# Patient Record
Sex: Male | Born: 1945
Health system: Southern US, Community
[De-identification: ages and names within clinical notes are randomized; demographics above are authoritative.]

## PROBLEM LIST (undated history)

## (undated) DIAGNOSIS — R011 Cardiac murmur, unspecified: Secondary | ICD-10-CM

## (undated) DIAGNOSIS — E119 Type 2 diabetes mellitus without complications: Secondary | ICD-10-CM

## (undated) DIAGNOSIS — I10 Essential (primary) hypertension: Secondary | ICD-10-CM

## (undated) DIAGNOSIS — M1990 Unspecified osteoarthritis, unspecified site: Secondary | ICD-10-CM

## (undated) HISTORY — PX: CATARACT EXTRACTION: SUR2

## (undated) HISTORY — PX: FRACTURE SURGERY: SHX138

## (undated) HISTORY — DX: Unspecified osteoarthritis, unspecified site: M19.90

## (undated) HISTORY — PX: IRIDOTOMY / IRIDECTOMY: SHX165

## (undated) HISTORY — PX: HERNIA REPAIR: SHX51

## (undated) HISTORY — PX: VASECTOMY: SHX75

## (undated) HISTORY — DX: Type 2 diabetes mellitus without complications: E11.9

## (undated) HISTORY — DX: Cardiac murmur, unspecified: R01.1

---

## 1997-07-12 ENCOUNTER — Encounter: Admission: RE | Admit: 1997-07-12 | Discharge: 1997-10-10 | Payer: Self-pay | Admitting: Orthopedic Surgery

## 1997-09-06 ENCOUNTER — Ambulatory Visit (HOSPITAL_COMMUNITY): Admission: RE | Admit: 1997-09-06 | Discharge: 1997-09-06 | Payer: Self-pay | Admitting: Orthopedic Surgery

## 1997-09-25 ENCOUNTER — Encounter: Admission: RE | Admit: 1997-09-25 | Discharge: 1997-12-24 | Payer: Self-pay | Admitting: Orthopedic Surgery

## 1998-04-01 ENCOUNTER — Encounter: Payer: Self-pay | Admitting: Orthopedic Surgery

## 1998-04-02 ENCOUNTER — Ambulatory Visit (HOSPITAL_COMMUNITY): Admission: RE | Admit: 1998-04-02 | Discharge: 1998-04-02 | Payer: Self-pay | Admitting: Orthopedic Surgery

## 1998-08-05 ENCOUNTER — Ambulatory Visit (HOSPITAL_COMMUNITY): Admission: RE | Admit: 1998-08-05 | Discharge: 1998-08-05 | Payer: Self-pay | Admitting: Orthopedic Surgery

## 1998-08-05 ENCOUNTER — Encounter: Payer: Self-pay | Admitting: Orthopedic Surgery

## 1999-01-14 ENCOUNTER — Ambulatory Visit (HOSPITAL_COMMUNITY): Admission: RE | Admit: 1999-01-14 | Discharge: 1999-01-14 | Payer: Self-pay | Admitting: Orthopedic Surgery

## 2005-10-23 ENCOUNTER — Encounter: Admission: RE | Admit: 2005-10-23 | Discharge: 2005-10-23 | Payer: Self-pay | Admitting: Orthopedic Surgery

## 2012-01-27 ENCOUNTER — Ambulatory Visit (INDEPENDENT_AMBULATORY_CARE_PROVIDER_SITE_OTHER): Payer: 59 | Admitting: Family Medicine

## 2012-01-27 VITALS — BP 164/84 | HR 82 | Temp 98.3°F | Resp 16 | Ht 72.0 in | Wt 281.0 lb

## 2012-01-27 DIAGNOSIS — R040 Epistaxis: Secondary | ICD-10-CM

## 2012-01-27 NOTE — Patient Instructions (Addendum)

## 2012-01-27 NOTE — Progress Notes (Signed)
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  He has not had a nose bleed in two decades.  No allergies or high blood pressure, no anticoagulant therapy, no trauma  Objective:  NAD  162/82 Alert, cooperative Left septal clot identified, gently removed, and AgNO3 applied yielding a white eschar over septal arteriole.  AgNO3 reapplied and patient observed x 20 minutes. No further bleeding  Assessment:  Epistaxis, unclear etiology  Plan:  Return as necessary

## 2013-02-27 ENCOUNTER — Ambulatory Visit (INDEPENDENT_AMBULATORY_CARE_PROVIDER_SITE_OTHER): Payer: 59 | Admitting: Family Medicine

## 2013-02-27 VITALS — BP 150/96 | HR 90 | Temp 99.1°F | Resp 16 | Ht 72.0 in | Wt 260.0 lb

## 2013-02-27 DIAGNOSIS — I10 Essential (primary) hypertension: Secondary | ICD-10-CM

## 2013-02-27 DIAGNOSIS — Z23 Encounter for immunization: Secondary | ICD-10-CM

## 2013-02-27 DIAGNOSIS — E669 Obesity, unspecified: Secondary | ICD-10-CM | POA: Insufficient documentation

## 2013-02-27 DIAGNOSIS — M109 Gout, unspecified: Secondary | ICD-10-CM | POA: Insufficient documentation

## 2013-02-27 DIAGNOSIS — R358 Other polyuria: Secondary | ICD-10-CM

## 2013-02-27 DIAGNOSIS — E1169 Type 2 diabetes mellitus with other specified complication: Secondary | ICD-10-CM | POA: Insufficient documentation

## 2013-02-27 DIAGNOSIS — R634 Abnormal weight loss: Secondary | ICD-10-CM

## 2013-02-27 LAB — POCT GLYCOSYLATED HEMOGLOBIN (HGB A1C): Hemoglobin A1C: 11.1

## 2013-02-27 LAB — POCT URINALYSIS DIPSTICK
Bilirubin, UA: NEGATIVE
Glucose, UA: >1000
Ketones, UA: 15
Leukocytes, UA: NEGATIVE
Nitrite, UA: NEGATIVE
Protein, UA: 30
Spec Grav, UA: 1.02
Urobilinogen, UA: 0.2
pH, UA: 5

## 2013-02-27 LAB — COMPREHENSIVE METABOLIC PANEL
AST: 28 U/L (ref 0–37)
Albumin: 4.5 g/dL (ref 3.5–5.2)
Alkaline Phosphatase: 59 U/L (ref 39–117)
BUN: 21 mg/dL (ref 6–23)
CO2: 25 mEq/L (ref 19–32)
Calcium: 9.9 mg/dL (ref 8.4–10.5)
Chloride: 102 mEq/L (ref 96–112)
Glucose, Bld: 316 mg/dL — ABNORMAL HIGH (ref 70–99)
Potassium: 4.4 mEq/L (ref 3.5–5.3)
Sodium: 137 mEq/L (ref 135–145)
Total Protein: 7.8 g/dL (ref 6.0–8.3)

## 2013-02-27 LAB — POCT CBC
Granulocyte percent: 67.2 %G (ref 37–80)
HCT, POC: 50.3 % (ref 43.5–53.7)
Hemoglobin: 16.3 g/dL (ref 14.1–18.1)
Lymph, poc: 1.7 (ref 0.6–3.4)
MCH, POC: 32.5 pg — AB (ref 27–31.2)
MCHC: 32.4 g/dL (ref 31.8–35.4)
MCV: 100.4 fL — AB (ref 80–97)
MID (cbc): 0.3 (ref 0–0.9)
MPV: 7.9 fL (ref 0–99.8)
POC Granulocyte: 4 (ref 2–6.9)
POC LYMPH PERCENT: 27.7 %L (ref 10–50)
POC MID %: 5.1 %M (ref 0–12)
Platelet Count, POC: 237 10*3/uL (ref 142–424)
RBC: 5.01 M/uL (ref 4.69–6.13)
RDW, POC: 12.6 %
WBC: 6 10*3/uL (ref 4.6–10.2)

## 2013-02-27 LAB — LIPID PANEL
HDL: 42 mg/dL (ref >39)
LDL Cholesterol: 75 mg/dL (ref 0–99)
Triglycerides: 227 mg/dL — ABNORMAL HIGH (ref <150)

## 2013-02-27 LAB — POCT SEDIMENTATION RATE: POCT SED RATE: 38 mm/hr — AB (ref 0–22)

## 2013-02-27 LAB — GLUCOSE, POCT (MANUAL RESULT ENTRY): POC Glucose: 299 mg/dl — AB (ref 70–99)

## 2013-02-27 LAB — POCT UA - MICROSCOPIC ONLY
Bacteria, U Microscopic: NEGATIVE
Casts, Ur, LPF, POC: NEGATIVE
Crystals, Ur, HPF, POC: NEGATIVE
Epithelial cells, urine per micros: NEGATIVE
Mucus, UA: NEGATIVE
Yeast, UA: NEGATIVE

## 2013-02-27 LAB — URIC ACID: Uric Acid, Serum: 6.4 mg/dL (ref 4.0–7.8)

## 2013-02-27 LAB — TSH: TSH: 1.633 u[IU]/mL (ref 0.350–4.500)

## 2013-02-27 MED ORDER — INDOMETHACIN 50 MG PO CAPS: 50.0000 mg | ORAL_CAPSULE | Freq: Three times a day (TID) | ORAL | Status: DC

## 2013-02-27 MED ORDER — COLCHICINE 0.6 MG PO TABS: ORAL_TABLET | ORAL | Status: DC

## 2013-02-27 MED ORDER — BLOOD GLUCOSE MONITOR KIT: PACK | Status: DC

## 2013-02-27 MED ORDER — METFORMIN HCL 500 MG PO TABS: 500.0000 mg | ORAL_TABLET | Freq: Two times a day (BID) | ORAL | Status: DC

## 2013-02-27 MED ORDER — GLUCOSE BLOOD VI STRP: ORAL_STRIP | Status: DC

## 2013-02-27 MED ORDER — ASPIRIN EC 81 MG PO TBEC
81.0000 mg | DELAYED_RELEASE_TABLET | Freq: Every day | ORAL | Status: DC
Start: 2013-02-27 — End: 2021-09-12

## 2013-02-27 MED ORDER — LANCETS MISC: Status: DC

## 2013-02-27 NOTE — Patient Instructions (Addendum)
Take the colcrys today. You may also start the indomethacin today and continue this until your gout flair is completely gone - drop down to twice a day instead of three times a day when it has gotten significantly better. THen start the metformin tomorrow morning with breakfast. Both of these medications cause diarrhea but the colcrys diarrhea is worse - I expect the loose stools from metformin, if you have them, should resolve within 1-2 wks. Make an appointment to follow-up with myself or Dr. Neva Seat in less than a month so we can see if we need to increase your metformin or transition you to a different medication. Keep your appointment for your physical with Dr. Neva Seat in Jan in addition. Start taking a baby aspirin every day.  Diabetes and Foot Care Diabetes may cause you to have problems because of poor blood supply (circulation) to your feet and legs. This may cause the skin on your feet to become thinner, break easier, and heal more slowly. Your skin may become dry, and the skin may peel and crack. You may also have nerve damage in your legs and feet causing decreased feeling in them. You may not notice minor injuries to your feet that could lead to infections or more serious problems. Taking care of your feet is one of the most important things you can do for yourself.  HOME CARE INSTRUCTIONS  Wear shoes at all times, even in the house. Do not go barefoot. Bare feet are easily injured.  Check your feet daily for blisters, cuts, and redness. If you cannot see the bottom of your feet, use a mirror or ask someone for help.  Wash your feet with warm water (do not use hot water) and mild soap. Then pat your feet and the areas between your toes until they are completely dry. Do not soak your feet as this can dry your skin.  Apply a moisturizing lotion or petroleum jelly (that does not contain alcohol and is unscented) to the skin on your feet and to dry, brittle toenails. Do not apply lotion  between your toes.  Trim your toenails straight across. Do not dig under them or around the cuticle. File the edges of your nails with an emery board or nail file.  Do not cut corns or calluses or try to remove them with medicine.  Wear clean socks or stockings every day. Make sure they are not too tight. Do not wear knee-high stockings since they may decrease blood flow to your legs.  Wear shoes that fit properly and have enough cushioning. To break in new shoes, wear them for just a few hours a day. This prevents you from injuring your feet. Always look in your shoes before you put them on to be sure there are no objects inside.  Do not cross your legs. This may decrease the blood flow to your feet.  If you find a minor scrape, cut, or break in the skin on your feet, keep it and the skin around it clean and dry. These areas may be cleansed with mild soap and water. Do not cleanse the area with peroxide, alcohol, or iodine.  When you remove an adhesive bandage, be sure not to damage the skin around it.  If you have a wound, look at it several times a day to make sure it is healing.  Do not use heating pads or hot water bottles. They may burn your skin. If you have lost feeling in your feet or legs,  you may not know it is happening until it is too late.  Make sure your health care provider performs a complete foot exam at least annually or more often if you have foot problems. Report any cuts, sores, or bruises to your health care provider immediately. SEEK MEDICAL CARE IF:   You have an injury that is not healing.  You have cuts or breaks in the skin.  You have an ingrown nail.  You notice redness on your legs or feet.  You feel burning or tingling in your legs or feet.  You have pain or cramps in your legs and feet.  Your legs or feet are numb.  Your feet always feel cold. SEEK IMMEDIATE MEDICAL CARE IF:   There is increasing redness, swelling, or pain in or around a  wound.  There is a red line that goes up your leg.  Pus is coming from a wound.  You develop a fever or as directed by your health care provider.  You notice a bad smell coming from an ulcer or wound. Document Released: 03/27/2000 Document Revised: 11/30/2012 Document Reviewed: 09/06/2012 [facility] Patient Information 2014 [place], [place]. Diabetes and Standards of Medical Care  Diabetes is complicated. You may find that your diabetes team includes a dietitian, nurse, diabetes educator, eye doctor, and more. To help everyone know what is going on and to help you get the care you deserve, the following schedule of care was developed to help keep you on track. Below are the tests, exams, vaccines, medicines, education, and plans you will need. HbA1c test This test shows how well you have controlled your glucose over the past 2 3 months. It is used to see if your diabetes management plan needs to be adjusted.   It is performed at least 2 times a year if you are meeting treatment goals.  It is performed 4 times a year if therapy has changed or if you are not meeting treatment goals. Blood pressure test  This test is performed at every routine medical visit. The goal is less than 140/90 mmHg for most people, but 130/80 mmHg in some cases. Ask your health care provider about your goal. Dental exam  Follow up with the dentist regularly. Eye exam  If you are diagnosed with type 1 diabetes as a child, get an exam upon reaching the age of 10 years or older and have had diabetes for 3 5 years. Yearly eye exams are recommended after that initial eye exam.  If you are diagnosed with type 1 diabetes as an adult, get an exam within 5 years of diagnosis and then yearly.  If you are diagnosed with type 2 diabetes, get an exam as soon as possible after the diagnosis and then yearly. Foot care exam  Visual foot exams are performed at every routine medical visit. The exams check for cuts, injuries, or  other problems with the feet.  A comprehensive foot exam should be done yearly. This includes visual inspection as well as assessing foot pulses and testing for loss of sensation.  Check your feet nightly for cuts, injuries, or other problems with your feet. Tell your health care provider if anything is not healing. Kidney function test (urine microalbumin)  This test is performed once a year.  Type 1 diabetes: The first test is performed 5 years after diagnosis.  Type 2 diabetes: The first test is performed at the time of diagnosis.  A serum creatinine and estimated glomerular filtration rate (eGFR) test is  done once a year to assess the level of chronic kidney disease (CKD), if present. Lipid profile (cholesterol, HDL, LDL, triglycerides)  Performed every 5 years for most people.  The goal for LDL is less than 100 mg/dL. If you are at high risk, the goal is less than 70 mg/dL.  The goal for HDL is 40 mg/dL 50 mg/dL for men and 50 mg/dL 60 mg/dL for women. An HDL cholesterol of 60 mg/dL or higher gives some protection against heart disease.  The goal for triglycerides is less than 150 mg/dL. Influenza vaccine, pneumococcal vaccine, and hepatitis B vaccine  The influenza vaccine is recommended yearly.  The pneumococcal vaccine is generally given once in a lifetime. However, there are some instances when another vaccination is recommended. Check with your health care provider.  The hepatitis B vaccine is also recommended for adults with diabetes. Diabetes self-management education  Education is recommended at diagnosis and ongoing as needed. Treatment plan  Your treatment plan is reviewed at every medical visit. Document Released: 01/25/2009 Document Revised: 11/30/2012 Document Reviewed: 08/30/2012 [facility] Patient Information 2014 [place], [place]. Diabetes Meal Planning Guide The diabetes meal planning guide is a tool to help you plan your meals and snacks. It is important for  people with diabetes to manage their blood glucose (sugar) levels. Choosing the right foods and the right amounts throughout your day will help control your blood glucose. Eating right can even help you improve your blood pressure and reach or maintain a healthy weight. CARBOHYDRATE COUNTING MADE EASY When you eat carbohydrates, they turn to sugar. This raises your blood glucose level. Counting carbohydrates can help you control this level so you feel better. When you plan your meals by counting carbohydrates, you can have more flexibility in what you eat and balance your medicine with your food intake. Carbohydrate counting simply means adding up the total amount of carbohydrate grams in your meals and snacks. Try to eat about the same amount at each meal. Foods with carbohydrates are listed below. Each portion below is 1 carbohydrate serving or 15 grams of carbohydrates. Ask your dietician how many grams of carbohydrates you should eat at each meal or snack. Grains and Starches  1 slice bread.   English muffin or hotdog/hamburger bun.   cup cold cereal (unsweetened).   cup cooked pasta or rice.   cup starchy vegetables (corn, potatoes, peas, beans, winter squash).  1 tortilla (6 inches).   bagel.  1 waffle or pancake (size of a CD).   cup cooked cereal.  4 to 6 small crackers. *Whole grain is recommended. Fruit  1 cup fresh unsweetened berries, melon, papaya, pineapple.  1 small fresh fruit.   banana or mango.   cup fruit juice (4 oz unsweetened).   cup canned fruit in natural juice or water.  2 tbs dried fruit.  12 to 15 grapes or cherries. Milk and Yogurt  1 cup fat-free or 1% milk.  1 cup soy milk.  6 oz light yogurt with sugar-free sweetener.  6 oz low-fat soy yogurt.  6 oz plain yogurt. Vegetables  1 cup raw or  cup cooked is counted as 0 carbohydrates or a "free" food.  If you eat 3 or more servings at 1 meal, count them as 1 carbohydrate  serving. Other Carbohydrates   oz chips or pretzels.   cup ice cream or frozen yogurt.   cup sherbet or sorbet.  2 inch square cake, no frosting.  1 tbs honey, sugar, jam, jelly,  or syrup.  2 small cookies.  3 squares of graham crackers.  3 cups popcorn.  6 crackers.  1 cup broth-based soup.  Count 1 cup casserole or other mixed foods as 2 carbohydrate servings.  Foods with less than 20 calories in a serving may be counted as 0 carbohydrates or a "free" food. You may want to purchase a book or computer software that lists the carbohydrate gram counts of different foods. In addition, the nutrition facts panel on the labels of the foods you eat are a good source of this information. The label will tell you how big the serving size is and the total number of carbohydrate grams you will be eating per serving. Divide this number by 15 to obtain the number of carbohydrate servings in a portion. Remember, 1 carbohydrate serving equals 15 grams of carbohydrate. SERVING SIZES Measuring foods and serving sizes helps you make sure you are getting the right amount of food. The list below tells how big or small some common serving sizes are.  1 oz.........4 stacked dice.  3 oz........Marland KitchenDeck of cards.  1 tsp.......Marland KitchenTip of little finger.  1 tbs......Marland KitchenMarland KitchenThumb.  2 tbs.......Marland KitchenGolf ball.   cup......Marland KitchenHalf of a fist.  1 cup.......Marland KitchenA fist. SAMPLE DIABETES MEAL PLAN Below is a sample meal plan that includes foods from the grain and starches, dairy, vegetable, fruit, and meat groups. A dietician can individualize a meal plan to fit your calorie needs and tell you the number of servings needed from each food group. However, controlling the total amount of carbohydrates in your meal or snack is more important than making sure you include all of the food groups at every meal. You may interchange carbohydrate containing foods (dairy, starches, and fruits). The meal plan below is an example of a  2000 calorie diet using carbohydrate counting. This meal plan has 17 carbohydrate servings. Breakfast  1 cup oatmeal (2 carb servings).   cup light yogurt (1 carb serving).  1 cup blueberries (1 carb serving).   cup almonds. Snack  1 large apple (2 carb servings).  1 low-fat string cheese stick. Lunch  Chicken breast salad.  1 cup spinach.   cup chopped tomatoes.  2 oz chicken breast, sliced.  2 tbs low-fat [place] dressing.  12 whole-wheat crackers (2 carb servings).  12 to 15 grapes (1 carb serving).  1 cup low-fat milk (1 carb serving). Snack  1 cup carrots.   cup hummus (1 carb serving). Dinner  3 oz broiled salmon.  1 cup brown rice (3 carb servings). Snack  1  cups steamed broccoli (1 carb serving) drizzled with 1 tsp olive oil and lemon juice.  1 cup light pudding (2 carb servings). DIABETES MEAL PLANNING WORKSHEET Your dietician can use this worksheet to help you decide how many servings of foods and what types of foods are right for you.  BREAKFAST Food Group and Servings / Carb Servings Grain/Starches __________________________________ Dairy __________________________________________ Vegetable ______________________________________ Fruit ___________________________________________ Meat __________________________________________ Fat ____________________________________________ LUNCH Food Group and Servings / Carb Servings Grain/Starches ___________________________________ Dairy ___________________________________________ Fruit ____________________________________________ Meat ___________________________________________ Fat _____________________________________________ Laural Golden Food Group and Servings / Carb Servings Grain/Starches ___________________________________ Dairy ___________________________________________ Fruit ____________________________________________ Meat ___________________________________________ Fat  _____________________________________________ SNACKS Food Group and Servings / Carb Servings Grain/Starches ___________________________________ Dairy ___________________________________________ Vegetable _______________________________________ Fruit ____________________________________________ Meat ___________________________________________ Fat _____________________________________________ DAILY TOTALS Starches _________________________ Vegetable ________________________ Fruit ____________________________ Dairy ____________________________ Meat ____________________________ Fat ______________________________ Document Released: 12/25/2004 Document Revised: 06/22/2011 Document Reviewed: 11/05/2008 [employer] Patient Information 2014 [place]. Diabetes, Eating Away  From Home Sometimes, you might eat in a restaurant or have meals that are prepared by someone else. You can enjoy eating out. However, the portions in restaurants may be much larger than needed. Listed below are some ideas to help you choose foods that will keep your blood glucose (sugar) in better control.  TIPS FOR EATING OUT  Know your meal plan and how many carbohydrate servings you should have at each meal. You may wish to carry a copy of your meal plan in your purse or wallet. Learn the foods included in each food group.  Make a list of restaurants near you that offer healthy choices. Take a copy of the carry-out menus to see what they offer. Then, you can plan what you will order ahead of time.  Become familiar with serving sizes by practicing them at home using measuring cups and spoons. Once you learn to recognize portion sizes, you will be able to correctly estimate the amount of total carbohydrate you are allowed to eat at the restaurant. Ask for a takeout box if the portion is more than you should have. When your food comes, leave the amount you should have on the plate, and put the rest in the takeout box before  you start eating.  Plan ahead if your mealtime will be different from usual. Check with your caregiver to find out how to time meals and medicine if you are taking insulin.  Avoid high-fat foods, such as fried foods, cream sauces, high-fat salad dressings, or any added butter or margarine.  Do not be afraid to ask questions. Ask your server about the portion size, cooking methods, ingredients and if items can be substituted. Restaurants do not list all available items on the menu. You can ask for your main entree to be prepared using skim milk, oil instead of butter or margarine, and without gravy or sauces. Ask your waiter or waitress to serve salad dressings, gravy, sauces, margarine, and sour cream on the side. You can then add the amount your meal plan suggests.  Add more vegetables whenever possible.  Avoid items that are labeled "jumbo," "giant," "deluxe," or "supersized."  You may want to split an entre with someone and order an extra side salad.  Watch for hidden calories in foods like croutons, bacon, or cheese.  Ask your server to take away the bread basket or chips from your table.  Order a dinner salad as an appetizer. You can eat most foods served in a restaurant. Some foods are better choices than others. Breads and Starches  Recommended: All kinds of bread (wheat, rye, white, oatmeal, [place], [place], raisin), hard or soft dinner rolls, frankfurter or hamburger buns, small bagels, small corn or whole-wheat flour tortillas.  Avoid: Frosted or glazed breads, butter rolls, egg or cheese breads, croissants, sweet rolls, pastries, coffee cake, glazed or frosted doughnuts, muffins. Crackers  Recommended: Animal crackers, graham, rye, saltine, oyster, and matzoth crackers. Bread sticks, melba toast, rusks, pretzels, popcorn (without fat), zwieback toast.  Avoid: High-fat snack crackers or chips. Buttered popcorn. Cereals  Recommended: Hot and cold cereals. Whole grains such  as oatmeal or shredded wheat are good choices.  Avoid: Sugar-coated or granola type cereals. Potatoes/Pasta/Rice/Beans  Recommended: Order baked, boiled, or mashed potatoes, rice or noodles without added fat, whole beans. Order gravies, butter, margarine, or sauces on the side so you can control the amount you add.  Avoid: Hash browns or fried potatoes. Potatoes, pasta, or rice prepared with cream or cheese sauce.  Potato or pasta salads prepared with large amounts of dressing. Fried beans or fried rice. Vegetables  Recommended: Order steamed, baked, boiled, or stewed vegetables without sauces or extra fat. Ask that sauce be served on the side. If vegetables are not listed on the menu, ask what is available.  Avoid: Vegetables prepared with cream, butter, or cheese sauce. Fried vegetables. Salad Bars  Recommended: Many of the vegetables at a salad bar are considered "free." Use lemon juice, vinegar, or low-calorie salad dressing (fewer than 20 calories per serving) as "free" dressings for your salad. Look for salad bar ingredients that have no added fat or sugar such as tomatoes, lettuce, cucumbers, broccoli, carrots, onions, and mushrooms.  Avoid: Prepared salads with large amounts of dressing, such as coleslaw, caesar salad, macaroni salad, bean salad, or carrot salad. Fruit  Recommended: Eat fresh fruit or fresh fruit salad without added dressing. A salad bar often offers fresh fruit choices, but canned fruit at a restaurant is usually packed in sugar or syrup.  Avoid: Sweetened canned or frozen fruits, plain or sweetened fruit juice. Fruit salads with dressing, sour cream, or sugar added to them. Meat and Meat Substitutes  Recommended: Order broiled, baked, roasted, or grilled meat, poultry, or fish. Trim off all visible fat. Do not eat the skin of poultry. The size stated on the menu is the raw weight. Meat shrinks by  in cooking (for example, 4 oz raw equals 3 oz cooked  meat).  Avoid: Deep-fat fried meat, poultry, or fish. Breaded meats. Eggs  Recommended: Order soft, hard-cooked, poached, or scrambled eggs. Omelets may be okay, depending on what ingredients are added. Egg substitutes are also a good choice.  Avoid: Fried eggs, eggs prepared with cream or cheese sauce. Milk  Recommended: Order low-fat or fat-free milk according to your meal plan. Plain, nonfat yogurt or flavored yogurt with no sugar added may be used as a substitute for milk. Soy milk may also be used.  Avoid: Milk shakes or sweetened milk beverages. Soups and Combination Foods  Recommended: Clear broth or consomm are "free" foods and may be used as an appetizer. Broth-based soups with fat removed count as a starch serving and are preferred over cream soups. Soups made with beans or split peas may be eaten but count as a starch.  Avoid: Fatty soups, soup made with cream, cheese soup. Combination foods prepared with excessive amounts of fat or with cream or cheese sauces. Desserts and Sweets  Recommended: Ask for fresh fruit. Sponge or angel food cake without icing, ice milk, no sugar added ice cream, sherbet, or frozen yogurt may fit into your meal plan occasionally.  Avoid: Pastries, puddings, pies, cakes with icing, custard, gelatin desserts. Fats and Oils  Recommended: Choose healthy fats such as olive oil, canola oil, or tub margarine, reduced fat or fat-free sour cream, cream cheese, avocado, or nuts.  Avoid: Any fats in excess of your allowed portion. Deep-fried foods or any food with a large amount of fat. Note: Ask for all fats to be served on the side, and limit your portion sizes according to your meal plan. Document Released: 03/30/2005 Document Revised: 06/22/2011 Document Reviewed: 10/18/2008 [facility] Patient Information 2014 [place], [place]. Blood Glucose Monitoring, Adult Monitoring your blood glucose (also know as blood sugar) helps you to manage your diabetes. It  also helps you and your health care provider monitor your diabetes and determine how well your treatment plan is working. WHY SHOULD YOU MONITOR YOUR BLOOD GLUCOSE?  It  can help you understand how food, exercise, and medicine affect your blood glucose.  It allows you to know what your blood glucose is at any given moment. You can quickly tell if you are having low blood glucose (hypoglycemia) or high blood glucose (hyperglycemia).  It can help you and your health care provider know how to adjust your medicines.  It can help you understand how to manage an illness or adjust medicine for exercise. WHEN SHOULD YOU TEST? Your health care provider will help you decide how often you should check your blood glucose. This may depend on the type of diabetes you have, your diabetes control, or the types of medicines you are taking. Be sure to write down all of your blood glucose readings so that this information can be reviewed with your health care provider. See below for examples of testing times that your health care provider may suggest. Type 1 Diabetes  Test 4 times a day if you are in good control, using an insulin pump, or perform multiple daily injections.  If your diabetes is not well-controlled or if you are sick, you may need to monitor more often.  It is a good idea to also monitor:  Before and after exercise.  Between meals and 2 hours after a meal.  Occasionally between 2:00 to 3:00 am. Type 2 Diabetes  It can vary with each person, but generally, if you are on insulin, test 4 times a day.  If you take medicines by mouth (orally), test 2 times a day.  If you are on a controlled diet, test once a day.  If your diabetes is not well controlled or if you are sick, you may need to monitor more often. HOW TO MONITOR YOUR BLOOD GLUCOSE Supplies Needed  Blood glucose meter.  Test strips for your meter. Each meter has its own strips. You must use the strips that go with your own  meter.  A pricking needle (lancet).  A device that holds the lancet (lancing device).  A journal or log book to write down your results. Procedure  Wash your hands with soap and water. Alcohol is not preferred.  Prick the side of your finger (not the tip) with the lancet.  Gently milk the finger until a small drop of blood appears.  Follow the instructions that come with your meter for inserting the test strip, applying blood to the strip, and using your blood glucose meter. Other Areas to Get Blood for Testing Some meters allow you to use other areas of your body (other than your finger) to test your blood. These areas are called alternative sites. The most common alternative sites are:  The forearm.  The thigh.  The back area of the lower leg.  The palm of the hand. The blood flow in these areas is slower. Therefore, the blood glucose values you get may be delayed, and the numbers are different from what you would get from your fingers. Do not use alternative sites if you think you are having hypoglycemia. Your reading will not be accurate. Always use a finger if you are having hypoglycemia. Also, if you cannot feel your lows (hypoglycemia unawareness), always use your fingers for your blood glucose checks. ADDITIONAL TIPS FOR GLUCOSE MONITORING  Do not reuse lancets.  Always carry your supplies with you.  All blood glucose meters have a 24-hour "hotline" number to call if you have questions or need help.  Adjust (calibrate) your blood glucose meter with a control solution  after finishing a few boxes of strips. BLOOD GLUCOSE RECORD KEEPING It is a good idea to keep a daily record or log of your blood glucose readings. Most glucose meters, if not all, keep your glucose records stored in the meter. Some meters come with the ability to download your records to your home computer. Keeping a record of your blood glucose readings is especially helpful if you are wanting to look for  patterns. Make notes to go along with the blood glucose readings because you might forget what happened at that exact time. Keeping good records helps you and your health care provider to work together to achieve good diabetes management.  Document Released: 04/02/2003 Document Revised: 11/30/2012 Document Reviewed: 08/22/2012 [facility] Patient Information 2014 [place], [place].

## 2013-02-27 NOTE — Progress Notes (Addendum)
Subjective:    Patient ID: Jeffery George, male    DOB: 01-01-1946, 67 y.o.   MRN: [id number]  This chart was scribed for Sherren Mocha, MD by Dorothey Baseman, ED Scribe.   Chief Complaint  Patient presents with  . Foot Swelling    x 8 days (R) foot   HPI Jeffery George is a 67 y.o. Male with a history of gout who presents to [facility] complaining of swelling with associated erythema and pain to the right foot first toe onset 8 days ago. He states that he has gout flare-ups every few months that usually last about 2 days and treated at home with ibuprofen. He states that he follows the associated dietary guidelines for gout. He states that he has not been treated with colchicine in the past for his gout. Patient denies any potential injury or trauma to the area. Patient reports that he does not currently have a PCP, but has an appointment for CPE to establish with Jeffery George on 05/01/2013.   Patient also reports persistent thirst with associated decreased appetite and urinary frequency for the past few weeks. Patient reports that he recently lost 15-20 pounds in the past few months unintentionally. He denies any dysuria.   Patient states that he does not regularly check his blood pressure outside of the office.  Walks w/ a cane due to left foot arthritis - chronic.   Past Medical History  Diagnosis Date  . Arthritis    Current Outpatient Prescriptions on File Prior to Visit  Medication Sig Dispense Refill  . ibuprofen (ADVIL,MOTRIN) 200 MG tablet Take 400 mg by mouth every 6 (six) hours as needed.       No current facility-administered medications on file prior to visit.   No Known Allergies  Review of Systems  Constitutional: Positive for activity change, appetite change, fatigue and unexpected weight change. Negative for fever, chills and diaphoresis.  Cardiovascular: Negative for leg swelling.  Endocrine: Positive for polydipsia and polyuria. Negative for  polyphagia.  Genitourinary: Positive for urgency and frequency. Negative for dysuria, decreased urine volume and difficulty urinating.  Musculoskeletal: Positive for arthralgias, gait problem and joint swelling. Negative for myalgias.  Skin: Positive for color change ( erythema). Negative for pallor, rash and wound.  Hematological: Negative for adenopathy. Does not bruise/bleed easily.  Psychiatric/Behavioral: Positive for sleep disturbance.     BP 150/96  Pulse 90  Temp(Src) 99.1 F (37.3 C) (Oral)  Resp 16  Ht 6' (1.829 m)  Wt 260 lb (117.935 kg)  BMI 35.25 kg/m2  SpO2 97%  Objective:   Physical Exam  Nursing note and vitals reviewed. Constitutional: He is oriented to person, place, and time. He appears well-developed and well-nourished. No distress.  HENT:  Head: Normocephalic and atraumatic.  Eyes: Conjunctivae are normal.  Neck: Normal range of motion. Neck supple.  Cardiovascular: Normal rate, regular rhythm and normal heart sounds.  Exam reveals no gallop and no friction rub.   No murmur heard. Pulmonary/Chest: Effort normal and breath sounds normal. No respiratory distress. He has no wheezes. He has no rales.  Abdominal: He exhibits no distension.  Musculoskeletal: He exhibits edema.  Swelling, erythema, and hyperesthesia to the first MTP joint on the right. Moderate range of motion to the MTP joint. No joint warmth. Hypertrophic, thick yellow nail.  Neurological: He is alert and oriented to person, place, and time. Gait abnormal.  Walks w/ cane at baseline  Skin: Skin is warm  and dry.  Psychiatric: He has a normal mood and affect. His behavior is normal.      Results for orders placed in visit on 02/27/13  COMPREHENSIVE METABOLIC PANEL      Result Value Range   Sodium 137  135 - 145 mEq/L   Potassium 4.4  3.5 - 5.3 mEq/L   Chloride 102  96 - 112 mEq/L   CO2 25  19 - 32 mEq/L   Glucose, Bld 316 (*) 70 - 99 mg/dL   BUN 21  6 - 23 mg/dL   Creat 1.61  0.96 - 0.45  mg/dL   Total Bilirubin 0.6  0.3 - 1.2 mg/dL   Alkaline Phosphatase 59  39 - 117 U/L   AST 28  0 - 37 U/L   ALT 39  0 - 53 U/L   Total Protein 7.8  6.0 - 8.3 g/dL   Albumin 4.5  3.5 - 5.2 g/dL   Calcium 9.9  8.4 - 40.9 mg/dL  LIPID PANEL      Result Value Range   Cholesterol 162  0 - 200 mg/dL   Triglycerides 811 (*) <150 mg/dL   HDL 42  >91 mg/dL   Total CHOL/HDL Ratio 3.9     VLDL 45 (*) 0 - 40 mg/dL   LDL Cholesterol 75  0 - 99 mg/dL  TSH      Result Value Range   TSH 1.633  0.350 - 4.500 uIU/mL  URIC ACID      Result Value Range   Uric Acid, Serum 6.4  4.0 - 7.8 mg/dL  POCT CBC      Result Value Range   WBC 6.0  4.6 - 10.2 K/uL   Lymph, poc 1.7  0.6 - 3.4   POC LYMPH PERCENT 27.7  10 - 50 %L   MID (cbc) 0.3  0 - 0.9   POC MID % 5.1  0 - 12 %M   POC Granulocyte 4.0  2 - 6.9   Granulocyte percent 67.2  37 - 80 %G   RBC 5.01  4.69 - 6.13 M/uL   Hemoglobin 16.3  14.1 - 18.1 g/dL   HCT, POC 47.8  29.5 - 53.7 %   MCV 100.4 (*) 80 - 97 fL   MCH, POC 32.5 (*) 27 - 31.2 pg   MCHC 32.4  31.8 - 35.4 g/dL   RDW, POC 62.1     Platelet Count, POC 237  142 - 424 K/uL   MPV 7.9  0 - 99.8 fL  GLUCOSE, POCT (MANUAL RESULT ENTRY)      Result Value Range   POC Glucose 299 (*) 70 - 99 mg/dl  POCT GLYCOSYLATED HEMOGLOBIN (HGB A1C)      Result Value Range   Hemoglobin A1C 11.1    POCT SEDIMENTATION RATE      Result Value Range   POCT SED RATE 38 (*) 0 - 22 mm/hr  POCT UA - MICROSCOPIC ONLY      Result Value Range   WBC, Ur, HPF, POC 0-2     RBC, urine, microscopic 0-4     Bacteria, U Microscopic neg     Mucus, UA neg     Epithelial cells, urine per micros neg     Crystals, Ur, HPF, POC neg     Casts, Ur, LPF, POC neg     Yeast, UA neg    POCT URINALYSIS DIPSTICK      Result Value Range   Color, UA  yellow     Clarity, UA clear     Glucose, UA >1000     Bilirubin, UA neg     Ketones, UA 15     Spec Grav, UA 1.020     Blood, UA trace-intact     pH, UA 5.0     Protein,  UA 30     Urobilinogen, UA 0.2     Nitrite, UA neg     Leukocytes, UA Negative     Assessment & Plan:   Gout/Podagra - Plan: POCT CBC, POCT glucose (manual entry), POCT glycosylated hemoglobin (Hb A1C), POCT SEDIMENTATION RATE, POCT UA - Microscopic Only, POCT urinalysis dipstick, Comprehensive metabolic panel, Lipid panel, TSH, Uric acid - cannot treat w/ prednisone due to elev glucose so try colchicine today and start indomethacin - d/c ibuprofen.  Essential hypertension, benign - Plan: POCT CBC, POCT glucose (manual entry), POCT glycosylated hemoglobin (Hb A1C), POCT SEDIMENTATION RATE, POCT UA - Microscopic Only, POCT urinalysis dipstick, Comprehensive metabolic panel, Lipid panel, TSH, Uric acid  Polyuria/Unintentional weight loss/Obesity (BMI 30-39.9)- Plan: POCT CBC, POCT glucose (manual entry), POCT glycosylated hemoglobin (Hb A1C), POCT SEDIMENTATION RATE, POCT UA - Microscopic Only, POCT urinalysis dipstick, Comprehensive metabolic panel, Lipid panel, TSH, Uric acid - all due to new diagnosis DM  Type II or unspecified type diabetes mellitus without mention of complication, uncontrolled - Plan: Ambulatory referral to diabetic education - new diagnosis today of type II DM. Start metformin 500mg  bid cc. Start asa 81 mg qd. Sent rx for meter, lancets, strips to pharmacy. Pt should start checking cbgs qam fasting and 2 hrs after dinner - record and bring to f/u - recheck in 1 mo to review cbgs and see if metformin needs to be titrated up.  Gave dietary info and referred to diabetic ed.  Check urine microalb at f/u.  Need for prophylactic vaccination and inoculation against influenza - Plan: Flu Vaccine QUAD 36+ mos IM  Meds ordered this encounter  Medications  . Blood Glucose Monitoring Suppl (BLOOD GLUCOSE MONITOR KIT) KIT    Sig: Use to test blood sugar twice daily.    Dispense:  1 each    Refill:  0    Monitor of pt's choice  . glucose blood test strip    Sig: Use to test blood  sugar twice daily.    Dispense:  200 each    Refill:  3    Pt's choice  . Lancets MISC    Sig: Use to test blood sugar twice daily    Dispense:  200 each    Refill:  3  . metFORMIN (GLUCOPHAGE) 500 MG tablet    Sig: Take 1 tablet (500 mg total) by mouth 2 (two) times daily with a meal.    Dispense:  60 tablet    Refill:  0  . indomethacin (INDOCIN) 50 MG capsule    Sig: Take 1 capsule (50 mg total) by mouth 3 (three) times daily with meals.    Dispense:  60 capsule    Refill:  0  . colchicine 0.6 MG tablet    Sig: Take 2 tabs po x 1 then a 3rd tab po 1 hr later.    Dispense:  3 tablet    Refill:  4  . aspirin EC 81 MG tablet    Sig: Take 1 tablet (81 mg total) by mouth daily.    I personally performed the services described in this documentation, which was scribed in my presence. The  recorded information has been reviewed and considered, and addended by me as needed.  Norberto Sorenson, MD MPH

## 2013-02-28 ENCOUNTER — Encounter: Payer: Self-pay | Admitting: Family Medicine

## 2013-03-23 ENCOUNTER — Telehealth: Payer: Self-pay

## 2013-03-23 NOTE — Telephone Encounter (Signed)
Pt requests refill on metformin and indomethacin. States he has an appt on 03/31/13 but doesn't have enough medicine to last until then.  Pharmacy: [facility] [address]  [place]

## 2013-03-24 MED ORDER — METFORMIN HCL 500 MG PO TABS: 500.0000 mg | ORAL_TABLET | Freq: Two times a day (BID) | ORAL | Status: DC

## 2013-03-24 MED ORDER — INDOMETHACIN 50 MG PO CAPS: 50.0000 mg | ORAL_CAPSULE | Freq: Three times a day (TID) | ORAL | Status: DC

## 2013-03-24 NOTE — Telephone Encounter (Signed)
Sent metformin, please advise on Indocin. pended

## 2013-03-24 NOTE — Telephone Encounter (Signed)
Sent Rx to the pharmacy 

## 2013-03-31 ENCOUNTER — Ambulatory Visit (INDEPENDENT_AMBULATORY_CARE_PROVIDER_SITE_OTHER): Payer: 59 | Admitting: Family Medicine

## 2013-03-31 VITALS — BP 150/75 | HR 66 | Temp 98.6°F | Resp 16 | Ht 72.5 in | Wt 261.0 lb

## 2013-03-31 DIAGNOSIS — E119 Type 2 diabetes mellitus without complications: Secondary | ICD-10-CM

## 2013-03-31 DIAGNOSIS — I10 Essential (primary) hypertension: Secondary | ICD-10-CM

## 2013-03-31 DIAGNOSIS — M1990 Unspecified osteoarthritis, unspecified site: Secondary | ICD-10-CM

## 2013-03-31 DIAGNOSIS — R011 Cardiac murmur, unspecified: Secondary | ICD-10-CM

## 2013-03-31 DIAGNOSIS — Z79899 Other long term (current) drug therapy: Secondary | ICD-10-CM

## 2013-03-31 LAB — BASIC METABOLIC PANEL
BUN: 25 mg/dL — ABNORMAL HIGH (ref 6–23)
Calcium: 9.2 mg/dL (ref 8.4–10.5)
Creat: 0.81 mg/dL (ref 0.50–1.35)
Glucose, Bld: 99 mg/dL (ref 70–99)
Sodium: 137 mEq/L (ref 135–145)

## 2013-03-31 LAB — MICROALBUMIN / CREATININE URINE RATIO
Creatinine, Urine: 187.3 mg/dL
Microalb Creat Ratio: 5 mg/g (ref 0.0–30.0)
Microalb, Ur: 0.93 mg/dL (ref 0.00–1.89)

## 2013-03-31 MED ORDER — METFORMIN HCL 500 MG PO TABS: 500.0000 mg | ORAL_TABLET | Freq: Two times a day (BID) | ORAL | Status: DC

## 2013-03-31 MED ORDER — LISINOPRIL 10 MG PO TABS: 10.0000 mg | ORAL_TABLET | Freq: Every day | ORAL | Status: DC

## 2013-03-31 MED ORDER — INDOMETHACIN 50 MG PO CAPS: 50.0000 mg | ORAL_CAPSULE | Freq: Three times a day (TID) | ORAL | Status: DC

## 2013-03-31 NOTE — Progress Notes (Signed)
Subjective:    Patient ID: Jeffery George, male    DOB: [birth year], 67 y.o.   MRN: [id number] Chief Complaint  Patient presents with  . Diabetes    follow up    HPI  Has changed his diet dramatically.  Not eating ANYTHING that is fried - mainly boiled or baked chicken, a lot of salads, everything is low fat. 2 Boiled eggs and a glass of mild for breakfast today. Eating 4-5xd. His wife and daughter are vegetarian - he is the only meat eater in his house. Doesn't like fish unless its fried so getting rather tired of chicken.  No prob w/ the metformin - tolerating it very well.  Brought in log of bid cbgs. Initially were in the 300s - then decreased within 10d of starting metformin - now cbgs are 90-130s, occ 140s fasting in a.m.  cbgs in the evening are 100-150  Sleeping better - was not able to get over an hr/night prior due to polyuria, polydipsia, and ankle pain.  Huge improvement in left ankle pain w/ indomethacin - he broke his L ankle in 7 places falling off of a house 12 yrs ago. THen he lost his job 2 yrs later so was unable to get any proper f/u care for his ankle. Has lives w/ L ankle swelling and pain daily for the past 10 yrs - if he is on his feet all day his is limping and in tears by the end of the day.  Since starting the indomethacin last month his left ankle feels like it has been a huge transformation - able to sleep at night, able to work, was able to get house projects done that he has put off for years due to decrease in pain.  No prob w/ gout since our last visit - colchicine was very expensive but worth it - knocked the gout prob out almost immed. Has refills on the colchicine.  Can't believe how much better he feels - feels like a new person - 15 yrs younger.  Has an appt sched w/ Dr. Neva Seat for a CPE in 1 mo - wanted to see a male provider for this.  Past Medical History  Diagnosis Date  . Arthritis    Current Outpatient Prescriptions on File Prior to Visit    Medication Sig Dispense Refill  . aspirin EC 81 MG tablet Take 1 tablet (81 mg total) by mouth daily.      . Blood Glucose Monitoring Suppl (BLOOD GLUCOSE MONITOR KIT) KIT Use to test blood sugar twice daily.  1 each  0  . colchicine 0.6 MG tablet Take 2 tabs po x 1 then a 3rd tab po 1 hr later.  3 tablet  4  . glucose blood test strip Use to test blood sugar twice daily.  200 each  3  . Lancets MISC Use to test blood sugar twice daily  200 each  3   No current facility-administered medications on file prior to visit.   No Known Allergies   Review of Systems  Constitutional: Negative for fever, chills, diaphoresis, activity change, appetite change and fatigue.  Eyes: Negative for visual disturbance.  Respiratory: Negative for shortness of breath.   Cardiovascular: Negative for chest pain and leg swelling.  Gastrointestinal: Negative for nausea and diarrhea.  Musculoskeletal: Positive for arthralgias and joint swelling. Negative for gait problem and myalgias.  Skin: Positive for rash. Negative for wound.  Neurological: Negative for dizziness, syncope, facial asymmetry, weakness, light-headedness,  numbness and headaches.  Hematological: Negative for adenopathy. Does not bruise/bleed easily.  Psychiatric/Behavioral: Negative for sleep disturbance and dysphoric mood. The patient is not nervous/anxious.       BP 150/75  Pulse 66  Temp(Src) 98.6 F (37 C)  Resp 16  Ht 6' 0.5" (1.842 m)  Wt 261 lb (118.389 kg)  BMI 34.89 kg/m2 Objective:   Physical Exam  Constitutional: He is oriented to person, place, and time. He appears well-developed and well-nourished. No distress.  HENT:  Head: Normocephalic and atraumatic.  Eyes: Conjunctivae are normal. Pupils are equal, round, and reactive to light. No scleral icterus.  Neck: Normal range of motion. Neck supple. No thyromegaly present.  Cardiovascular: Normal rate, regular rhythm, S2 normal and intact distal pulses.   Murmur heard.   Systolic murmur is present with a grade of 2/6  Pulses:      Dorsalis pedis pulses are 2+ on the right side, and 2+ on the left side.       Posterior tibial pulses are 2+ on the right side, and 2+ on the left side.  Murmur heard best over RUSB.  Pulmonary/Chest: Effort normal and breath sounds normal. No respiratory distress.  Musculoskeletal: He exhibits no edema.       Left ankle: He exhibits decreased range of motion, swelling and deformity.  Lymphadenopathy:    He has no cervical adenopathy.  Neurological: He is alert and oriented to person, place, and time.  Skin: Skin is warm and dry. Rash noted. Rash is maculopapular. He is not diaphoretic.  Hyperpigmented rash on feet  Psychiatric: He has a normal mood and affect. His behavior is normal.          Assessment & Plan:  Diabetes mellitus - Plan: Basic metabolic panel, Microalbumin/Creatinine Ratio, Urine - brought log of bid cbgs - look FANTASTIC - esp w the past wk has been almost entirely at goal - around 90-100 qam and 100-120 qhs.  Consider recheck a1c at f/u in 1 month (though will be only 2 mos since diagnosis.) Then could have DM f/u in 3-4 mos.  Plans to see optho next mo. Lipid panel good - trig mildly elev prior but LDL and non-HDL at goal. Recheck flp at CPE next mo as pt has been working hard on low-fat/chol diet since his diagnosis.  Has not made appt w/ DM ed yet but read all of handouts and have made HUGE lifestyle changes.  Arthritis - Responded beautifully to indomethacin so ok to cont - will just want to keep a close eye on renal fxn.  Essential hypertension, benign - Plan: Basic metabolic panel, Microalbumin/Creatinine Ratio, Urine - improving w/ lifestyle changes but will go ahead and start lisinopril 10 today - recheck bmp at f/u w/ Dr. Chilton Si in 1 mo for CPE.  Undiagnosed cardiac murmurs - not noted prior but pt has not had much reg medical care prior -  New to our clinic. Sounds functional and pt has not had  anything to drink today so advised to push fluids then have Dr. Chilton Si recheck at his physical in 1 mo. If persists, consider echo.  HM - has CPE sched w/ Dr. Chilton Si next mo.  Meds ordered this encounter  Medications  . metFORMIN (GLUCOPHAGE) 500 MG tablet    Sig: Take 1 tablet (500 mg total) by mouth 2 (two) times daily with a meal.    Dispense:  180 tablet    Refill:  1  . indomethacin (INDOCIN) 50 MG  capsule    Sig: Take 1 capsule (50 mg total) by mouth 3 (three) times daily with meals.    Dispense:  180 capsule    Refill:  1  . lisinopril (PRINIVIL,ZESTRIL) 10 MG tablet    Sig: Take 1 tablet (10 mg total) by mouth daily.    Dispense:  90 tablet    Refill:  1    Norberto Sorenson, MD MPH

## 2013-03-31 NOTE — Addendum Note (Signed)
Addended by: Norberto Sorenson on: 03/31/2013 02:54 PM   Modules accepted: Orders

## 2013-04-01 ENCOUNTER — Encounter: Payer: Self-pay | Admitting: Family Medicine

## 2013-05-01 ENCOUNTER — Ambulatory Visit (INDEPENDENT_AMBULATORY_CARE_PROVIDER_SITE_OTHER): Payer: BC Managed Care – PPO | Admitting: Family Medicine

## 2013-05-01 ENCOUNTER — Encounter: Payer: Self-pay | Admitting: Family Medicine

## 2013-05-01 VITALS — BP 172/86 | HR 64 | Temp 99.2°F | Resp 16 | Ht 72.0 in | Wt 254.2 lb

## 2013-05-01 DIAGNOSIS — R972 Elevated prostate specific antigen [PSA]: Secondary | ICD-10-CM

## 2013-05-01 DIAGNOSIS — R011 Cardiac murmur, unspecified: Secondary | ICD-10-CM

## 2013-05-01 DIAGNOSIS — Z23 Encounter for immunization: Secondary | ICD-10-CM

## 2013-05-01 DIAGNOSIS — IMO0001 Reserved for inherently not codable concepts without codable children: Secondary | ICD-10-CM

## 2013-05-01 DIAGNOSIS — E1165 Type 2 diabetes mellitus with hyperglycemia: Secondary | ICD-10-CM

## 2013-05-01 DIAGNOSIS — R195 Other fecal abnormalities: Secondary | ICD-10-CM

## 2013-05-01 DIAGNOSIS — I10 Essential (primary) hypertension: Secondary | ICD-10-CM

## 2013-05-01 DIAGNOSIS — Z0000 Encounter for general adult medical examination without abnormal findings: Secondary | ICD-10-CM

## 2013-05-01 LAB — LIPID PANEL
CHOL/HDL RATIO: 3.2 ratio
Cholesterol: 123 mg/dL (ref 0–200)
HDL: 39 mg/dL — ABNORMAL LOW (ref >39)
LDL Cholesterol: 66 mg/dL (ref 0–99)
TRIGLYCERIDES: 89 mg/dL (ref <150)
VLDL: 18 mg/dL (ref 0–40)

## 2013-05-01 LAB — IFOBT (OCCULT BLOOD): IFOBT: POSITIVE

## 2013-05-01 MED ORDER — ZOSTER VACCINE LIVE 19400 UNT/0.65ML ~~LOC~~ SOLR: 0.6500 mL | Freq: Once | SUBCUTANEOUS | Status: DC

## 2013-05-01 NOTE — Patient Instructions (Addendum)
You should receive a call or letter about your lab results within the next week to 10 days.  Keep a record of your blood pressures outside of the office and bring them to the next office visit in the next month.  Follow up in one month to discuss diabetes and other medications. Take the prescription for the shingles vaccine to your pharmacy to have this delivered. We will refer you for an echocardiogram to look further at the heart murmur, but this can be discussed in follow up as well to determine if you would need to see a Cardiologist. Follow up in one month with myself of Dr. Brigitte Pulse for recheck. Return to the clinic or go to the nearest emergency room if any of your symptoms worsen or new symptoms occur. We can do an EKG at your next visit as well.  For the ankle pain, try to decease to once per day on your indomethacin as this can cause your blood pressure to elevate.  Tylenol is ok to take as well.  If your blood pressures remain over 140/90 outside the office in then next 2 weeks - return sooner to discuss blood pressure.     Keeping you healthy  Get these tests  Blood pressure- Have your blood pressure checked once a year by your healthcare provider.  Normal blood pressure is 120/80  Weight- Have your body mass index (BMI) calculated to screen for obesity.  BMI is a measure of body fat based on height and weight. You can also calculate your own BMI at [email or web address].  Cholesterol- Have your cholesterol checked every year.  Diabetes- Have your blood sugar checked regularly if you have high blood pressure, high cholesterol, have a family history of diabetes or if you are overweight.  Screening for Colon Cancer- Colonoscopy starting at age 37.  Screening may begin sooner depending on your family history and other health conditions. Follow up colonoscopy as directed by your Gastroenterologist.  Screening for Prostate Cancer- Both blood work (PSA) and a rectal exam help screen for  Prostate Cancer.  Screening begins at age 66 with African-American men and at age 92 with Caucasian men.  Screening may begin sooner depending on your family history.  Take these medicines  Aspirin- One aspirin daily can help prevent Heart disease and Stroke.  Flu shot- Every fall.  Tetanus- Every 10 years.  Zostavax- Once after the age of 16 to prevent Shingles.  Pneumonia shot- Once after the age of 25; if you are younger than 44, ask your healthcare provider if you need a Pneumonia shot.  Take these steps  Don't smoke- If you do smoke, talk to your doctor about quitting.  For tips on how to quit, go to www.smokefree.gov or call 1-800-QUIT-NOW.  Be physically active- Exercise 5 days a week for at least 30 minutes.  If you are not already physically active start slow and gradually work up to 30 minutes of moderate physical activity.  Examples of moderate activity include walking briskly, mowing the yard, dancing, swimming, bicycling, etc.  Eat a healthy diet- Eat a variety of healthy food such as fruits, vegetables, low fat milk, low fat cheese, yogurt, lean meant, poultry, fish, beans, tofu, etc. For more information go to www.thenutritionsource.org  Drink alcohol in moderation- Limit alcohol intake to less than two drinks a day. Never drink and drive.  Dentist- Brush and floss twice daily; visit your dentist twice a year.  Depression- Your emotional health is as important as your physical  health. If you're feeling down, or losing interest in things you would normally enjoy please talk to your healthcare provider.  Eye exam- Visit your eye doctor every year.  Safe sex- If you may be exposed to a sexually transmitted infection, use a condom.  Seat belts- Seat belts can save your life; always wear one.  Smoke/Carbon Monoxide detectors- These detectors need to be installed on the appropriate level of your home.  Replace batteries at least once a year.  Skin cancer- When out in the  sun, cover up and use sunscreen 15 SPF or higher.  Violence- If anyone is threatening you, please tell your healthcare provider.  Living Will/ Health care power of attorney- Speak with your healthcare provider and family.

## 2013-05-01 NOTE — Progress Notes (Addendum)
Subjective:    Patient ID: Jeffery George, male    DOB: [birth year], 68 y.o.   MRN: [id number]  This chart was scribed for [name], MD by Maree Erie, ED Scribe. The patient was seen in room 21. Patient's care was started at [place] [facility]. Authored by Janeann Forehand, MD - unable to change in [facility].   Chief Complaint  Patient presents with   Annual Exam   PCP: Delman Cheadle, MD   HPI  Jeffery George is a 68 y.o. male who presents to office for a complete annual physical exam. Last seen by Dr. Brigitte Pulse on 03/31/13. Discussed gout, diabetes, hypertension. Also noted heart murmur at last office visit with plan to recheck today, as well as recheck lipid panel.   DM II: New diagnosis of diabetes in 02/2013 with initial A1C of 11.1. On 500 mg Metformin BID. Blood sugar at last office visits from 90-130 in morning, 100-150 at night. Improved diet. Over last ten days blood sugar has ranged from 80-109 via self recorded log. Initial recordings in November were in high 200s. He states that he has been eating better but has not been able to do much walking due to trouble with his left ankle. He was having urinary frequency, getting up five or six times a night that has decreased down to once a night.  Hypertension: started on Lisinopril 10 mg QD at last office visit for BP of 150/75. He states that his BP today was higher being on the medication then at last check in December. He has not checked his BP outside of the office. He denies headache, lightheadedness, dizziness, or chest pain.   Heart murmur: He states he was told many years ago about the murmur but had not heard anyone mention it again until the visit in December.   Cancer Screening: He denies ever having a colonoscopy. He was last screened for prostate cancer with biopsy about ten years ago due to an elevated PSA level. He states the biopsy came back normal and was not given a specific timeline for follow up. He denies a family history of prostate  cancer.   Vaccinations: He received a flu vaccination this season. He is not up to date on his Tetanus vaccination. He has not had a pneumonia vaccination. He has not received his shingles vaccination. He is open to receiving them today.   Routine Maintenance: Last visit with dentist was a month ago. His last visit with his eye doctor was two and a half years ago. He is going to get it scheduled within the next few months. He denies smoking, alcohol or illegal drug use.    Advanced Directives: He has not heard the term advanced directives before but states that he has talked to his wife about what to happen in regards to medical emergencies. He states he would like to be full code but would not like prolonged life support.  Review of Systems  Cardiovascular: Negative for chest pain.  Genitourinary: Positive for frequency.  Neurological: Negative for dizziness, light-headedness and headaches.       Objective:   Physical Exam  Vitals reviewed. Constitutional: He is oriented to person, place, and time. He appears well-developed and well-nourished.  HENT:  Head: Normocephalic and atraumatic.  Right Ear: External ear normal.  Left Ear: External ear normal.  Mouth/Throat: Oropharynx is clear and moist.  Eyes: Conjunctivae and EOM are normal. Pupils are equal, round, and reactive to light.  Neck: Normal range of  motion. Neck supple. Carotid bruit is not present. No thyromegaly present.  Cardiovascular: Normal rate, regular rhythm and intact distal pulses.   Murmur heard. 2/6 systolic murmur at left upper sternal border.  Pulmonary/Chest: Effort normal and breath sounds normal. No respiratory distress. He has no wheezes.  Abdominal: Soft. He exhibits no distension. There is no tenderness. Hernia confirmed negative in the right inguinal area and confirmed negative in the left inguinal area.  Genitourinary: Prostate is enlarged (diffusely firm, possible enlargement. ). Prostate is not tender.    Musculoskeletal: Normal range of motion. He exhibits no edema and no tenderness.  Normal ROM,except L ankle: well healed scar on medial aspect of left ankle with decreased ROM of ankle.  Lymphadenopathy:    He has no cervical adenopathy.  Neurological: He is alert and oriented to person, place, and time. He has normal reflexes.  Skin: Skin is warm and dry.  Psychiatric: He has a normal mood and affect. His behavior is normal.     Chemistry      Component Value Date/Time   NA 137 03/31/2013 1133   K 4.3 03/31/2013 1133   CL 103 03/31/2013 1133   CO2 25 03/31/2013 1133   BUN 25* 03/31/2013 1133   CREATININE 0.81 03/31/2013 1133      Component Value Date/Time   CALCIUM 9.2 03/31/2013 1133   ALKPHOS 59 02/27/2013 1217   AST 28 02/27/2013 1217   ALT 39 02/27/2013 1217   BILITOT 0.6 02/27/2013 1217       Filed Vitals:   05/01/13 1618  BP: 172/86  Pulse: 64  Temp: 99.2 F (37.3 C)  TempSrc: Oral  Resp: 16  Height: 6' (1.829 m)  Weight: 254 lb 3.2 oz (115.304 kg)  SpO2: 97%   Results for orders placed in visit on 05/01/13  IFOBT (OCCULT BLOOD)      Result Value Range   IFOBT Positive        Assessment & Plan:   Jeffery George is a 68 y.o. male Routine general medical examination at a health care facility - Plan: Lipid panel, IFOBT POC (occult bld, rslt in office). Anticipatory guidance discussed, prior labs reviewed.   Need for prophylactic vaccination with combined diphtheria-tetanus-pertussis (DTP) vaccine - Plan: Tdap vaccine greater than or equal to 7yo IM given  Need for prophylactic vaccination against Streptococcus pneumoniae (pneumococcus) - Plan: Pneumococcal polysaccharide vaccine 23-valent greater than or equal to 2yo subcutaneous/IM given.  Essential hypertension, benign - Plan: Lipid panel.  Prior CMP noted.  Elevated in office. If remain elevated out of office - return to discuss regimen. Discussed indomethacin may be elevating BP and try to change to  tylenol otc with minimal indomethacin as tolerated. Can discuss other med options if needed for pain.   Type II or unspecified type diabetes mellitus without mention of complication, uncontrolled - improving based on home readings. Continue same regimen, plan on repeat A1C in next month for DM follow up - can follow up with me. Will plan on EKG at future ov as not able to get this done at physical.   Elevated PSA - Plan: PSA - hx of elevated PSA with biopsies that were benign by report, but no recent testing. PSA pending.   Heart murmur - Plan: 2D Echocardiogram without contrast, then depending on results may need cardiology eval.  Heme positive stool - Plan: Ambulatory referral to Gastroenterology for eval and overdue for screening colonoscopy.   Need for shingles vaccine - Plan: zoster  vaccine live, PF, (ZOSTAVAX) 60454 UNT/0.65ML injection rx given to fill at pharmacy.    Meds ordered this encounter  Medications   Omega-3 Fatty Acids (FISH OIL) 1000 MG CAPS    Sig: Take by mouth.   zoster vaccine live, PF, (ZOSTAVAX) 09811 UNT/0.65ML injection    Sig: Inject 19,400 Units into the skin once.    Dispense:  1 each    Refill:  0   Patient Instructions  You should receive a call or letter about your lab results within the next week to 10 days.  Keep a record of your blood pressures outside of the office and bring them to the next office visit in the next month.  Follow up in one month to discuss diabetes and other medications. Take the prescription for the shingles vaccine to your pharmacy to have this delivered. We will refer you for an echocardiogram to look further at the heart murmur, but this can be discussed in follow up as well to determine if you would need to see a Cardiologist. Follow up in one month with myself of Dr. Brigitte Pulse for recheck. Return to the clinic or go to the nearest emergency room if any of your symptoms worsen or new symptoms occur. We can do an EKG at your next visit  as well.  For the ankle pain, try to decease to once per day on your indomethacin as this can cause your blood pressure to elevate.  Tylenol is ok to take as well.  If your blood pressures remain over 140/90 outside the office in then next 2 weeks - return sooner to discuss blood pressure.     Keeping you healthy  Get these tests  Blood pressure- Have your blood pressure checked once a year by your healthcare provider.  Normal blood pressure is 120/80  Weight- Have your body mass index (BMI) calculated to screen for obesity.  BMI is a measure of body fat based on height and weight. You can also calculate your own BMI at [email or web address].  Cholesterol- Have your cholesterol checked every year.  Diabetes- Have your blood sugar checked regularly if you have high blood pressure, high cholesterol, have a family history of diabetes or if you are overweight.  Screening for Colon Cancer- Colonoscopy starting at age 18.  Screening may begin sooner depending on your family history and other health conditions. Follow up colonoscopy as directed by your Gastroenterologist.  Screening for Prostate Cancer- Both blood work (PSA) and a rectal exam help screen for Prostate Cancer.  Screening begins at age 46 with African-American men and at age 76 with Caucasian men.  Screening may begin sooner depending on your family history.  Take these medicines  Aspirin- One aspirin daily can help prevent Heart disease and Stroke.  Flu shot- Every fall.  Tetanus- Every 10 years.  Zostavax- Once after the age of 55 to prevent Shingles.  Pneumonia shot- Once after the age of 36; if you are younger than 53, ask your healthcare provider if you need a Pneumonia shot.  Take these steps  Don't smoke- If you do smoke, talk to your doctor about quitting.  For tips on how to quit, go to www.smokefree.gov or call 1-800-QUIT-NOW.  Be physically active- Exercise 5 days a week for at least 30 minutes.  If you are  not already physically active start slow and gradually work up to 30 minutes of moderate physical activity.  Examples of moderate activity include walking briskly, mowing the yard, dancing, swimming, bicycling,  etc.  Eat a healthy diet- Eat a variety of healthy food such as fruits, vegetables, low fat milk, low fat cheese, yogurt, lean meant, poultry, fish, beans, tofu, etc. For more information go to www.thenutritionsource.org  Drink alcohol in moderation- Limit alcohol intake to less than two drinks a day. Never drink and drive.  Dentist- Brush and floss twice daily; visit your dentist twice a year.  Depression- Your emotional health is as important as your physical health. If you're feeling down, or losing interest in things you would normally enjoy please talk to your healthcare provider.  Eye exam- Visit your eye doctor every year.  Safe sex- If you may be exposed to a sexually transmitted infection, use a condom.  Seat belts- Seat belts can save your life; always wear one.  Smoke/Carbon Monoxide detectors- These detectors need to be installed on the appropriate level of your home.  Replace batteries at least once a year.  Skin cancer- When out in the sun, cover up and use sunscreen 15 SPF or higher.  Violence- If anyone is threatening you, please tell your healthcare provider.  Living Will/ Health care power of attorney- Speak with your healthcare provider and family.

## 2013-05-01 NOTE — Progress Notes (Signed)
   Subjective:    Patient ID: Jeffery George, male    DOB: 1945/11/13, 68 y.o.   MRN: [id number]  HPI    Review of Systems  Constitutional: Negative.   HENT: Negative.   Eyes: Negative.   Respiratory: Negative.   Cardiovascular: Negative.   Gastrointestinal: Negative.   Endocrine: Negative.   Genitourinary: Negative.   Musculoskeletal: Negative.   Skin: Negative.   Allergic/Immunologic: Negative.   Neurological: Negative.   Hematological: Negative.   Psychiatric/Behavioral: Negative.        Objective:   Physical Exam        Assessment & Plan:

## 2013-05-02 LAB — PSA: PSA: 0.86 ng/mL (ref <=4.00)

## 2013-05-09 NOTE — Progress Notes (Signed)
Left message for patient to call back regarding scheduling appointment with Dr Carlota Raspberry for a physical.

## 2013-05-18 ENCOUNTER — Other Ambulatory Visit: Payer: Self-pay

## 2013-05-18 MED ORDER — LISINOPRIL 10 MG PO TABS: 10.0000 mg | ORAL_TABLET | Freq: Every day | ORAL | Status: DC

## 2013-05-18 MED ORDER — METFORMIN HCL 500 MG PO TABS: 500.0000 mg | ORAL_TABLET | Freq: Two times a day (BID) | ORAL | Status: DC

## 2013-05-18 MED ORDER — LANCETS MISC: Status: DC

## 2013-06-05 ENCOUNTER — Ambulatory Visit (INDEPENDENT_AMBULATORY_CARE_PROVIDER_SITE_OTHER): Payer: BC Managed Care – PPO | Admitting: Family Medicine

## 2013-06-05 ENCOUNTER — Encounter: Payer: Self-pay | Admitting: Family Medicine

## 2013-06-05 VITALS — BP 158/82 | HR 75 | Temp 98.6°F | Resp 16 | Ht 71.5 in | Wt 246.0 lb

## 2013-06-05 DIAGNOSIS — IMO0001 Reserved for inherently not codable concepts without codable children: Secondary | ICD-10-CM

## 2013-06-05 DIAGNOSIS — I10 Essential (primary) hypertension: Secondary | ICD-10-CM

## 2013-06-05 DIAGNOSIS — E1165 Type 2 diabetes mellitus with hyperglycemia: Principal | ICD-10-CM

## 2013-06-05 LAB — POCT GLYCOSYLATED HEMOGLOBIN (HGB A1C): Hemoglobin A1C: 5.6

## 2013-06-05 MED ORDER — ONETOUCH ULTRASOFT LANCETS MISC: Status: AC

## 2013-06-05 MED ORDER — GLUCOSE BLOOD VI STRP: ORAL_STRIP | Status: DC

## 2013-06-05 MED ORDER — LISINOPRIL-HYDROCHLOROTHIAZIDE 10-12.5 MG PO TABS: 1.0000 | ORAL_TABLET | Freq: Every day | ORAL | Status: DC

## 2013-06-05 MED ORDER — METFORMIN HCL 500 MG PO TABS: 500.0000 mg | ORAL_TABLET | Freq: Every day | ORAL | Status: DC

## 2013-06-05 MED ORDER — BLOOD GLUCOSE MONITOR KIT: PACK | Status: DC

## 2013-06-05 NOTE — Progress Notes (Signed)
Subjective:    Patient ID: Jeffery George, male    DOB: [birth year], 68 y.o.   MRN: [id number]  HPI Jeffery George is a 68 y.o. male  Here for follow up. See last ov few weeks ago physical.   Essential hypertension, benign -elevated last ov, discussed indomethacin may be elevating BP and try to change to tylenol otc with minimal indomethacin as tolerated. Now on QD dosing only. Seems to be doing ok with pain in ankle - 1 indomethacin in the morning, then takes ibuprofen - 3 to 4 pills l few times per week. None in past few days. Has not tried tylenol. No outside BP's. 172/86 last ov. Missed 4 days prior to last week.   Type II or unspecified type diabetes mellitus without mention of complication, uncontrolled - improving based on home readings last ov. Home readings - 85-90's. Taking metformin 572m BID. No known symptomatic lows. Has had drastic changes in diet.   Lab Results  Component Value Date   HGBA1C 5.6 06/05/2013    Patient Active Problem List   Diagnosis Date Noted  . Gout 02/27/2013  . Essential hypertension, benign 02/27/2013  . Type II or unspecified type diabetes mellitus without mention of complication, uncontrolled 02/27/2013   Past Medical History  Diagnosis Date  . Arthritis   . Diabetes mellitus without complication   . Heart murmur    Past Surgical History  Procedure Laterality Date  . Fracture surgery      Lt ankle  . Hernia repair    . Vasectomy     No Known Allergies Prior to Admission medications   Medication Sig Start Date End Date Taking? Authorizing Provider  aspirin EC 81 MG tablet Take 1 tablet (81 mg total) by mouth daily. 02/27/13  Yes EShawnee Knapp MD  Blood Glucose Monitoring Suppl (BLOOD GLUCOSE MONITOR KIT) KIT Use to test blood sugar twice daily. 02/27/13  Yes EShawnee Knapp MD  colchicine 0.6 MG tablet Take 2 tabs po x 1 then a 3rd tab po 1 hr later. 02/27/13  Yes EShawnee Knapp MD  glucose blood test strip Use to test blood sugar twice daily.  02/27/13  Yes EShawnee Knapp MD  indomethacin (INDOCIN) 50 MG capsule Take 1 capsule (50 mg total) by mouth 3 (three) times daily with meals. 03/31/13  Yes EShawnee Knapp MD  Lancets MISC Use to test blood sugar twice daily 05/18/13  Yes EShawnee Knapp MD  lisinopril (PRINIVIL,ZESTRIL) 10 MG tablet Take 1 tablet (10 mg total) by mouth daily. 05/18/13  Yes EShawnee Knapp MD  metFORMIN (GLUCOPHAGE) 500 MG tablet Take 1 tablet (500 mg total) by mouth 2 (two) times daily with a meal. 05/18/13  Yes EShawnee Knapp MD  Omega-3 Fatty Acids (FISH OIL) 1000 MG CAPS Take by mouth.   Yes Historical Provider, MD  zoster vaccine live, PF, (ZOSTAVAX) 159741UNT/0.65ML injection Inject 19,400 Units into the skin once. 05/01/13   JWendie Agreste MD   History   Social History  . Marital Status: Married    Spouse Name: N/A    Number of Children: N/A  . Years of Education: N/A   Occupational History  . Not on file.   Social History Main Topics  . Smoking status: Former SResearch scientist (life sciences) . Smokeless tobacco: Not on file  . Alcohol Use: No  . Drug Use: No  . Sexual Activity: Not on file   Other Topics Concern  .  Not on file   Social History Narrative   Married   Counsellor at [employer]       Review of Systems  Respiratory: Negative for cough, chest tightness and shortness of breath.   Cardiovascular: Negative for chest pain, palpitations and leg swelling.  Gastrointestinal: Negative for abdominal pain and blood in stool.  Genitourinary: Negative for decreased urine volume.  Neurological: Negative for dizziness, light-headedness and headaches.   As above.     Objective:   Physical Exam  Vitals reviewed. Constitutional: He is oriented to person, place, and time. He appears well-developed and well-nourished.  HENT:  Head: Normocephalic and atraumatic.  Eyes: Pupils are equal, round, and reactive to light.  Cardiovascular: Normal rate, regular rhythm, normal heart sounds and intact distal pulses.     Pulmonary/Chest: Effort normal and breath sounds normal.  Abdominal: Soft. There is no tenderness.  Neurological: He is alert and oriented to person, place, and time.  Microfilament testing of feet normal bilaterally.  Skin: Skin is warm, dry and intact. No rash noted.  Psychiatric: He has a normal mood and affect. His behavior is normal.   Filed Vitals:   06/05/13 1443  BP: 158/82  Pulse: 75  Temp: 98.6 F (37 C)  TempSrc: Oral  Resp: 16  Height: 5' 11.5" (1.816 m)  Weight: 246 lb (111.585 kg)  SpO2: 96%     Results for orders placed in visit on 06/05/13  POCT GLYCOSYLATED HEMOGLOBIN (HGB A1C)      Result Value Ref Range   Hemoglobin A1C 5.6      EKG: SR, no acute findings.     Assessment & Plan:   Jeffery George is a 68 y.o. male Type II or unspecified type diabetes mellitus without mention of complication, uncontrolled - Plan: HM Diabetes Foot Exam, POCT glycosylated hemoglobin (Hb A1C), EKG 12-Lead, glucose blood test strip, Blood Glucose Monitoring Suppl (BLOOD GLUCOSE MONITOR KIT) KIT, Lancets (ONETOUCH ULTRASOFT) lancets, metFORMIN (GLUCOPHAGE) 500 MG tablet  HTN (hypertension) - Plan: lisinopril-hydrochlorothiazide (PRINZIDE,ZESTORETIC) 10-12.5 MG per tablet   DM2- much improved. Commended on great efforts at change in diet and activity. Will decrease metformin to 542m qd. Cont to check home cbg's (new meter rx for cost reasons with his insurance coverage). recheck in 3 months.   HTN - still uncontrolled, and although some efect with NSAIDS discussed, will add HCTZ for improved control. Orthostatic /low BP precautions with starting this.  Changed to combo of lisinopril hct 10/12.535mqd.   Recheck in 6 weeks - can try to discuss ankle symptoms in more detail at that time, but for now, tylenol advised as a trial instead of ibuprofen.   Meds ordered this encounter  Medications  . glucose blood test strip    Sig: Use to test blood sugar twice daily.    Dispense:   200 each    Refill:  3    To check blood sugars bid dx code 250.02 one touch ultra  . Blood Glucose Monitoring Suppl (BLOOD GLUCOSE MONITOR KIT) KIT    Sig: Use to test blood sugar twice daily.    Dispense:  1 each    Refill:  0    One touch ultra to check blood sugars bid diagnosis code 250.02  . Lancets (ONETOUCH ULTRASOFT) lancets    Sig: Use as instructed    Dispense:  100 each    Refill:  12    To check blood sugars bid dx code 250.02 one  touch ultra  . lisinopril-hydrochlorothiazide (PRINZIDE,ZESTORETIC) 10-12.5 MG per tablet    Sig: Take 1 tablet by mouth daily.    Dispense:  90 tablet    Refill:  1  . metFORMIN (GLUCOPHAGE) 500 MG tablet    Sig: Take 1 tablet (500 mg total) by mouth daily with breakfast.    Dispense:  90 tablet    Refill:  0   Patient Instructions  When new medicine arrives - stop lisinopril, start new combination pill. If any new side effects - let me know. Decrease metformin to once per day.  Keep a record of your blood pressures outside of the office and bring them to the next office visit. Try tylenol in place of ibuprofen.  Recheck in next 6 weeks to recheck blood pressure and left ankle pain.   Hypertension As your heart beats, it forces blood through your arteries. This force is your blood pressure. If the pressure is too high, it is called hypertension (HTN) or high blood pressure. HTN is dangerous because you may have it and not know it. High blood pressure may mean that your heart has to work harder to pump blood. Your arteries may be narrow or stiff. The extra work puts you at risk for heart disease, stroke, and other problems.  Blood pressure consists of two numbers, a higher number over a lower, 110/72, for example. It is stated as "110 over 72." The ideal is below 120 for the top number (systolic) and under 80 for the bottom (diastolic). Write down your blood pressure today. You should pay close attention to your blood pressure if you have certain  conditions such as:  Heart failure.  Prior heart attack.  Diabetes  Chronic kidney disease.  Prior stroke.  Multiple risk factors for heart disease. To see if you have HTN, your blood pressure should be measured while you are seated with your arm held at the level of the heart. It should be measured at least twice. A one-time elevated blood pressure reading (especially in the Emergency Department) does not mean that you need treatment. There may be conditions in which the blood pressure is different between your right and left arms. It is important to see your caregiver soon for a recheck. Most people have essential hypertension which means that there is not a specific cause. This type of high blood pressure may be lowered by changing lifestyle factors such as:  Stress.  Smoking.  Lack of exercise.  Excessive weight.  Drug/tobacco/alcohol use.  Eating less salt. Most people do not have symptoms from high blood pressure until it has caused damage to the body. Effective treatment can often prevent, delay or reduce that damage. TREATMENT  When a cause has been identified, treatment for high blood pressure is directed at the cause. There are a large number of medications to treat HTN. These fall into several categories, and your caregiver will help you select the medicines that are best for you. Medications may have side effects. You should review side effects with your caregiver. If your blood pressure stays high after you have made lifestyle changes or started on medicines,   Your medication(s) may need to be changed.  Other problems may need to be addressed.  Be certain you understand your prescriptions, and know how and when to take your medicine.  Be sure to follow up with your caregiver within the time frame advised (usually within two weeks) to have your blood pressure rechecked and to review your medications.  If you are taking more than one medicine to lower your blood  pressure, make sure you know how and at what times they should be taken. Taking two medicines at the same time can result in blood pressure that is too low. SEEK IMMEDIATE MEDICAL CARE IF:  You develop a severe headache, blurred or changing vision, or confusion.  You have unusual weakness or numbness, or a faint feeling.  You have severe chest or abdominal pain, vomiting, or breathing problems. MAKE SURE YOU:   Understand these instructions.  Will watch your condition.  Will get help right away if you are not doing well or get worse. Document Released: 03/30/2005 Document Revised: 06/22/2011 Document Reviewed: 11/18/2007 [facility] Patient Information 2014 [place].

## 2013-06-05 NOTE — Patient Instructions (Addendum)
When new medicine arrives - stop lisinopril, start new combination pill. If any new side effects - let me know. Decrease metformin to once per day.  Keep a record of your blood pressures outside of the office and bring them to the next office visit. Try tylenol in place of ibuprofen.  Recheck in next 6 weeks to recheck blood pressure and left ankle pain.   Hypertension As your heart beats, it forces blood through your arteries. This force is your blood pressure. If the pressure is too high, it is called hypertension (HTN) or high blood pressure. HTN is dangerous because you may have it and not know it. High blood pressure may mean that your heart has to work harder to pump blood. Your arteries may be narrow or stiff. The extra work puts you at risk for heart disease, stroke, and other problems.  Blood pressure consists of two numbers, a higher number over a lower, 110/72, for example. It is stated as "110 over 72." The ideal is below 120 for the top number (systolic) and under 80 for the bottom (diastolic). Write down your blood pressure today. You should pay close attention to your blood pressure if you have certain conditions such as:  Heart failure.  Prior heart attack.  Diabetes  Chronic kidney disease.  Prior stroke.  Multiple risk factors for heart disease. To see if you have HTN, your blood pressure should be measured while you are seated with your arm held at the level of the heart. It should be measured at least twice. A one-time elevated blood pressure reading (especially in the Emergency Department) does not mean that you need treatment. There may be conditions in which the blood pressure is different between your right and left arms. It is important to see your caregiver soon for a recheck. Most people have essential hypertension which means that there is not a specific cause. This type of high blood pressure may be lowered by changing lifestyle factors such  as:  Stress.  Smoking.  Lack of exercise.  Excessive weight.  Drug/tobacco/alcohol use.  Eating less salt. Most people do not have symptoms from high blood pressure until it has caused damage to the body. Effective treatment can often prevent, delay or reduce that damage. TREATMENT  When a cause has been identified, treatment for high blood pressure is directed at the cause. There are a large number of medications to treat HTN. These fall into several categories, and your caregiver will help you select the medicines that are best for you. Medications may have side effects. You should review side effects with your caregiver. If your blood pressure stays high after you have made lifestyle changes or started on medicines,   Your medication(s) may need to be changed.  Other problems may need to be addressed.  Be certain you understand your prescriptions, and know how and when to take your medicine.  Be sure to follow up with your caregiver within the time frame advised (usually within two weeks) to have your blood pressure rechecked and to review your medications.  If you are taking more than one medicine to lower your blood pressure, make sure you know how and at what times they should be taken. Taking two medicines at the same time can result in blood pressure that is too low. SEEK IMMEDIATE MEDICAL CARE IF:  You develop a severe headache, blurred or changing vision, or confusion.  You have unusual weakness or numbness, or a faint feeling.  You have  severe chest or abdominal pain, vomiting, or breathing problems. MAKE SURE YOU:   Understand these instructions.  Will watch your condition.  Will get help right away if you are not doing well or get worse. Document Released: 03/30/2005 Document Revised: 06/22/2011 Document Reviewed: 11/18/2007 [facility] Patient Information 2014 [place].

## 2013-06-16 ENCOUNTER — Encounter: Payer: Self-pay | Admitting: Family Medicine

## 2013-07-17 ENCOUNTER — Encounter: Payer: Self-pay | Admitting: Family Medicine

## 2013-07-17 ENCOUNTER — Ambulatory Visit (INDEPENDENT_AMBULATORY_CARE_PROVIDER_SITE_OTHER): Payer: BC Managed Care – PPO | Admitting: Family Medicine

## 2013-07-17 VITALS — BP 133/75 | HR 70 | Temp 98.7°F | Resp 16 | Ht 72.0 in | Wt 242.0 lb

## 2013-07-17 DIAGNOSIS — G8929 Other chronic pain: Secondary | ICD-10-CM

## 2013-07-17 DIAGNOSIS — E1165 Type 2 diabetes mellitus with hyperglycemia: Secondary | ICD-10-CM

## 2013-07-17 DIAGNOSIS — M25572 Pain in left ankle and joints of left foot: Secondary | ICD-10-CM

## 2013-07-17 DIAGNOSIS — I10 Essential (primary) hypertension: Secondary | ICD-10-CM

## 2013-07-17 DIAGNOSIS — M79609 Pain in unspecified limb: Secondary | ICD-10-CM

## 2013-07-17 DIAGNOSIS — E119 Type 2 diabetes mellitus without complications: Secondary | ICD-10-CM

## 2013-07-17 DIAGNOSIS — IMO0001 Reserved for inherently not codable concepts without codable children: Secondary | ICD-10-CM

## 2013-07-17 MED ORDER — BLOOD GLUCOSE MONITOR KIT: PACK | Status: DC

## 2013-07-17 MED ORDER — GLUCOSE BLOOD VI STRP: ORAL_STRIP | Status: DC

## 2013-07-17 NOTE — Patient Instructions (Addendum)
For your ankle - can start with Tylenol, then only if needed - indomethacin OR advil - but lowest dose possible. At any point you would like to see an orthopaedic doctor, let me know and I will refer you to an ankle specialist.   Diabetes - continue once per day metformin. If blood sugars still controlled next visit in about another 6 -8 weeks, can possibly change to diet only at that time. Schedule eye care appointment.   Blood pressure looks better.  No changes at this point.

## 2013-07-17 NOTE — Progress Notes (Signed)
Subjective:  This chart was scribed for Jeffery Ray, George by Jeffery George, Scribe.  This patient was seen in Room 21 and the patient's care was started at 4:21 PM.   Patient ID: Jeffery George, male    DOB: [birth year], 68 y.o.   MRN: [id number]  HPI  Jeffery George is a 68 y.o. male PCP: Jeffery Cheadle, George  Pt here for f/u HTN and DM.   1. HTN - Still elevated at last visit.  Changed to combo of lisinopril-HCTZ 10-12.32m q.d.  Initially had thought NSAID effect of elevated BP.  Down to 158/82 at last office visit.  He denies side effects.  2. DM - Much improved at last office visit in February, with A1C 5.6.  Decreased metformin to 5065m1x/day.  He states his blood sugar has been typically running from 90-100.  His lowest since his last visit was 8039nd highest was 114.  He denies side effects.   Ophtho - he used to see an ophthalmologist but does not see one regularly Dentist - dental appointment on May 22nd.  3. Chronic ankle pain, left-sided - Chronic left foot arthritis, but thought to have gout component as well.  Treated with indomethacin initially in December of last year.  Per history, multiple fractures after fall from a house 12 years ago.  He denies any changes in his pain since his last visit.  He walks with a limp and occasionally with a cane.  He is limited in how far he can walk because "it gets hot when I walk."  He states that when he walks a quarter mile "it feels like it's on fire."  However he is able to perform ADLs independently and he does not feel he needs a handicap placard.  Last x-George was several years ago.  He has not seen an orthopedist recently.  He had surgery on the ankle 8-9 years ago.  He states his pain has been "pretty much the same" since his surgery.  He has had no gout flare since November of last year.   Patient Active Problem List   Diagnosis Date Noted  . Gout 02/27/2013  . Essential hypertension, benign 02/27/2013  . Type II or unspecified type  diabetes mellitus without mention of complication, uncontrolled 02/27/2013    Past Medical History  Diagnosis Date  . Arthritis   . Diabetes mellitus without complication   . Heart murmur     Past Surgical History  Procedure Laterality Date  . Fracture surgery      Lt ankle  . Hernia repair    . Vasectomy      No Known Allergies   Prior to Admission medications   Medication Sig Start Date End Date Taking? Authorizing Provider  aspirin EC 81 MG tablet Take 1 tablet (81 mg total) by mouth daily. 02/27/13  Yes Jeffery KnappMD  Blood Glucose Monitoring Suppl (BLOOD GLUCOSE MONITOR KIT) KIT Use to test blood sugar twice daily. 06/05/13  Yes Jeffery AgresteMD  glucose blood test strip Use to test blood sugar twice daily. 06/05/13  Yes Jeffery AgresteMD  indomethacin (INDOCIN) 50 MG capsule Take 1 capsule (50 mg total) by mouth 3 (three) times daily with meals. 03/31/13  Yes Jeffery KnappMD  Lancets ([facility]) lancets Use as instructed 06/05/13  Yes Jeffery AgresteMD  lisinopril-hydrochlorothiazide (PRINZIDE,ZESTORETIC) 10-12.5 MG per tablet Take 1 tablet by mouth daily. 06/05/13  Yes Jeffery AgresteMD  metFORMIN (GLUCOPHAGE) 500 MG tablet Take 1 tablet (500 mg total) by mouth daily with breakfast. 06/05/13  Yes Jeffery George  Omega-3 Fatty Acids (FISH OIL) 1000 MG CAPS Take by mouth.   Yes Jeffery George    History   Social History  . Marital Status: Married    Spouse Name: N/A    Number of Children: N/A  . Years of Education: N/A   Occupational History  . Not on file.   Social History Main Topics  . Smoking status: Former Research scientist (life sciences)  . Smokeless tobacco: Not on file  . Alcohol Use: No  . Drug Use: No  . Sexual Activity: Not on file   Other Topics Concern  . Not on file   Social History Narrative   Married   Counsellor at [employer]  Constitutional: Negative for fatigue and unexpected weight change.    Eyes: Negative for visual disturbance.  Respiratory: Negative for cough, chest tightness and shortness of breath.   Cardiovascular: Negative for chest pain, palpitations and leg swelling.  Gastrointestinal: Negative for abdominal pain and blood in stool.  Neurological: Negative for dizziness, light-headedness and headaches.        Objective:   Physical Exam  Vitals reviewed. Constitutional: He is oriented to person, place, and time. He appears well-developed and well-nourished.  HENT:  Head: Normocephalic and atraumatic.  Eyes: EOM are normal. Pupils are equal, round, and reactive to light.  Neck: No JVD present. Carotid bruit is not present.  Cardiovascular: Normal rate, regular rhythm and normal heart sounds.   No murmur heard. Pulmonary/Chest: Effort normal and breath sounds normal. He has no rales.  Musculoskeletal: He exhibits no edema.  Minimal ROM of left ankle.  No focal bony tenderness.  Well-healed scars medial ankle.  NVI distally.  Neurological: He is alert and oriented to person, place, and time.  Skin: Skin is warm and dry.  Psychiatric: He has a normal mood and affect.     Filed Vitals:   07/17/13 1557  BP: 133/75  Pulse: 70  Temp: 98.7 F (37.1 C)  Resp: 16  Height: 6' (1.829 m)  Weight: 242 lb (109.77 kg)  SpO2: 96%   Body mass index is 32.81 kg/(m^2).      Assessment & Plan:   Jeffery George is a 68 y.o. male Diabetes mellitus - Plan: [facility] Diabetes Foot Exam  DM2 (diabetes mellitus, type 2)  HTN (hypertension)  Chronic pain of left ankle  Type II or unspecified type diabetes mellitus without mention of complication, uncontrolled - Plan: Blood Glucose Monitoring Suppl (BLOOD GLUCOSE MONITOR KIT) KIT   Meds ordered this encounter  Medications  . Blood Glucose Monitoring Suppl (BLOOD GLUCOSE MONITOR KIT) KIT    Sig: Use to test blood sugar twice daily.    Dispense:  1 each    Refill:  0    One touch ultra to check blood sugars once daily  diagnosis code 250.02  . glucose blood test strip    Sig: Use as instructed    Dispense:  100 each    Refill:  12    One touch ultra to check blood sugar once daily dx code 250.02   Patient Instructions  For your ankle - can start with Tylenol, then only if needed - indomethacin OR advil - but lowest dose possible. At any point you would like to see an orthopaedic doctor, let me know and I  will refer you to an ankle specialist.   Diabetes - continue once per day metformin. If blood sugars still controlled next visit in about another 6 -8 weeks, can possibly change to diet only at that time. Schedule eye care appointment.   Blood pressure looks better.  No changes at this point.       Jeffery George is a 68 y.o. male HTN (hypertension) - controlled on new regimen. No changes. Ok to refill if needed prior to next ov.   Chronic pain of left ankle - with prior fractures. Tylenol and NSAID only if needed. Discussed ortho eval or PT if he would like, but declined at present as sx's stable.   Type II or unspecified type diabetes mellitus without mention of complication, uncontrolled - Plan: Blood Glucose Monitoring Suppl (BLOOD GLUCOSE MONITOR KIT) KIT - good control.  Continue QD dosing of metformin, and depending on A1C in 6-8 weeks, diet control may be option.    Meds ordered this encounter  Medications  . Blood Glucose Monitoring Suppl (BLOOD GLUCOSE MONITOR KIT) KIT    Sig: Use to test blood sugar twice daily.    Dispense:  1 each    Refill:  0    One touch ultra to check blood sugars once daily diagnosis code 250.02  . glucose blood test strip    Sig: Use as instructed    Dispense:  100 each    Refill:  12    One touch ultra to check blood sugar once daily dx code 250.02   Patient Instructions  For your ankle - can start with Tylenol, then only if needed - indomethacin OR advil - but lowest dose possible. At any point you would like to see an orthopaedic doctor, let me know and  I will refer you to an ankle specialist.   Diabetes - continue once per day metformin. If blood sugars still controlled next visit in about another 6 -8 weeks, can possibly change to diet only at that time. Schedule eye care appointment.   Blood pressure looks better.  No changes at this point.     I personally performed the services described in this documentation, which was scribed in my presence. The recorded information has been reviewed and considered, and addended by me as needed.

## 2013-09-01 ENCOUNTER — Ambulatory Visit: Payer: 59 | Admitting: Family Medicine

## 2013-09-11 ENCOUNTER — Ambulatory Visit (INDEPENDENT_AMBULATORY_CARE_PROVIDER_SITE_OTHER): Payer: BC Managed Care – PPO | Admitting: Family Medicine

## 2013-09-11 ENCOUNTER — Encounter: Payer: Self-pay | Admitting: Family Medicine

## 2013-09-11 VITALS — BP 139/71 | HR 68 | Temp 99.0°F | Resp 16 | Ht 71.5 in | Wt 241.0 lb

## 2013-09-11 DIAGNOSIS — M79609 Pain in unspecified limb: Secondary | ICD-10-CM

## 2013-09-11 DIAGNOSIS — M25579 Pain in unspecified ankle and joints of unspecified foot: Secondary | ICD-10-CM

## 2013-09-11 DIAGNOSIS — E119 Type 2 diabetes mellitus without complications: Secondary | ICD-10-CM

## 2013-09-11 DIAGNOSIS — G8929 Other chronic pain: Secondary | ICD-10-CM

## 2013-09-11 LAB — POCT GLYCOSYLATED HEMOGLOBIN (HGB A1C): Hemoglobin A1C: 5.8

## 2013-09-11 LAB — GLUCOSE, POCT (MANUAL RESULT ENTRY): POC Glucose: 101 mg/dl — AB (ref 70–99)

## 2013-09-11 MED ORDER — INDOMETHACIN 50 MG PO CAPS: 50.0000 mg | ORAL_CAPSULE | Freq: Three times a day (TID) | ORAL | Status: DC

## 2013-09-11 MED ORDER — INDOMETHACIN 50 MG PO CAPS: 50.0000 mg | ORAL_CAPSULE | Freq: Three times a day (TID) | ORAL | Status: DC | PRN

## 2013-09-11 NOTE — Patient Instructions (Addendum)
Tylenol is safest for ankle pain, but if needed indomethacin with lowest effective dose.   Can continue metformin at current dose.  Diet control is an option, but your control may not be as good. recheck levels in 6 months (3 months if you come off medicine and try diet control).   Return to the clinic or go to the nearest emergency room if any of your symptoms worsen or new symptoms occur.

## 2013-09-11 NOTE — Progress Notes (Signed)
Subjective:    Patient ID: Jeffery George, male    DOB: [birth year], 68 y.o.   MRN: [id number]  HPI MANAS Jeffery George is a 68 y.o. male   DM2- Improved last ov - see notes, on metformin 520m qd.  Home readings 90-105, lowest in upper 80's, highest 110.  Low of 76 when working outside after not eating, but no other symptomatic lows. Has been maintaining diet, and physical exercise in yard and with work.  Lab Results  Component Value Date   HGBA1C 5.6 06/05/2013   Chronic pain of left ankle - with prior fractures. Tylenol and NSAID only if needed. Discussed ortho eval or PT if he would like at last ov - but declined at that time. Has taken indomethacin 50 mg once per day for the ankle, as well as tylenol. Same pain as in past. Hx of gout, but ankle pain constant. Swelling at times, but no recent change, no calf pain. Pain on inside where scar is located.    Patient Active Problem List   Diagnosis Date Noted  . Gout 02/27/2013  . Essential hypertension, benign 02/27/2013  . Type II or unspecified type diabetes mellitus without mention of complication, uncontrolled 02/27/2013   Past Medical History  Diagnosis Date  . Arthritis   . Diabetes mellitus without complication   . Heart murmur    Past Surgical History  Procedure Laterality Date  . Fracture surgery      Lt ankle  . Hernia repair    . Vasectomy     No Known Allergies Prior to Admission medications   Medication Sig Start Date End Date Taking? Authorizing Provider  aspirin EC 81 MG tablet Take 1 tablet (81 mg total) by mouth daily. 02/27/13  Yes EShawnee Knapp MD  Blood Glucose Monitoring Suppl (BLOOD GLUCOSE MONITOR KIT) KIT Use to test blood sugar twice daily. 07/17/13  Yes JWendie Agreste MD  glucose blood test strip Use to test blood sugar twice daily. 06/05/13  Yes JWendie Agreste MD  indomethacin (INDOCIN) 50 MG capsule Take 1 capsule (50 mg total) by mouth 3 (three) times daily with meals. 09/11/13  Yes JWendie Agreste MD   Lancets ([facility]) lancets Use as instructed 06/05/13  Yes JWendie Agreste MD  lisinopril-hydrochlorothiazide (PRINZIDE,ZESTORETIC) 10-12.5 MG per tablet Take 1 tablet by mouth daily. 06/05/13  Yes JWendie Agreste MD  metFORMIN (GLUCOPHAGE) 500 MG tablet Take 1 tablet (500 mg total) by mouth daily with breakfast. 06/05/13  Yes JWendie Agreste MD  Omega-3 Fatty Acids (FISH OIL) 1000 MG CAPS Take by mouth.   Yes Historical Provider, MD   History   Social History  . Marital Status: Married    Spouse Name: N/A    Number of Children: N/A  . Years of Education: N/A   Occupational History  . Not on file.   Social History Main Topics  . Smoking status: Former SResearch scientist (life sciences) . Smokeless tobacco: Not on file  . Alcohol Use: No  . Drug Use: No  . Sexual Activity: Not on file   Other Topics Concern  . Not on file   Social History Narrative   Married   [place] Constitutional: Negative for appetite change, fatigue and unexpected weight change.  Eyes: Negative for visual disturbance.  Respiratory: Negative for cough, chest tightness and shortness of breath.   Cardiovascular: Negative for  chest pain and palpitations.  Gastrointestinal: Negative for abdominal pain and blood in stool.  Neurological: Negative for dizziness, light-headedness and headaches.       Objective:   Physical Exam  Vitals reviewed. Constitutional: He is oriented to person, place, and time. He appears well-developed and well-nourished.  HENT:  Head: Normocephalic and atraumatic.  Right Ear: External ear normal.  Left Ear: External ear normal.  Mouth/Throat: Oropharynx is clear and moist.  Eyes: Conjunctivae and EOM are normal. Pupils are equal, round, and reactive to light.  Neck: Normal range of motion. Neck supple. No thyromegaly present.  Cardiovascular: Normal rate, regular rhythm, normal heart sounds and intact distal pulses.     Pulmonary/Chest: Effort normal and breath sounds normal. No respiratory distress. He has no wheezes.  Abdominal: Soft. He exhibits no distension. There is no tenderness.  Musculoskeletal: Normal range of motion. He exhibits no edema.       Left ankle: Tenderness. Medial malleolus (scar intact, trace ankle edema. ) tenderness found. Achilles tendon normal. Achilles tendon exhibits no pain and no defect.  Lymphadenopathy:    He has no cervical adenopathy.  Neurological: He is alert and oriented to person, place, and time. He has normal reflexes.  Skin: Skin is warm and dry.  Psychiatric: He has a normal mood and affect. His behavior is normal.     Results for orders placed in visit on 09/11/13  GLUCOSE, POCT (MANUAL RESULT ENTRY)      Result Value Ref Range   POC Glucose 101 (*) 70 - 99 mg/dl  POCT GLYCOSYLATED HEMOGLOBIN (HGB A1C)      Result Value Ref Range   Hemoglobin A1C 5.8          Assessment & Plan:   [name] is a 68 y.o. male Diabetes mellitus - Plan: HM Diabetes Foot Exam, POCT glucose (manual entry), POCT glycosylated hemoglobin (Hb A1C)  - controlled, but at this A1c, could continue metformin once per day, or since he has done so well to now with diet, can try diet control and recheck A1c in 3 months. (if remains on metformin, 6 month recheck ok).   Chronic ankle pain - Plan: indomethacin (INDOCIN) 50 MG capsule  -underlying gout, but daily pain likely OA from prior surgeries. Tylenol, and only indomethacin if needed for breakthrough pain.  Again discussed ortho eval, but deffered at this point.   Meds ordered this encounter  Medications  . DISCONTD: indomethacin (INDOCIN) 50 MG capsule    Sig: Take 1 capsule (50 mg total) by mouth 3 (three) times daily with meals.    Dispense:  180 capsule    Refill:  1  . indomethacin (INDOCIN) 50 MG capsule    Sig: Take 1 capsule (50 mg total) by mouth 3 (three) times daily as needed for moderate pain.    Dispense:  180  capsule    Refill:  0   Patient Instructions  Tylenol is safest for ankle pain, but if needed indomethacin with lowest effective dose.   Can continue metformin at current dose.  Diet control is an option, but your control may not be as good. recheck levels in 6 months (3 months if you come off medicine and try diet control).   Return to the clinic or go to the nearest emergency room if any of your symptoms worsen or new symptoms occur.

## 2013-10-17 ENCOUNTER — Other Ambulatory Visit: Payer: Self-pay

## 2013-10-17 DIAGNOSIS — E1165 Type 2 diabetes mellitus with hyperglycemia: Principal | ICD-10-CM

## 2013-10-17 DIAGNOSIS — IMO0001 Reserved for inherently not codable concepts without codable children: Secondary | ICD-10-CM

## 2013-10-17 MED ORDER — GLUCOSE BLOOD VI STRP: ORAL_STRIP | Status: DC

## 2013-10-19 ENCOUNTER — Telehealth: Payer: Self-pay | Admitting: Family Medicine

## 2013-10-27 ENCOUNTER — Other Ambulatory Visit: Payer: Self-pay | Admitting: Family Medicine

## 2014-01-10 ENCOUNTER — Other Ambulatory Visit: Payer: Self-pay | Admitting: Family Medicine

## 2014-03-20 ENCOUNTER — Other Ambulatory Visit: Payer: Self-pay | Admitting: Family Medicine

## 2014-03-25 ENCOUNTER — Other Ambulatory Visit: Payer: Self-pay | Admitting: Family Medicine

## 2014-04-08 ENCOUNTER — Other Ambulatory Visit: Payer: Self-pay | Admitting: Family Medicine

## 2014-04-30 ENCOUNTER — Other Ambulatory Visit: Payer: Self-pay | Admitting: Family Medicine

## 2014-06-11 ENCOUNTER — Ambulatory Visit (INDEPENDENT_AMBULATORY_CARE_PROVIDER_SITE_OTHER): Payer: BLUE CROSS/BLUE SHIELD | Admitting: Family Medicine

## 2014-06-11 ENCOUNTER — Encounter: Payer: Self-pay | Admitting: Family Medicine

## 2014-06-11 VITALS — BP 165/73 | HR 83 | Temp 98.3°F | Resp 16 | Ht 72.25 in | Wt 256.6 lb

## 2014-06-11 DIAGNOSIS — E785 Hyperlipidemia, unspecified: Secondary | ICD-10-CM

## 2014-06-11 DIAGNOSIS — R059 Cough, unspecified: Secondary | ICD-10-CM

## 2014-06-11 DIAGNOSIS — R011 Cardiac murmur, unspecified: Secondary | ICD-10-CM

## 2014-06-11 DIAGNOSIS — Z23 Encounter for immunization: Secondary | ICD-10-CM

## 2014-06-11 DIAGNOSIS — E119 Type 2 diabetes mellitus without complications: Secondary | ICD-10-CM

## 2014-06-11 DIAGNOSIS — E786 Lipoprotein deficiency: Secondary | ICD-10-CM

## 2014-06-11 DIAGNOSIS — R05 Cough: Secondary | ICD-10-CM

## 2014-06-11 DIAGNOSIS — I10 Essential (primary) hypertension: Secondary | ICD-10-CM

## 2014-06-11 LAB — GLUCOSE, POCT (MANUAL RESULT ENTRY): POC Glucose: 97 mg/dl (ref 70–99)

## 2014-06-11 LAB — POCT GLYCOSYLATED HEMOGLOBIN (HGB A1C): HEMOGLOBIN A1C: 5.9

## 2014-06-11 MED ORDER — LOSARTAN POTASSIUM-HCTZ 100-12.5 MG PO TABS: 1.0000 | ORAL_TABLET | Freq: Every day | ORAL | Status: DC

## 2014-06-11 MED ORDER — ATORVASTATIN CALCIUM 10 MG PO TABS: 10.0000 mg | ORAL_TABLET | Freq: Every day | ORAL | Status: DC

## 2014-06-11 MED ORDER — ACCU-CHEK SOFTCLIX LANCETS MISC: Status: DC

## 2014-06-11 MED ORDER — METFORMIN HCL 500 MG PO TABS: 500.0000 mg | ORAL_TABLET | Freq: Every day | ORAL | Status: DC

## 2014-06-11 NOTE — Patient Instructions (Addendum)
Stop lisinopril/HCT. (the lisinopril may be causing the cough). Start losartan/hct. If the cough goes away with this change - let me know and we will note your chart that the lisinopril caused a cough, so we can avoid this class of medicines in the future. Because your blood pressure is still running too high, the new blood pressure med is a little stronger. Watch for low blood pressure symptoms such as lightheadedness or dizziness. If those symptoms occur - let me know.  Keep a record of your blood pressures outside of the office and bring them to the next office visit.  I can still hear the heart murmur. There is still an order pending for the "echocardiogram" of your heart when you are able to have this done.   Start Lipitor for cholesterol based on 10 year heart disease risk. If new muscle aches or other new side effects - let me know. You will need to return in 6-8 weeks for blood tet only to make sure this medicine is not affecting your liver.   Return to the clinic or go to the nearest emergency room if any of your symptoms worsen or new symptoms occur.

## 2014-06-11 NOTE — Progress Notes (Addendum)
Subjective:  This chart was scribed for Jeffery Agreste, MD by Tamsen Roers, at [facility].  This patient was seen in room 24 and the patient's care was started at [facility] PM.    Patient ID: Jeffery George, male    DOB: 06-12-1945, 69 y.o.   MRN: [id number]  HPI HPI Comments: Jeffery George is a 69 y.o. male who presents to [facility] for here for a follow-up for his diabetes and hypertension. ----- Patient notes that he needs his medications refilled, he has 5 Metformins left. He has not yet had an EKG   Diabetes: Last visit was in June 2015.  A1C of 5.8.  He has had improved his diet and physical exercise to achieve some of this control.  Recommended trial of continued diet control only and recheck A1C in 3 months or if he remained on metformin to follow up in 6 months.  Normal urine micro albumin in December 2014.  Lipids overall normal January 2015.---- He quit taking his metformin for three weeks but states his sugar went from 125 to 130 (highest 131) and was controlled as soon as he took his Metformin again.  Notes that his sugar has been in the low 80's a time or two and states that he ate and felt better right away. Denies any abdominal symptoms, diarrhea or bloating.     Dentist: Patient had his last dentist visit in December 2015.     Hypertension:  Takes Lisinopril HCT 10-12.5. Last kidney function in December 2014 was normal.----  Patient has not been checking his blood pressure at home.  Patient has been compliant with taking his Lisinopril but notes that he has had a tickling dry cough for a long time. He states that they are most frequent in the mornings and resolved by drinking a glass of water.   Lab Results  Component Value Date   CREATININE 0.81 03/31/2013    Aspirin: ---- Patient is taking baby aspirin and fish oil once a day.    Eye doctor: ----He has seen his eye doctor in December and states that there were no problems.      Health Maintenance: Patient received his flu shot today.     Patient Active Problem List   Diagnosis Date Noted  . Gout 02/27/2013  . Essential hypertension, benign 02/27/2013  . Type II or unspecified type diabetes mellitus without mention of complication, uncontrolled 02/27/2013   Past Medical History  Diagnosis Date  . Arthritis   . Diabetes mellitus without complication   . Heart murmur    Past Surgical History  Procedure Laterality Date  . Fracture surgery      Lt ankle  . Hernia repair    . Vasectomy     No Known Allergies Prior to Admission medications   Medication Sig Start Date End Date Taking? Authorizing Provider  ACCU-CHEK SOFTCLIX LANCETS lancets Test blood sugar daily. Dx code E11.9 04/10/14   Shawnee Knapp, MD  aspirin EC 81 MG tablet Take 1 tablet (81 mg total) by mouth daily. 02/27/13   Shawnee Knapp, MD  Blood Glucose Monitoring Suppl (BLOOD GLUCOSE MONITOR KIT) KIT Use to test blood sugar twice daily. 07/17/13   Jeffery Agreste, MD  glucose blood test strip Use to test blood sugar daily. Dx code: 250.00 10/17/13   Jeffery Agreste, MD  indomethacin (INDOCIN) 50 MG capsule Take 1 capsule (50 mg total) by mouth 3 (  three) times daily as needed for moderate pain. 09/11/13   Jeffery Agreste, MD  Lancets [facility] ULTRASOFT) lancets Use as instructed 06/05/13   Jeffery Agreste, MD  lisinopril-hydrochlorothiazide (PRINZIDE,ZESTORETIC) 10-12.5 MG per tablet Take 1 tablet by mouth daily. PATIENT NEEDS OFFICE VISIT FOR ADDITIONAL REFILLS 03/26/14   Chelle S Jeffery, PA-C  metFORMIN (GLUCOPHAGE) 500 MG tablet Take 1 tablet (500 mg total) by mouth daily with breakfast. PATIENT NEEDS OFFICE VISIT FOR ADDITIONAL REFILLS 03/21/14   Jeffery Agreste, MD  Omega-3 Fatty Acids (FISH OIL) 1000 MG CAPS Take by mouth.    Historical Provider, MD   History   Social History  . Marital Status: Married    Spouse Name: N/A  . Number of Children: N/A  . Years of Education: N/A    Occupational History  . Not on file.   Social History Main Topics  . Smoking status: Former Research scientist (life sciences)  . Smokeless tobacco: Not on file  . Alcohol Use: No  . Drug Use: No  . Sexual Activity: Not on file   Other Topics Concern  . Not on file   Social History Narrative   Married   Counsellor at [employer]  Constitutional: Negative for fatigue and unexpected weight change.  Eyes: Negative for visual disturbance.  Respiratory: Positive for cough. Negative for chest tightness and shortness of breath.   Cardiovascular: Negative for chest pain, palpitations and leg swelling.  Gastrointestinal: Negative for abdominal pain and blood in stool.  Neurological: Negative for dizziness, light-headedness and headaches.       Objective:   Physical Exam  Constitutional: He is oriented to person, place, and time. He appears well-developed and well-nourished.  HENT:  Head: Normocephalic and atraumatic.  Eyes: EOM are normal. Pupils are equal, round, and reactive to light.  Neck: No JVD present. Carotid bruit is not present.  Cardiovascular: Regular rhythm.   No murmur heard. 2/6 systolic murmur at LUSB  No radiation to the carotids Otherwise normal rhythm.   Pulmonary/Chest: Effort normal and breath sounds normal. He has no rales.  Musculoskeletal: He exhibits no edema.  Neurological: He is alert and oriented to person, place, and time.  Skin: Skin is warm and dry.  Psychiatric: He has a normal mood and affect.  Vitals reviewed.    Filed Vitals:   06/11/14 1522  BP: 165/73  Pulse: 83  Temp: 98.3 F (36.8 C)  TempSrc: Oral  Resp: 16  Height: 6' 0.25" (1.835 m)  Weight: 256 lb 9.6 oz (116.393 kg)  SpO2: 96%   Results for orders placed or performed in visit on 06/11/14  POCT glycosylated hemoglobin (Hb A1C)  Result Value Ref Range   Hemoglobin A1C 5.9   POCT glucose (manual entry)  Result Value Ref Range   POC Glucose 97 70 - 99 mg/dl         Assessment & Plan:   [name] is a 69 y.o. male Need for prophylactic vaccination and inoculation against influenza - Plan: Flu Vaccine QUAD 36+ mos IM  - flu vaccine given.   Diabetes type 2, controlled - Plan: metFORMIN (GLUCOPHAGE) 500 MG tablet, ACCU-CHEK SOFTCLIX LANCETS lancets, HM Diabetes Foot Exam, POCT glycosylated hemoglobin (Hb A1C), POCT glucose (manual entry)  -well controlled. Option of diet only therapy discussed, but he is tolerating metformin and would like to stay on this for now.   Low HDL (under 40), Hyperlipidemia - Plan: atorvastatin (LIPITOR)  10 MG tablet, COMPLETE METABOLIC PANEL WITH GFR, Lipid panel, COMPLETE METABOLIC PANEL WITH GFR  -10 year ASCVD risk calculator on visit with prior lipid panel and systolic BP 330 from prior OV. 10 year risk 30.4%. Will start Lipitor, with ultimate goal of high intensity therapy (35m). SED, rtc precautions, and CMP in 6 weeks - lab only order placed.    Essential hypertension, Cough- Plan: losartan-hydrochlorothiazide (HYZAAR) 100-12.5 MG per tablet  -decreased control and possible ACE-I cough.  -change to Losartan/HCT 100/12.554mqd.  Orthostatic precautions. If cough resolves - let me know to place ACE-I intolerance on allergy list.   Heart Murmur - echo order in system.  Recommended to have this done. Asx.     Meds ordered this encounter  Medications  . metFORMIN (GLUCOPHAGE) 500 MG tablet    Sig: Take 1 tablet (500 mg total) by mouth daily with breakfast.    Dispense:  90 tablet    Refill:  1  . ACCU-CHEK SOFTCLIX LANCETS lancets    Sig: Test blood sugar daily. Dx code E11.9    Dispense:  100 each    Refill:  1  . atorvastatin (LIPITOR) 10 MG tablet    Sig: Take 1 tablet (10 mg total) by mouth daily.    Dispense:  90 tablet    Refill:  1  . losartan-hydrochlorothiazide (HYZAAR) 100-12.5 MG per tablet    Sig: Take 1 tablet by mouth daily.    Dispense:  90 tablet    Refill:  1   Patient  Instructions  Stop lisinopril/HCT. (the lisinopril may be causing the cough). Start losartan/hct. If the cough goes away with this change - let me know and we will note your chart that the lisinopril caused a cough, so we can avoid this class of medicines in the future. Because your blood pressure is still running too high, the new blood pressure med is a little stronger. Watch for low blood pressure symptoms such as lightheadedness or dizziness. If those symptoms occur - let me know.  Keep a record of your blood pressures outside of the office and bring them to the next office visit.  I can still hear the heart murmur. There is still an order pending for the "echocardiogram" of your heart when you are able to have this done.   Start Lipitor for cholesterol based on 10 year heart disease risk. If new muscle aches or other new side effects - let me know. You will need to return in 6-8 weeks for blood tet only to make sure this medicine is not affecting your liver.   Return to the clinic or go to the nearest emergency room if any of your symptoms worsen or new symptoms occur.

## 2014-06-12 LAB — COMPLETE METABOLIC PANEL WITH GFR
ALT: 26 U/L (ref 0–53)
AST: 19 U/L (ref 0–37)
Albumin: 4.1 g/dL (ref 3.5–5.2)
Alkaline Phosphatase: 37 U/L — ABNORMAL LOW (ref 39–117)
BUN: 26 mg/dL — AB (ref 6–23)
CHLORIDE: 102 meq/L (ref 96–112)
CO2: 23 mEq/L (ref 19–32)
CREATININE: 0.99 mg/dL (ref 0.50–1.35)
Calcium: 8.9 mg/dL (ref 8.4–10.5)
GFR, EST NON AFRICAN AMERICAN: 78 mL/min
GFR, Est African American: >89 mL/min
Glucose, Bld: 109 mg/dL — ABNORMAL HIGH (ref 70–99)
Potassium: 4 mEq/L (ref 3.5–5.3)
Sodium: 138 mEq/L (ref 135–145)
TOTAL PROTEIN: 6.9 g/dL (ref 6.0–8.3)
Total Bilirubin: 0.5 mg/dL (ref 0.2–1.2)

## 2014-06-12 LAB — LIPID PANEL
CHOLESTEROL: 135 mg/dL (ref 0–200)
HDL: 43 mg/dL (ref >=40)
LDL CALC: 68 mg/dL (ref 0–99)
Total CHOL/HDL Ratio: 3.1 Ratio
Triglycerides: 121 mg/dL (ref <150)
VLDL: 24 mg/dL (ref 0–40)

## 2014-06-15 ENCOUNTER — Encounter: Payer: Self-pay | Admitting: Radiology

## 2014-08-03 ENCOUNTER — Other Ambulatory Visit (INDEPENDENT_AMBULATORY_CARE_PROVIDER_SITE_OTHER): Payer: BLUE CROSS/BLUE SHIELD | Admitting: Family Medicine

## 2014-08-03 DIAGNOSIS — E785 Hyperlipidemia, unspecified: Secondary | ICD-10-CM

## 2014-08-03 LAB — COMPLETE METABOLIC PANEL WITH GFR
ALT: 25 U/L (ref 0–53)
AST: 19 U/L (ref 0–37)
Albumin: 4 g/dL (ref 3.5–5.2)
Alkaline Phosphatase: 38 U/L — ABNORMAL LOW (ref 39–117)
BUN: 24 mg/dL — ABNORMAL HIGH (ref 6–23)
CO2: 27 meq/L (ref 19–32)
CREATININE: 0.9 mg/dL (ref 0.50–1.35)
Calcium: 8.9 mg/dL (ref 8.4–10.5)
Chloride: 104 mEq/L (ref 96–112)
GFR, Est Non African American: 88 mL/min
Glucose, Bld: 109 mg/dL — ABNORMAL HIGH (ref 70–99)
Potassium: 4.2 mEq/L (ref 3.5–5.3)
Sodium: 141 mEq/L (ref 135–145)
TOTAL PROTEIN: 6.8 g/dL (ref 6.0–8.3)
Total Bilirubin: 0.7 mg/dL (ref 0.2–1.2)

## 2014-08-22 ENCOUNTER — Encounter: Payer: Self-pay | Admitting: Family Medicine

## 2014-09-04 ENCOUNTER — Other Ambulatory Visit: Payer: Self-pay | Admitting: Family Medicine

## 2014-11-19 ENCOUNTER — Other Ambulatory Visit: Payer: Self-pay | Admitting: Family Medicine

## 2014-11-29 ENCOUNTER — Other Ambulatory Visit: Payer: Self-pay | Admitting: Family Medicine

## 2014-12-10 ENCOUNTER — Encounter: Payer: Self-pay | Admitting: Family Medicine

## 2014-12-10 ENCOUNTER — Ambulatory Visit (INDEPENDENT_AMBULATORY_CARE_PROVIDER_SITE_OTHER): Payer: BLUE CROSS/BLUE SHIELD | Admitting: Family Medicine

## 2014-12-10 VITALS — BP 150/77 | HR 72 | Temp 98.7°F | Resp 16 | Wt 259.0 lb

## 2014-12-10 DIAGNOSIS — M25572 Pain in left ankle and joints of left foot: Secondary | ICD-10-CM | POA: Diagnosis not present

## 2014-12-10 DIAGNOSIS — I10 Essential (primary) hypertension: Secondary | ICD-10-CM

## 2014-12-10 DIAGNOSIS — G8929 Other chronic pain: Secondary | ICD-10-CM

## 2014-12-10 DIAGNOSIS — Z23 Encounter for immunization: Secondary | ICD-10-CM

## 2014-12-10 DIAGNOSIS — E119 Type 2 diabetes mellitus without complications: Secondary | ICD-10-CM

## 2014-12-10 DIAGNOSIS — E785 Hyperlipidemia, unspecified: Secondary | ICD-10-CM | POA: Diagnosis not present

## 2014-12-10 LAB — GLUCOSE, POCT (MANUAL RESULT ENTRY): POC Glucose: 91 mg/dl (ref 70–99)

## 2014-12-10 LAB — POCT GLYCOSYLATED HEMOGLOBIN (HGB A1C): HEMOGLOBIN A1C: 6.1

## 2014-12-10 LAB — COMPREHENSIVE METABOLIC PANEL
ALT: 33 U/L (ref 9–46)
AST: 22 U/L (ref 10–35)
Albumin: 4.2 g/dL (ref 3.6–5.1)
Alkaline Phosphatase: 41 U/L (ref 40–115)
BUN: 25 mg/dL (ref 7–25)
CHLORIDE: 105 mmol/L (ref 98–110)
CO2: 26 mmol/L (ref 20–31)
Calcium: 9.2 mg/dL (ref 8.6–10.3)
Creat: 0.91 mg/dL (ref 0.70–1.25)
GLUCOSE: 112 mg/dL — AB (ref 65–99)
POTASSIUM: 4 mmol/L (ref 3.5–5.3)
SODIUM: 141 mmol/L (ref 135–146)
Total Bilirubin: 0.5 mg/dL (ref 0.2–1.2)
Total Protein: 6.9 g/dL (ref 6.1–8.1)

## 2014-12-10 LAB — LIPID PANEL
CHOL/HDL RATIO: 2.5 ratio (ref <=5.0)
Cholesterol: 103 mg/dL — ABNORMAL LOW (ref 125–200)
HDL: 42 mg/dL (ref >=40)
LDL CALC: 48 mg/dL (ref <130)
Triglycerides: 66 mg/dL (ref <150)
VLDL: 13 mg/dL (ref <30)

## 2014-12-10 MED ORDER — ATORVASTATIN CALCIUM 10 MG PO TABS: 10.0000 mg | ORAL_TABLET | Freq: Every day | ORAL | Status: DC

## 2014-12-10 MED ORDER — LOSARTAN POTASSIUM-HCTZ 100-12.5 MG PO TABS: 1.0000 | ORAL_TABLET | Freq: Every day | ORAL | Status: DC

## 2014-12-10 MED ORDER — DICLOFENAC SODIUM 1 % TD GEL: 4.0000 g | Freq: Four times a day (QID) | TRANSDERMAL | Status: DC

## 2014-12-10 MED ORDER — METFORMIN HCL 500 MG PO TABS: 500.0000 mg | ORAL_TABLET | Freq: Every day | ORAL | Status: DC

## 2014-12-10 MED ORDER — INDOMETHACIN 50 MG PO CAPS
50.0000 mg | ORAL_CAPSULE | Freq: Three times a day (TID) | ORAL | Status: DC | PRN
Start: 2014-12-10 — End: 2015-07-09

## 2014-12-10 NOTE — Progress Notes (Signed)
Subjective:  This chart was scribed for Jeffery Ray, MD by Moises Blood, Medical Scribe. This patient was seen in room 27 and the patient's care was started 4:32 PM.    Patient ID: Jeffery George, male    DOB: 1945-07-14, 69 y.o.   MRN: [id number]  HPI [name] is a 69 y.o. male Here for diabetic follow up. His last visit was in February.   DM Lab Results  Component Value Date   HGBA1C 5.9 06/11/2014  We discussed diet but he wants to continue on Metformin 500 mg QD.  He denies any complications with the metformin. His blood was drawn this morning.  He measures his blood sugar at the range 93-108.   Vision He last saw in December 2015 with no changes in eyes.   Dentist He last saw dentist 5 days ago to have a tooth extracted due to a previous problem.   Exercise He does yardwork at home. His work requires physical activity.   HLD He is taking lipitor 10 mg QD.  Lab Results  Component Value Date   CHOL 135 06/11/2014   HDL 43 06/11/2014   LDLCALC 68 06/11/2014   TRIG 121 06/11/2014   CHOLHDL 3.1 06/11/2014  He denies any complications with the lipitor. He denies chest pain, SOB, or SOB with exercise.   HTN Lab Results  Component Value Date   CREATININE 0.90 08/03/2014  He takes hyzaar 12.5 mg QD with no complications. He hasn't checked BP at home. It was checked at the dentist office and it was fine.   Left ankle He had an ankle fusion in the past and has pain because of it. He took indomethacin QD for the chronic ankle pain. He's tried tylenol with minimal relief. Sometimes even after the indomethacin, he would still feel the pain but he denies taking a second dose. He would usually brace through the day. He's never taken narcotic type medication for this issue.   Immunizations He received his flu shot today.    Patient Active Problem List   Diagnosis Date Noted  . Gout 02/27/2013  . Essential hypertension, benign 02/27/2013  . Type II or  unspecified type diabetes mellitus without mention of complication, uncontrolled 02/27/2013   Past Medical History  Diagnosis Date  . Arthritis   . Diabetes mellitus without complication   . Heart murmur    Past Surgical History  Procedure Laterality Date  . Fracture surgery      Lt ankle  . Hernia repair    . Vasectomy     Not on File Prior to Admission medications   Medication Sig Start Date End Date Taking? Authorizing Provider  ACCU-CHEK SOFTCLIX LANCETS lancets TEST BLOOD SUGAR DAILY 11/29/14   [name], MD  aspirin EC 81 MG tablet Take 1 tablet (81 mg total) by mouth daily. 02/27/13   Shawnee Knapp, MD  atorvastatin (LIPITOR) 10 MG tablet TAKE 1 TABLET DAILY 11/20/14   [name], MD  Blood Glucose Monitoring Suppl (BLOOD GLUCOSE MONITOR KIT) KIT Use to test blood sugar twice daily. 07/17/13   [name], MD  indomethacin (INDOCIN) 50 MG capsule Take 1 capsule (50 mg total) by mouth 3 (three) times daily as needed for moderate pain. 09/11/13   [name], MD  Lancets [facility] ULTRASOFT) lancets Use as instructed 06/05/13   [name], MD  losartan-hydrochlorothiazide [facility]) 100-12.5 MG per tablet TAKE 1 TABLET DAILY 11/20/14   Ranell Patrick  Carlota Raspberry, MD  metFORMIN (GLUCOPHAGE) 500 MG tablet TAKE 1 TABLET DAILY WITH BREAKFAST 11/20/14   [name], MD  Omega-3 Fatty Acids (FISH OIL) 1000 MG CAPS Take by mouth.    Historical Provider, MD  ONE TOUCH ULTRA TEST test strip TEST BLOOD SUGAR DAILY 09/04/14   [name], MD   Social History   Social History  . Marital Status: Married    Spouse Name: N/A  . Number of Children: N/A  . Years of Education: N/A   Occupational History  . Not on file.   Social History Main Topics  . Smoking status: Former Research scientist (life sciences)  . Smokeless tobacco: Not on file  . Alcohol Use: No  . Drug Use: No  . Sexual Activity: Not on file   Other Topics Concern  . 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  Eyes: Negative for visual disturbance.  Respiratory: Negative for cough, chest tightness and shortness of breath.   Cardiovascular: Negative for chest pain, palpitations and leg swelling.  Gastrointestinal: Negative for abdominal pain and blood in stool.  Musculoskeletal: Positive for arthralgias (left ankle).  Neurological: Negative for dizziness, light-headedness and headaches.       Objective:   Physical Exam  Constitutional: He is oriented to person, place, and time. He appears well-developed and well-nourished.  HENT:  Head: Normocephalic and atraumatic.  Eyes: EOM are normal. Pupils are equal, round, and reactive to light.  Neck: No JVD present. Carotid bruit is not present.  Cardiovascular: Normal rate, regular rhythm and normal heart sounds.   No murmur heard. Pulmonary/Chest: Effort normal and breath sounds normal. He has no rales.  Musculoskeletal: He exhibits no edema.  well-healed scar with bony prominences on left ankle  NVI distally with some stasis changes and trace nonpitting edema in lower extremities  Neurological: He is alert and oriented to person, place, and time.  Skin: Skin is warm and dry.  Psychiatric: He has a normal mood and affect.  Vitals reviewed.   Filed Vitals:   12/10/14 1604  BP: 150/77  Pulse: 72  Temp: 98.7 F (37.1 C)  TempSrc: Oral  Resp: 16  Weight: 259 lb (117.482 kg)      Assessment & Plan:   [name] is a 69 y.o. male Need for influenza vaccination - Plan: Flu Vaccine QUAD 36+ mos IM given  Diabetes type 2, controlled - Plan: POCT glucose (manual entry), POCT glycosylated hemoglobin (Hb A1C), Microalbumin, urine, metFORMIN (GLUCOPHAGE) 500 MG tablet  -Overall controlled. Continue metformin 500 mg daily. Urine microalbumin pending. Recheck 6 months.  Hyperlipidemia - Plan: Comprehensive metabolic panel,  Lipid panel, atorvastatin (LIPITOR) 10 MG tablet  -Prior controlled. Lipid panel, CMP pending. Continue Lipitor 10 mg daily.  Essential hypertension - Plan: Comprehensive metabolic panel, losartan-hydrochlorothiazide (HYZAAR) 100-12.5 MG per tablet  -Borderline in office, check home blood pressure readings, if running over  140/90, call to discuss medication changes.  Chronic ankle pain, left - Plan: indomethacin (INDOCIN) 50 MG capsule, diclofenac sodium (VOLTAREN) 1 % GEL  -Risks/benefits were discussed for various treatments of his ankle pain. Although he is only using indomethacin approximately once a day, cardiac and renal risks discussed. He has had some breakthrough pain. With his underlying hypertension, hyperlipidemia, and diabetes, although well controlled, did discuss cardiac risks with NSAIDs in general. Could  consider medication such as tramadol, but there are other side effects of that medication including sedation. Can try Voltaren gel if this is not too costly, as it may also lessen the amount of risk from NSAIDs both cardiac and renal. He will check into this, and if cost effective, will start Voltaren gel in place of indomethacin. Discussed to use one or the other not both. Follow-up in 6 months.  Meds ordered this encounter  Medications  . atorvastatin (LIPITOR) 10 MG tablet    Sig: Take 1 tablet (10 mg total) by mouth daily.    Dispense:  90 tablet    Refill:  1  . indomethacin (INDOCIN) 50 MG capsule    Sig: Take 1 capsule (50 mg total) by mouth 3 (three) times daily as needed for moderate pain.    Dispense:  90 capsule    Refill:  1  . losartan-hydrochlorothiazide (HYZAAR) 100-12.5 MG per tablet    Sig: Take 1 tablet by mouth daily.    Dispense:  90 tablet    Refill:  1  . metFORMIN (GLUCOPHAGE) 500 MG tablet    Sig: Take 1 tablet (500 mg total) by mouth daily with breakfast.    Dispense:  90 tablet    Refill:  1  . diclofenac sodium (VOLTAREN) 1 % GEL    Sig: Apply  4 g topically 4 (four) times daily.    Dispense:  100 g    Refill:  1   Patient Instructions  Blood pressure slightly elevated here today. Keep a record of your blood pressures outside of the office and bring them to the next office visit. If running over 140/90 - let me know as we may need to adjust your meds.  Walking for exercise, or pool based exercise/swimming may be easier on your joints. 150 minutes per week is recommended minimum.  In place of Indomethacin - you could use the diclofenac topical up to 4 times per day if it is covered by your insurance. This would probably be safer than oral indomethacin.  If Indomethacin is needed, you can use it up to 3 times per day, but if you are requiring this medicine persistently more than once per day, would recommend we look at another medicine that may be safer for heart and kidneys. Let me know if this is the case and we can discuss other options like tramadol for breakthrough pain. Use either the topical diclofenac OR indomethacin pills - NOT both.   Return to the clinic or go to the nearest emergency room if any of your symptoms worsen or new symptoms occur.  Plan on physical in 6 months.     I personally performed the services described in this documentation, which was scribed in my presence. The recorded information has been reviewed and considered, and addended by me as needed.

## 2014-12-10 NOTE — Patient Instructions (Addendum)
Blood pressure slightly elevated here today. Keep a record of your blood pressures outside of the office and bring them to the next office visit. If running over 140/90 - let me know as we may need to adjust your meds.  Walking for exercise, or pool based exercise/swimming may be easier on your joints. 150 minutes per week is recommended minimum.  In place of Indomethacin - you could use the diclofenac topical up to 4 times per day if it is covered by your insurance. This would probably be safer than oral indomethacin.  If Indomethacin is needed, you can use it up to 3 times per day, but if you are requiring this medicine persistently more than once per day, would recommend we look at another medicine that may be safer for heart and kidneys. Let me know if this is the case and we can discuss other options like tramadol for breakthrough pain. Use either the topical diclofenac OR indomethacin pills - NOT both.   Return to the clinic or go to the nearest emergency room if any of your symptoms worsen or new symptoms occur.  Plan on physical in 6 months.

## 2014-12-11 LAB — MICROALBUMIN, URINE: Microalb, Ur: 0.6 mg/dL (ref <2.0)

## 2014-12-12 ENCOUNTER — Encounter: Payer: Self-pay | Admitting: Family Medicine

## 2014-12-15 ENCOUNTER — Ambulatory Visit (INDEPENDENT_AMBULATORY_CARE_PROVIDER_SITE_OTHER): Payer: BLUE CROSS/BLUE SHIELD | Admitting: Emergency Medicine

## 2014-12-15 ENCOUNTER — Ambulatory Visit (INDEPENDENT_AMBULATORY_CARE_PROVIDER_SITE_OTHER): Payer: BLUE CROSS/BLUE SHIELD

## 2014-12-15 VITALS — BP 146/78 | HR 81 | Temp 99.0°F | Ht 72.25 in | Wt 259.0 lb

## 2014-12-15 DIAGNOSIS — M25561 Pain in right knee: Secondary | ICD-10-CM

## 2014-12-15 DIAGNOSIS — M1000 Idiopathic gout, unspecified site: Secondary | ICD-10-CM

## 2014-12-15 LAB — POCT CBC
GRANULOCYTE PERCENT: 79.9 % (ref 37–80)
HEMATOCRIT: 42.6 % — AB (ref 43.5–53.7)
Hemoglobin: 14.5 g/dL (ref 14.1–18.1)
Lymph, poc: 1.2 (ref 0.6–3.4)
MCH: 32 pg — AB (ref 27–31.2)
MCHC: 34.1 g/dL (ref 31.8–35.4)
MCV: 93.8 fL (ref 80–97)
MID (CBC): 0.8 (ref 0–0.9)
MPV: 6.5 fL (ref 0–99.8)
POC GRANULOCYTE: 7.8 — AB (ref 2–6.9)
POC LYMPH %: 12.2 % (ref 10–50)
POC MID %: 7.9 % (ref 0–12)
Platelet Count, POC: 237 10*3/uL (ref 142–424)
RBC: 4.54 M/uL — AB (ref 4.69–6.13)
RDW, POC: 13.4 %
WBC: 9.7 10*3/uL (ref 4.6–10.2)

## 2014-12-15 LAB — URIC ACID: Uric Acid, Serum: 6.4 mg/dL (ref 4.0–7.8)

## 2014-12-15 MED ORDER — COLCHICINE 0.6 MG PO TABS: ORAL_TABLET | ORAL | Status: DC

## 2014-12-15 MED ORDER — DOXYCYCLINE HYCLATE 100 MG PO TABS: 100.0000 mg | ORAL_TABLET | Freq: Two times a day (BID) | ORAL | Status: DC

## 2014-12-15 NOTE — Progress Notes (Addendum)
Patient ID: [name], male   DOB: [birth year], 69 y.o.   MRN: [id number]    This chart was scribed for Jeffery Russian, MD by Ladene Artist, ED Scribe. This patient was seen in room 3 and the patient's care was started at 11:02 AM.  Chief Complaint:  Chief Complaint  Patient presents with  . Knee Pain    x's 3 days with swelling, redness and hot to touch per patient. Hx of Gout    HPI: Jeffery George is a 69 y.o. male, with a h/o DM and gout, who reports to [facility] today complaining of constant, gradually worsening right knee pain for the past 3 days. Pt states that he missed the bottom step of a ladder 3 days ago and twisted his foot but he denies injury to his knee at that time. He also denies right foot pain at this time. He reports that right knee pain is exacerbated with palpation and bearing weight. He has ambulated with crutches since onset of pain. Pt further reports associated redness and warmth to the affected area. He reports h/o gout and states that pain feels similar to gout but has lasted longer than previous episodes. His gout is treated with indomethacin, although he recently ran out and is awaiting a refill via the mail.   Past Medical History  Diagnosis Date  . Arthritis   . Diabetes mellitus without complication   . Heart murmur    Past Surgical History  Procedure Laterality Date  . Fracture surgery      Lt ankle  . Hernia repair    . Vasectomy     Social History   Social History  . Marital Status: Married    Spouse Name: N/A  . Number of Children: N/A  . Years of Education: N/A   Social History Main Topics  . Smoking status: Former Research scientist (life sciences)  . Smokeless tobacco: Not on file  . Alcohol Use: No  . Drug Use: No  . Sexual Activity: Not on file   Other Topics Concern  . Not on file   Social History Narrative   Married   Counsellor at [employer]   Family History  Problem Relation Age of Onset  . Cancer Mother   . Diabetes Mother   . Stroke  Mother   . Heart disease Father   . Stroke Father   . Diabetes Brother    Not on File Prior to Admission medications   Medication Sig Start Date End Date Taking? Authorizing Provider  ACCU-CHEK SOFTCLIX LANCETS lancets TEST BLOOD SUGAR DAILY 11/29/14  Yes [name], MD  aspirin EC 81 MG tablet Take 1 tablet (81 mg total) by mouth daily. 02/27/13  Yes Shawnee Knapp, MD  atorvastatin (LIPITOR) 10 MG tablet Take 1 tablet (10 mg total) by mouth daily. 12/10/14  Yes [name], MD  Blood Glucose Monitoring Suppl (BLOOD GLUCOSE MONITOR KIT) KIT Use to test blood sugar twice daily. 07/17/13  Yes [name], MD  diclofenac sodium (VOLTAREN) 1 % GEL Apply 4 g topically 4 (four) times daily. 12/10/14  Yes [name], MD  indomethacin (INDOCIN) 50 MG capsule Take 1 capsule (50 mg total) by mouth 3 (three) times daily as needed for moderate pain. 12/10/14  Yes [name], MD  Lancets [facility] ULTRASOFT) lancets Use as instructed 06/05/13  Yes [name], MD  losartan-hydrochlorothiazide [facility]) 100-12.5 MG per tablet Take 1 tablet by mouth daily.  12/10/14  Yes [name], MD  metFORMIN (GLUCOPHAGE) 500 MG tablet Take 1 tablet (500 mg total) by mouth daily with breakfast. 12/10/14  Yes [name], MD  Omega-3 Fatty Acids (FISH OIL) 1000 MG CAPS Take by mouth.   Yes Historical Provider, MD  ONE TOUCH ULTRA TEST test strip TEST BLOOD SUGAR DAILY 09/04/14  Yes [name], MD     ROS: The patient denies fevers, chills, night sweats, unintentional weight loss, chest pain, palpitations, wheezing, dyspnea on exertion, nausea, vomiting, abdominal pain, dysuria, hematuria, melena, numbness, weakness, or tingling. + arthralgias, + joint swelling, + color change  All other systems have been reviewed and were otherwise negative with the exception of those mentioned in the HPI and as above.    PHYSICAL EXAM: Filed Vitals:   12/15/14 0959  BP: 146/78  Pulse: 81  Temp:  99 F (37.2 C)   Body mass index is 34.89 kg/(m^2).   General: Alert, no acute distress HEENT:  Normocephalic, atraumatic, oropharynx patent. Eye: Juliette Mangle [facility] Cardiovascular:  Regular rate and rhythm, no rubs murmurs or gallops.  No Carotid bruits, radial pulse intact. No pedal edema.  Respiratory: Clear to auscultation bilaterally.  No wheezes, rales, or rhonchi.  No cyanosis, no use of accessory musculature Abdominal: No organomegaly, abdomen is soft and non-tender, positive bowel sounds.  No masses. Musculoskeletal: Gait intact. No edema, tenderness. R knee: redness and swelling over the kneecap measuring 3x3 inches. Pain with flexion.  Skin: No rashes. Neurologic: Facial musculature symmetric. Psychiatric: Patient acts appropriately throughout our interaction. Lymphatic: No cervical or submandibular lymphadenopathy Genitourinary/Anorectal: No acute findings   LABS: Results for orders placed or performed in visit on 12/15/14  POCT CBC  Result Value Ref Range   WBC 9.7 4.6 - 10.2 K/uL   Lymph, poc 1.2 0.6 - 3.4   POC LYMPH PERCENT 12.2 10 - 50 %L   MID (cbc) 0.8 0 - 0.9   POC MID % 7.9 0 - 12 %M   POC Granulocyte 7.8 (A) 2 - 6.9   Granulocyte percent 79.9 37 - 80 %G   RBC 4.54 (A) 4.69 - 6.13 M/uL   Hemoglobin 14.5 14.1 - 18.1 g/dL   HCT, POC 42.6 (A) 43.5 - 53.7 %   MCV 93.8 80 - 97 fL   MCH, POC 32.0 (A) 27 - 31.2 pg   MCHC 34.1 31.8 - 35.4 g/dL   RDW, POC 13.4 %   Platelet Count, POC 237 142 - 424 K/uL   MPV 6.5 0 - 99.8 fL    EKG/XRAY:   Primary read interpreted by Dr. Everlene Farrier at [facility]. There is some soft tissue calcification in the popliteal area most likely in the vascular system. There are mild degenerative changes. No fracture is seen.   ASSESSMENT/PLAN: His most likely represents acute gout in the prepatellar bursa. We'll cover with doxycycline twice a day for 10 days he will be on colchicine 0.6 twice a day.I personally performed the services described in this  documentation, which was scribed in my presence. The recorded information has been reviewed and is accurate.    Gross sideeffects, risk and benefits, and alternatives of medications d/w patient. Patient is aware that all medications have potential sideeffects and we are unable to predict every sideeffect or drug-drug interaction that may occur.  Arlyss Queen MD 12/15/2014 11:48 AM

## 2014-12-15 NOTE — Patient Instructions (Signed)
Do not take your Lipitor while you are on colchicine   . Gout Gout is an inflammatory arthritis caused by a buildup of uric acid crystals in the joints. Uric acid is a chemical that is normally present in the blood. When the level of uric acid in the blood is too high it can form crystals that deposit in your joints and tissues. This causes joint redness, soreness, and swelling (inflammation). Repeat attacks are common. Over time, uric acid crystals can form into masses (tophi) near a joint, destroying bone and causing disfigurement. Gout is treatable and often preventable. CAUSES  The disease begins with elevated levels of uric acid in the blood. Uric acid is produced by your body when it breaks down a naturally found substance called purines. Certain foods you eat, such as meats and fish, contain high amounts of purines. Causes of an elevated uric acid level include:  Being passed down from parent to child (heredity).  Diseases that cause increased uric acid production (such as obesity, psoriasis, and certain cancers).  Excessive alcohol use.  Diet, especially diets rich in meat and seafood.  Medicines, including certain cancer-fighting medicines (chemotherapy), water pills (diuretics), and aspirin.  Chronic kidney disease. The kidneys are no longer able to remove uric acid well.  Problems with metabolism. Conditions strongly associated with gout include:  Obesity.  High blood pressure.  High cholesterol.  Diabetes. Not everyone with elevated uric acid levels gets gout. It is not understood why some people get gout and others do not. Surgery, joint injury, and eating too much of certain foods are some of the factors that can lead to gout attacks. SYMPTOMS   An attack of gout comes on quickly. It causes intense pain with redness, swelling, and warmth in a joint.  Fever can occur.  Often, only one joint is involved. Certain joints are more commonly involved:  Base of the big  toe.  Knee.  Ankle.  Wrist.  Finger. Without treatment, an attack usually goes away in a few days to weeks. Between attacks, you usually will not have symptoms, which is different from many other forms of arthritis. DIAGNOSIS  Your caregiver will suspect gout based on your symptoms and exam. In some cases, tests may be recommended. The tests may include:  Blood tests.  Urine tests.  X-rays.  Joint fluid exam. This exam requires a needle to remove fluid from the joint (arthrocentesis). Using a microscope, gout is confirmed when uric acid crystals are seen in the joint fluid. TREATMENT  There are two phases to gout treatment: treating the sudden onset (acute) attack and preventing attacks (prophylaxis).  Treatment of an Acute Attack.  Medicines are used. These include anti-inflammatory medicines or steroid medicines.  An injection of steroid medicine into the affected joint is sometimes necessary.  The painful joint is rested. Movement can worsen the arthritis.  You may use warm or cold treatments on painful joints, depending which works best for you.  Treatment to Prevent Attacks.  If you suffer from frequent gout attacks, your caregiver may advise preventive medicine. These medicines are started after the acute attack subsides. These medicines either help your kidneys eliminate uric acid from your body or decrease your uric acid production. You may need to stay on these medicines for a very long time.  The early phase of treatment with preventive medicine can be associated with an increase in acute gout attacks. For this reason, during the first few months of treatment, your caregiver may also  advise you to take medicines usually used for acute gout treatment. Be sure you understand your caregiver's directions. Your caregiver may make several adjustments to your medicine dose before these medicines are effective.  Discuss dietary treatment with your caregiver or dietitian.  Alcohol and drinks high in sugar and fructose and foods such as meat, poultry, and seafood can increase uric acid levels. Your caregiver or dietitian can advise you on drinks and foods that should be limited. HOME CARE INSTRUCTIONS   Do not take aspirin to relieve pain. This raises uric acid levels.  Only take over-the-counter or prescription medicines for pain, discomfort, or fever as directed by your caregiver.  Rest the joint as much as possible. When in bed, keep sheets and blankets off painful areas.  Keep the affected joint raised (elevated).  Apply warm or cold treatments to painful joints. Use of warm or cold treatments depends on which works best for you.  Use crutches if the painful joint is in your leg.  Drink enough fluids to keep your urine clear or pale yellow. This helps your body get rid of uric acid. Limit alcohol, sugary drinks, and fructose drinks.  Follow your dietary instructions. Pay careful attention to the amount of protein you eat. Your daily diet should emphasize fruits, vegetables, whole grains, and fat-free or low-fat milk products. Discuss the use of coffee, vitamin C, and cherries with your caregiver or dietitian. These may be helpful in lowering uric acid levels.  Maintain a healthy body weight. SEEK MEDICAL CARE IF:   You develop diarrhea, vomiting, or any side effects from medicines.  You do not feel better in 24 hours, or you are getting worse. SEEK IMMEDIATE MEDICAL CARE IF:   Your joint becomes suddenly more tender, and you have chills or a fever. MAKE SURE YOU:   Understand these instructions.  Will watch your condition.  Will get help right away if you are not doing well or get worse. Document Released: 03/27/2000 Document Revised: 08/14/2013 Document Reviewed: 11/11/2011 [facility] Patient Information 2015 [place], [place]. This information is not intended to replace advice given to you by your health care provider. Make sure you discuss any  questions you have with your health care provider.

## 2014-12-21 ENCOUNTER — Telehealth: Payer: Self-pay

## 2014-12-21 NOTE — Telephone Encounter (Signed)
I have no idea what mitagare is. If that generic colchicine.???

## 2014-12-21 NOTE — Telephone Encounter (Signed)
Do you want this pt to use Mitagare or Colcrys. Pharmacy wants to change Rx to [place]. Please advise.  [phone]  [phone]

## 2014-12-23 NOTE — Telephone Encounter (Signed)
No, mitigare is Colchicine capsule instead of Tablet, it is better covered on some insurance plans, but only difference is capsule form, not tablet

## 2014-12-23 NOTE — Telephone Encounter (Signed)
It is fine to change him to the capsule.

## 2014-12-24 NOTE — Telephone Encounter (Signed)
Spoke to pharmacist at [facility] and gave OK to fill with the Mitigare  capsules.

## 2015-05-27 ENCOUNTER — Telehealth: Payer: Self-pay | Admitting: Family Medicine

## 2015-05-27 NOTE — Telephone Encounter (Signed)
LEFT A MESSAGE FOR PATIENT TO RETURN CALL TO SEE IF HE HAD HIS COLONOSCOPY AND IF SO WHERE AND WHEN.  IF NOT, MAY WE HELP HIM GET IT ORDERED?

## 2015-05-31 ENCOUNTER — Other Ambulatory Visit: Payer: Self-pay | Admitting: Family Medicine

## 2015-06-03 ENCOUNTER — Telehealth: Payer: Self-pay | Admitting: Family Medicine

## 2015-06-03 NOTE — Telephone Encounter (Signed)
Patient is returning call about his colonoscopy.  He has never had one, and he would like to speak with Dr. Carlota Raspberry before scheduling one.

## 2015-06-10 ENCOUNTER — Encounter: Payer: BLUE CROSS/BLUE SHIELD | Admitting: Family Medicine

## 2015-06-12 ENCOUNTER — Ambulatory Visit (INDEPENDENT_AMBULATORY_CARE_PROVIDER_SITE_OTHER): Payer: BLUE CROSS/BLUE SHIELD | Admitting: Family Medicine

## 2015-06-12 ENCOUNTER — Encounter: Payer: Self-pay | Admitting: Gastroenterology

## 2015-06-12 ENCOUNTER — Encounter: Payer: Self-pay | Admitting: Family Medicine

## 2015-06-12 VITALS — BP 134/80 | HR 72 | Temp 98.7°F | Resp 16 | Ht 72.5 in | Wt 263.6 lb

## 2015-06-12 DIAGNOSIS — Z125 Encounter for screening for malignant neoplasm of prostate: Secondary | ICD-10-CM | POA: Diagnosis not present

## 2015-06-12 DIAGNOSIS — Z23 Encounter for immunization: Secondary | ICD-10-CM

## 2015-06-12 DIAGNOSIS — Z1159 Encounter for screening for other viral diseases: Secondary | ICD-10-CM

## 2015-06-12 DIAGNOSIS — E119 Type 2 diabetes mellitus without complications: Secondary | ICD-10-CM | POA: Diagnosis not present

## 2015-06-12 DIAGNOSIS — R350 Frequency of micturition: Secondary | ICD-10-CM

## 2015-06-12 DIAGNOSIS — I10 Essential (primary) hypertension: Secondary | ICD-10-CM

## 2015-06-12 DIAGNOSIS — R05 Cough: Secondary | ICD-10-CM | POA: Diagnosis not present

## 2015-06-12 DIAGNOSIS — Z0000 Encounter for general adult medical examination without abnormal findings: Secondary | ICD-10-CM | POA: Diagnosis not present

## 2015-06-12 DIAGNOSIS — E785 Hyperlipidemia, unspecified: Secondary | ICD-10-CM | POA: Diagnosis not present

## 2015-06-12 DIAGNOSIS — Z114 Encounter for screening for human immunodeficiency virus [HIV]: Secondary | ICD-10-CM | POA: Diagnosis not present

## 2015-06-12 DIAGNOSIS — R059 Cough, unspecified: Secondary | ICD-10-CM

## 2015-06-12 DIAGNOSIS — Z1211 Encounter for screening for malignant neoplasm of colon: Secondary | ICD-10-CM

## 2015-06-12 LAB — POC MICROSCOPIC URINALYSIS (UMFC): MUCUS RE: ABSENT

## 2015-06-12 LAB — COMPLETE METABOLIC PANEL WITH GFR
ALBUMIN: 4.6 g/dL (ref 3.6–5.1)
ALK PHOS: 44 U/L (ref 40–115)
ALT: 37 U/L (ref 9–46)
AST: 25 U/L (ref 10–35)
BUN: 21 mg/dL (ref 7–25)
CO2: 28 mmol/L (ref 20–31)
Calcium: 9.5 mg/dL (ref 8.6–10.3)
Chloride: 104 mmol/L (ref 98–110)
Creat: 0.91 mg/dL (ref 0.70–1.25)
GFR, EST NON AFRICAN AMERICAN: 86 mL/min (ref >=60)
GFR, Est African American: >89 mL/min (ref >=60)
GLUCOSE: 110 mg/dL — AB (ref 65–99)
POTASSIUM: 4.4 mmol/L (ref 3.5–5.3)
SODIUM: 142 mmol/L (ref 135–146)
Total Bilirubin: 0.7 mg/dL (ref 0.2–1.2)
Total Protein: 7.6 g/dL (ref 6.1–8.1)

## 2015-06-12 LAB — LIPID PANEL
CHOL/HDL RATIO: 3.1 ratio (ref <=5.0)
Cholesterol: 128 mg/dL (ref 125–200)
HDL: 41 mg/dL (ref >=40)
LDL Cholesterol: 54 mg/dL (ref <130)
Triglycerides: 166 mg/dL — ABNORMAL HIGH (ref <150)
VLDL: 33 mg/dL — ABNORMAL HIGH (ref <30)

## 2015-06-12 LAB — HEPATITIS C ANTIBODY: HCV Ab: NEGATIVE

## 2015-06-12 LAB — POCT URINALYSIS DIP (MANUAL ENTRY)
BILIRUBIN UA: NEGATIVE
Bilirubin, UA: NEGATIVE
Blood, UA: NEGATIVE
Glucose, UA: NEGATIVE
Leukocytes, UA: NEGATIVE
NITRITE UA: NEGATIVE
PROTEIN UA: NEGATIVE
Spec Grav, UA: 1.02
Urobilinogen, UA: 0.2
pH, UA: 6.5

## 2015-06-12 LAB — POCT GLYCOSYLATED HEMOGLOBIN (HGB A1C): HEMOGLOBIN A1C: 6.4

## 2015-06-12 LAB — HIV ANTIBODY (ROUTINE TESTING W REFLEX): HIV 1&2 Ab, 4th Generation: NONREACTIVE

## 2015-06-12 MED ORDER — METFORMIN HCL 500 MG PO TABS: 500.0000 mg | ORAL_TABLET | Freq: Every day | ORAL | Status: DC

## 2015-06-12 MED ORDER — ATORVASTATIN CALCIUM 10 MG PO TABS: 10.0000 mg | ORAL_TABLET | Freq: Every day | ORAL | Status: DC

## 2015-06-12 MED ORDER — LOSARTAN POTASSIUM-HCTZ 100-12.5 MG PO TABS: 1.0000 | ORAL_TABLET | Freq: Every day | ORAL | Status: DC

## 2015-06-12 NOTE — Patient Instructions (Addendum)
You should receive a call or letter about your lab results within the next week to 10 days.   I will refer you to gastroenterologist to discuss screening colonoscopy, but you can discuss Cologuard at that visit  For cough - try zantac over the counter 1-2 times per day and claritin over the counter once per day for next 2 weeks.  If cough improves, stop one of these at a time. If cough returns - then this will let you know if allergy or heartburn and continue the effective medicine. If cough not improving in next 2-4 weeks with these treatments - return for other evaluation or xray.   Return to the clinic or go to the nearest emergency room if any of your symptoms worsen or new symptoms occur.  Keeping you healthy  Get these tests  Blood pressure- Have your blood pressure checked once a year by your healthcare provider.  Normal blood pressure is 120/80  Weight- Have your body mass index (BMI) calculated to screen for obesity.  BMI is a measure of body fat based on height and weight. You can also calculate your own BMI at [email or web address].  Cholesterol- Have your cholesterol checked every year.  Diabetes- Have your blood sugar checked regularly if you have high blood pressure, high cholesterol, have a family history of diabetes or if you are overweight.  Screening for Colon Cancer- Colonoscopy starting at age 60.  Screening may begin sooner depending on your family history and other health conditions. Follow up colonoscopy as directed by your Gastroenterologist.  Screening for Prostate Cancer- Both blood work (PSA) and a rectal exam help screen for Prostate Cancer.  Screening begins at age 68 with African-American men and at age 75 with Caucasian men.  Screening may begin sooner depending on your family history.  Take these medicines  Aspirin- One aspirin daily can help prevent Heart disease and Stroke.  Flu shot- Every fall.  Tetanus- Every 10 years.  Zostavax- Once after the  age of 47 to prevent Shingles.  Pneumonia shot- Once after the age of 64; if you are younger than 50, ask your healthcare provider if you need a Pneumonia shot.  Take these steps  Don't smoke- If you do smoke, talk to your doctor about quitting.  For tips on how to quit, go to www.smokefree.gov or call 1-800-QUIT-NOW.  Be physically active- Exercise 5 days a week for at least 30 minutes.  If you are not already physically active start slow and gradually work up to 30 minutes of moderate physical activity.  Examples of moderate activity include walking briskly, mowing the yard, dancing, swimming, bicycling, etc.  Eat a healthy diet- Eat a variety of healthy food such as fruits, vegetables, low fat milk, low fat cheese, yogurt, lean meant, poultry, fish, beans, tofu, etc. For more information go to www.thenutritionsource.org  Drink alcohol in moderation- Limit alcohol intake to less than two drinks a day. Never drink and drive.  Dentist- Brush and floss twice daily; visit your dentist twice a year.  Depression- Your emotional health is as important as your physical health. If you're feeling down, or losing interest in things you would normally enjoy please talk to your healthcare provider.  Eye exam- Visit your eye doctor every year.  Safe sex- If you may be exposed to a sexually transmitted infection, use a condom.  Seat belts- Seat belts can save your life; always wear one.  Smoke/Carbon Monoxide detectors- These detectors need to be installed on the appropriate  level of your home.  Replace batteries at least once a year.  Skin cancer- When out in the sun, cover up and use sunscreen 15 SPF or higher.  Violence- If anyone is threatening you, please tell your healthcare provider.  Living Will/ Health care power of attorney- Speak with your healthcare provider and family. 

## 2015-06-12 NOTE — Progress Notes (Signed)
   Subjective:    Patient ID: Jeffery George, male    DOB: 07-04-1945, 70 y.o.   MRN: [id number]  HPI    Review of Systems  Constitutional: Negative.   HENT: Negative.   Eyes: Positive for visual disturbance.  Respiratory: Negative.   Cardiovascular: Negative.   Gastrointestinal: Negative.   Endocrine: Positive for polyuria.  Genitourinary: Negative.   Musculoskeletal: Negative.   Skin: Negative.   Allergic/Immunologic: Negative.   Neurological: Negative.   Hematological: Negative.   Psychiatric/Behavioral: Negative.        Objective:   Physical Exam        Assessment & Plan:

## 2015-06-12 NOTE — Progress Notes (Signed)
Subjective:    Patient ID: Jeffery George, male    DOB: [birth year], 70 y.o.   MRN: [id number] By signing my name below, I, Zola Button, attest that this documentation has been prepared under the direction and in the presence of Merri Ray, MD.  Electronically Signed: Zola Button, Medical Scribe. 06/12/2015. 10:24 AM.  HPI HPI Comments: Jeffery George is a 70 y.o. male with a history of hypertension, DM, HLD, and chronic ankle pain, who presents to the [facility] for a complete physical exam.   Cancer screening:  Colon cancer screening - He has not had a colonoscopy. He has some questions about Cologuard. Patient is willing to see a gastroenterologist and to have a colonoscopy. Prostate cancer screening - He agrees to prostate cancer screening today. Lab Results  Component Value Date   PSA 0.86 05/01/2013    Immunizations: He does need Prevnar today. Immunization History  Administered Date(s) Administered  . Influenza,inj,Quad PF,36+ Mos 02/27/2013, 06/11/2014, 12/10/2014  . Pneumococcal Polysaccharide-23 05/01/2013  . Tdap 05/01/2013  . Zoster 05/09/2013    Depression screening:  Depression screen [facility] 2/9 06/12/2015 12/15/2014 12/10/2014 07/17/2013 07/17/2013  Decreased Interest 0 0 0 0 0  Down, Depressed, Hopeless 0 0 0 0 0  PHQ - 2 Score 0 0 0 0 0   Fall screening: No falls in the past year.  Functional status screening: Some difficulty with seeing and difficulty climbing stairs due to previous ankle injury.  Hypertension: He takes losartan-HCT. Patient denies side effects. Lab Results  Component Value Date   CREATININE 0.91 12/10/2014    Hyperlipidemia: Takes Lipitor 10 mg qd. Patient denies side effects. Lab Results  Component Value Date   CHOL 103* 12/10/2014   HDL 42 12/10/2014   LDLCALC 48 12/10/2014   TRIG 66 12/10/2014   CHOLHDL 2.5 12/10/2014    Lab Results  Component Value Date   ALT 33 12/10/2014   AST 22 12/10/2014   ALKPHOS 41  12/10/2014   BILITOT 0.5 12/10/2014    Diabetes: On metformin 500 mg qd. His blood sugar is normally around 110-118 at home. Lab Results  Component Value Date   HGBA1C 6.1 12/10/2014    Lab Results  Component Value Date   MICROALBUR 0.6 12/10/2014    Vision: He has an appointment in June of this year.  Visual Acuity Screening   Right eye Left eye Both eyes  Without correction:     With correction: 20/40 20/30 20/25     Cough: He also complains of dry cough today. The cough is worse in the morning for the first 30 minutes after waking up. He thinks this may be due to a medication. Patient denies heartburn, rhinorrhea, and allergy symptoms.  HIV testing: He agrees to be tested.  Hepatitis C testing: He agrees to be tested.  Increased urination: Patient states he has had this for several years and attributes this to aging. He has about 2 episodes of nocturia a night. This has actually been improving over the past few weeks. Patient denies hematuria.  Patient Active Problem List   Diagnosis Date Noted  . Gout 02/27/2013  . Essential hypertension, benign 02/27/2013  . Type II or unspecified type diabetes mellitus without mention of complication, uncontrolled 02/27/2013   Past Medical History  Diagnosis Date  . Arthritis   . Diabetes mellitus without complication ([place])   . Heart murmur    Past Surgical History  Procedure Laterality Date  . Fracture surgery  Lt ankle  . Hernia repair    . Vasectomy     No Known Allergies Prior to Admission medications   Medication Sig Start Date End Date Taking? Authorizing Provider  ACCU-CHEK SOFTCLIX LANCETS lancets TEST BLOOD SUGAR DAILY 11/29/14   Shade Flood, MD  aspirin EC 81 MG tablet Take 1 tablet (81 mg total) by mouth daily. 02/27/13   Sherren Mocha, MD  atorvastatin (LIPITOR) 10 MG tablet Take 1 tablet (10 mg total) by mouth daily. 12/10/14   Shade Flood, MD  Blood Glucose Monitoring Suppl (BLOOD GLUCOSE MONITOR KIT) KIT  Use to test blood sugar twice daily. 07/17/13   Shade Flood, MD  colchicine 0.6 MG tablet Take 1 tablet twice a day. 12/15/14   Collene Gobble, MD  diclofenac sodium (VOLTAREN) 1 % GEL Apply 4 g topically 4 (four) times daily. 12/10/14   Shade Flood, MD  doxycycline (VIBRA-TABS) 100 MG tablet Take 1 tablet (100 mg total) by mouth 2 (two) times daily. 12/15/14   Collene Gobble, MD  glucose blood (ONE TOUCH ULTRA TEST) test strip Use to test blood sugar once daily. Dx: E11.9 05/31/15   Shade Flood, MD  indomethacin (INDOCIN) 50 MG capsule Take 1 capsule (50 mg total) by mouth 3 (three) times daily as needed for moderate pain. 12/10/14   Shade Flood, MD  Lancets [facility] ULTRASOFT) lancets Use as instructed 06/05/13   Shade Flood, MD  losartan-hydrochlorothiazide [facility]) 100-12.5 MG per tablet Take 1 tablet by mouth daily. 12/10/14   Shade Flood, MD  metFORMIN (GLUCOPHAGE) 500 MG tablet Take 1 tablet (500 mg total) by mouth daily with breakfast. 12/10/14   Shade Flood, MD  Omega-3 Fatty Acids (FISH OIL) 1000 MG CAPS Take by mouth.    Historical Provider, MD   Social History   Social History  . Marital Status: Married    Spouse Name: N/A  . Number of Children: N/A  . Years of Education: N/A   Occupational History  . Warehouse Dispatcher    Social History Main Topics  . Smoking status: Former Games developer  . Smokeless tobacco: Not on file  . Alcohol Use: No  . Drug Use: No  . Sexual Activity: Not on file   Other Topics Concern  . Not on file   Social History Narrative   Married   Science writer at [employer]     Review of Systems  Eyes: Positive for visual disturbance.  Endocrine: Positive for polyuria.  All other systems reviewed and are negative. 13 point ROS reviewed on patient health survey. Negative other than listed above or in nursing note. See nursing note.     Objective:   Physical Exam  Constitutional: He is oriented to person, place, and time.  He appears well-developed and well-nourished.  HENT:  Head: Normocephalic and atraumatic.  Right Ear: External ear normal.  Left Ear: External ear normal.  Mouth/Throat: Oropharynx is clear and moist.  Eyes: Conjunctivae and EOM are normal. Pupils are equal, round, and reactive to light.  Neck: Normal range of motion. Neck supple. No thyromegaly present.  Cardiovascular: Normal rate, regular rhythm, normal heart sounds and intact distal pulses.   Pulmonary/Chest: Effort normal and breath sounds normal. No respiratory distress. He has no wheezes.  Abdominal: Soft. He exhibits no distension. There is no tenderness. Hernia confirmed negative in the right inguinal area and confirmed negative in the left inguinal area.  Genitourinary: Prostate  normal.  Musculoskeletal: Normal range of motion. He exhibits no edema or tenderness.  Lymphadenopathy:    He has no cervical adenopathy.  Neurological: He is alert and oriented to person, place, and time. He has normal reflexes.  Skin: Skin is warm and dry.  Psychiatric: He has a normal mood and affect. His behavior is normal.  Vitals reviewed.   Filed Vitals:   06/12/15 0959 06/12/15 1012  BP: 150/82 134/80  Pulse: 72   Temp: 98.7 F (37.1 C)   TempSrc: Oral   Resp: 16   Height: 6' 0.5" (1.842 m)   Weight: 263 lb 9.6 oz (119.568 kg)   SpO2: 98%     Results for orders placed or performed in visit on 06/12/15  POCT glycosylated hemoglobin (Hb A1C)  Result Value Ref Range   Hemoglobin A1C 6.4   POCT urinalysis dipstick  Result Value Ref Range   Color, UA yellow yellow   Clarity, UA clear clear   Glucose, UA negative negative   Bilirubin, UA negative negative   Ketones, POC UA negative negative   Spec Grav, UA 1.020    Blood, UA negative negative   pH, UA 6.5    Protein Ur, POC negative negative   Urobilinogen, UA 0.2    Nitrite, UA Negative Negative   Leukocytes, UA Negative Negative  POCT Microscopic Urinalysis ([facility])  Result  Value Ref Range   WBC,UR,HPF,POC None None WBC/hpf   RBC,UR,HPF,POC None None RBC/hpf   Bacteria None None, Too numerous to count   Mucus Absent Absent   Epithelial Cells, UR Per Microscopy None None, Too numerous to count cells/hpf        Assessment & Plan:   Jeffery George is a 70 y.o. male Annual physical exam  --anticipatory guidance as below in AVS, screening labs above. Health maintenance items as above in HPI discussed/recommended as applicable.    Essential hypertension - Plan: COMPLETE METABOLIC PANEL WITH GFR, losartan-hydrochlorothiazide (HYZAAR) 100-12.5 MG tablet  -stable. Labs pending, meds refilled.    Hyperlipidemia - Plan: Lipid panel, atorvastatin (LIPITOR) 10 MG tablet  -labs pending. lipitor reordered.   Controlled type 2 diabetes mellitus without complication, without long-term current use of insulin ([place]) - Plan: HM Diabetes Foot Exam, POCT glycosylated hemoglobin (Hb A1C), metFORMIN (GLUCOPHAGE) 500 MG tablet  -controlled. No med changes, recheck in 3 months.   Need for prophylactic vaccination against Streptococcus pneumoniae (pneumococcus) - Plan: Pneumococcal conjugate vaccine 13-valent IM given.   Screen for colon cancer - Plan: Ambulatory referral to Gastroenterology  -referred to discuss colonoscopy vs cologuard.   Screening for prostate cancer - Plan: PSA  -We discussed pros and cons of prostate cancer screening, and after this discussion, he chose to have screening done. PSA obtained, and no concerning findings on DRE.   Screening for HIV (human immunodeficiency virus) - Plan: HIV antibody  Need for hepatitis C screening test - Plan: Hepatitis C antibody  Urinary frequency - Plan: PSA, POCT urinalysis dipstick, POCT Microscopic Urinalysis ([facility])  - psa pending. No concerning findings on U/a.  Improved recently.   Cough  -allergic rhinitis vs. GERD possible. Trial of zantac and zyrtec or claritin. If symptoms improve, can try to decrease one at a  time. If cough persists in next few weeks - return for recheck and possible CXR.   Meds ordered this encounter  Medications  . atorvastatin (LIPITOR) 10 MG tablet    Sig: Take 1 tablet (10 mg total) by mouth daily.  Dispense:  90 tablet    Refill:  1  . losartan-hydrochlorothiazide (HYZAAR) 100-12.5 MG tablet    Sig: Take 1 tablet by mouth daily.    Dispense:  90 tablet    Refill:  1  . metFORMIN (GLUCOPHAGE) 500 MG tablet    Sig: Take 1 tablet (500 mg total) by mouth daily with breakfast.    Dispense:  90 tablet    Refill:  1   Patient Instructions  You should receive a call or letter about your lab results within the next week to 10 days.   I will refer you to gastroenterologist to discuss screening colonoscopy, but you can discuss Cologuard at that visit  For cough - try zantac over the counter 1-2 times per day and claritin over the counter once per day for next 2 weeks.  If cough improves, stop one of these at a time. If cough returns - then this will let you know if allergy or heartburn and continue the effective medicine. If cough not improving in next 2-4 weeks with these treatments - return for other evaluation or xray.   Return to the clinic or go to the nearest emergency room if any of your symptoms worsen or new symptoms occur.  Keeping you healthy  Get these tests  Blood pressure- Have your blood pressure checked once a year by your healthcare provider.  Normal blood pressure is 120/80  Weight- Have your body mass index (BMI) calculated to screen for obesity.  BMI is a measure of body fat based on height and weight. You can also calculate your own BMI at ViewBanking.si.  Cholesterol- Have your cholesterol checked every year.  Diabetes- Have your blood sugar checked regularly if you have high blood pressure, high cholesterol, have a family history of diabetes or if you are overweight.  Screening for Colon Cancer- Colonoscopy starting at age 10.  Screening  may begin sooner depending on your family history and other health conditions. Follow up colonoscopy as directed by your Gastroenterologist.  Screening for Prostate Cancer- Both blood work (PSA) and a rectal exam help screen for Prostate Cancer.  Screening begins at age 47 with African-American men and at age 39 with Caucasian men.  Screening may begin sooner depending on your family history.  Take these medicines  Aspirin- One aspirin daily can help prevent Heart disease and Stroke.  Flu shot- Every fall.  Tetanus- Every 10 years.  Zostavax- Once after the age of 49 to prevent Shingles.  Pneumonia shot- Once after the age of 42; if you are younger than 46, ask your healthcare provider if you need a Pneumonia shot.  Take these steps  Don't smoke- If you do smoke, talk to your doctor about quitting.  For tips on how to quit, go to www.smokefree.gov or call 1-800-QUIT-NOW.  Be physically active- Exercise 5 days a week for at least 30 minutes.  If you are not already physically active start slow and gradually work up to 30 minutes of moderate physical activity.  Examples of moderate activity include walking briskly, mowing the yard, dancing, swimming, bicycling, etc.  Eat a healthy diet- Eat a variety of healthy food such as fruits, vegetables, low fat milk, low fat cheese, yogurt, lean meant, poultry, fish, beans, tofu, etc. For more information go to www.thenutritionsource.org  Drink alcohol in moderation- Limit alcohol intake to less than two drinks a day. Never drink and drive.  Dentist- Brush and floss twice daily; visit your dentist twice a year.  Depression- Your emotional health is as important as your physical health. If you're feeling down, or losing interest in things you would normally enjoy please talk to your healthcare provider.  Eye exam- Visit your eye doctor every year.  Safe sex- If you may be exposed to a sexually transmitted infection, use a condom.  Seat belts-  Seat belts can save your life; always wear one.  Smoke/Carbon Monoxide detectors- These detectors need to be installed on the appropriate level of your home.  Replace batteries at least once a year.  Skin cancer- When out in the sun, cover up and use sunscreen 15 SPF or higher.  Violence- If anyone is threatening you, please tell your healthcare provider.  Living Will/ Health care power of attorney- Speak with your healthcare provider and family.      I personally performed the services described in this documentation, which was scribed in my presence. The recorded information has been reviewed and considered, and addended by me as needed.

## 2015-06-13 LAB — PSA: PSA: 0.87 ng/mL (ref <=4.00)

## 2015-07-09 ENCOUNTER — Other Ambulatory Visit: Payer: Self-pay | Admitting: Family Medicine

## 2015-07-16 ENCOUNTER — Telehealth: Payer: Self-pay | Admitting: *Deleted

## 2015-07-16 NOTE — Telephone Encounter (Signed)
Per his last OV with his GP, note stated he wanted to see gastroenterologist to discuss cologuard vs colonoscopy.  I called the pt to make sure he is scheduled for a PV with an RN to get him ready for his colonoscopy.  I just wanted to make sure this is what he wants and doesn't want an appointment with an extender.  No answer on his home phone, so LMOM.  No ID on his cell phone; no message left

## 2015-07-18 ENCOUNTER — Ambulatory Visit (AMBULATORY_SURGERY_CENTER): Payer: Self-pay

## 2015-07-18 VITALS — Ht 72.0 in | Wt 261.0 lb

## 2015-07-18 DIAGNOSIS — Z1211 Encounter for screening for malignant neoplasm of colon: Secondary | ICD-10-CM

## 2015-07-18 MED ORDER — SUPREP BOWEL PREP KIT 17.5-3.13-1.6 GM/177ML PO SOLN: 1.0000 | Freq: Once | ORAL | Status: DC

## 2015-07-18 NOTE — Progress Notes (Signed)
No allergies to eggs or soy No past problems with anesthesia No diet/weight loss meds No home oxygen  Has email and internet; refused emmi 

## 2015-07-22 ENCOUNTER — Ambulatory Visit (INDEPENDENT_AMBULATORY_CARE_PROVIDER_SITE_OTHER): Payer: BLUE CROSS/BLUE SHIELD | Admitting: Physician Assistant

## 2015-07-22 VITALS — BP 144/80 | HR 92 | Temp 98.3°F | Resp 18 | Ht 72.0 in | Wt 258.2 lb

## 2015-07-22 DIAGNOSIS — I10 Essential (primary) hypertension: Secondary | ICD-10-CM

## 2015-07-22 DIAGNOSIS — M1009 Idiopathic gout, multiple sites: Secondary | ICD-10-CM

## 2015-07-22 DIAGNOSIS — M109 Gout, unspecified: Secondary | ICD-10-CM

## 2015-07-22 MED ORDER — AMLODIPINE BESYLATE 5 MG PO TABS: 5.0000 mg | ORAL_TABLET | Freq: Every day | ORAL | Status: DC

## 2015-07-22 MED ORDER — LOSARTAN POTASSIUM 100 MG PO TABS
100.0000 mg | ORAL_TABLET | Freq: Every day | ORAL | Status: DC
Start: 2015-07-22 — End: 2015-09-03

## 2015-07-22 MED ORDER — COLCHICINE 0.6 MG PO TABS: ORAL_TABLET | ORAL | Status: DC

## 2015-07-22 NOTE — Progress Notes (Signed)
[facility] [address], [place]  [address] [phone]  Date:  07/22/2015   Name:  Jeffery George   DOB:  10/26/1945   MRN:  [id number]  PCP:  GREENE,JEFFREY R, MD    Chief Complaint: Gout   History of Present Illness:  This is a 69 y.o. male with PMH DM, HTN, gout who is presenting with gout flare in left knee x 10 days. States this is the longest episode he has ever had. States pain is the same as at onset, although the warmth and redness have decreased some over the past couple days. States this is his 3rd episode in the past 4 months. Has had problems with gout x 12 years. States usually gets 3-4 episodes per year. Gout has worsened over the past 1.5 years. Uric acid has always been in normal range around 6.4. Joints that are affected are bilateral knees and bilateral ankles. A few times in his bilateral great toes but states not usually as bad when happens in his toes. Been taking indomethacin twice a day and helps a lot. Helps for about 4-5 hours. He has tried colchicine before and helps but didn't have any refills this time. Never been on any maintenance therapy. Does take a diuretic, hctz 12.5 mg daily. Start this about 1 year ago. He does well with his diet. Does not drink alcohol. Stays away from shellfish and red meat.  Review of Systems:  Review of Systems See HPI  Patient Active Problem List   Diagnosis Date Noted  . Gout 02/27/2013  . Essential hypertension, benign 02/27/2013  . Type II or unspecified type diabetes mellitus without mention of complication, uncontrolled 02/27/2013    Prior to Admission medications   Medication Sig Start Date End Date Taking? Authorizing Provider  ACCU-CHEK SOFTCLIX LANCETS lancets TEST BLOOD SUGAR DAILY 11/29/14  Yes Jeffrey R Greene, MD  aspirin EC 81 MG tablet Take 1 tablet (81 mg total) by mouth daily. 02/27/13  Yes Eva N Shaw, MD  atorvastatin (LIPITOR) 10 MG tablet Take 1 tablet (10 mg total) by mouth  daily. 06/12/15  Yes Jeffrey R Greene, MD  Blood Glucose Monitoring Suppl (BLOOD GLUCOSE MONITOR KIT) KIT Use to test blood sugar twice daily. 07/17/13  Yes Jeffrey R Greene, MD  glucose blood (ONE TOUCH ULTRA TEST) test strip Use to test blood sugar once daily. Dx: E11.9 05/31/15  Yes Jeffrey R Greene, MD  indomethacin (INDOCIN) 50 MG capsule TAKE 1 CAPSULE THREE TIMES A DAY AS NEEDED FOR MODERATE PAIN 07/11/15  Yes Jeffrey R Greene, MD  Lancets (ONETOUCH ULTRASOFT) lancets Use as instructed 06/05/13  Yes Jeffrey R Greene, MD  losartan-hydrochlorothiazide (HYZAAR) 100-12.5 MG tablet Take 1 tablet by mouth daily. 06/12/15  Yes Jeffrey R Greene, MD  metFORMIN (GLUCOPHAGE) 500 MG tablet Take 1 tablet (500 mg total) by mouth daily with breakfast. 06/12/15  Yes Jeffrey R Greene, MD  Omega-3 Fatty Acids (FISH OIL) 1000 MG CAPS Take by mouth.   Yes Historical Provider, MD  SUPREP BOWEL PREP SOLN Take 1 kit by mouth once. 07/18/15  Yes Kavitha Nandigam V, MD    No Known Allergies  Past Surgical History  Procedure Laterality Date  . Fracture surgery      Lt ankle  . Hernia repair    . Vasectomy      Social History  Substance Use Topics  . Smoking status: Former Smoker  . Smokeless tobacco: Never Used  . Alcohol Use:   No    Family History  Problem Relation Age of Onset  . Cancer Mother   . Diabetes Mother   . Stroke Mother   . Heart disease Father   . Stroke Father   . Diabetes Brother   . Heart disease Brother   . Diabetes Brother   . Colon cancer Neg Hx     Medication list has been reviewed and updated.  Physical Examination:  Physical Exam  Constitutional: He is oriented to person, place, and time. He appears well-developed and well-nourished. No distress.  HENT:  Head: Normocephalic and atraumatic.  Right Ear: Hearing normal.  Left Ear: Hearing normal.  Nose: Nose normal.  Eyes: Conjunctivae and lids are normal. Right eye exhibits no discharge. Left eye exhibits no discharge. No  scleral icterus.  Pulmonary/Chest: Effort normal. No respiratory distress.  Musculoskeletal: Normal range of motion.  Neurological: He is alert and oriented to person, place, and time.  Skin: Skin is warm, dry and intact.  Left knee erythema and swelling. + warmth  Psychiatric: He has a normal mood and affect. His speech is normal and behavior is normal. Thought content normal.   BP 144/80 mmHg  Pulse 92  Temp(Src) 98.3 F (36.8 C) (Oral)  Resp 18  Ht 6' (1.829 m)  Wt 258 lb 3.2 oz (117.119 kg)  BMI 35.01 kg/m2  SpO2 98%  Assessment and Plan:  1. Acute gout of multiple sites, unspecified cause 2. HTN, benign Prescribed colchicine - take 2 tabs and then repeat in 1 hour. Continue indomethacin 2-3 times a day as needed. He will stop the hyzaar since diuretic could be contributing to increased frequency gout episodes. Continue losartan 100 mg and add amlodipine 5 mg QD. We discussed possibility of starting allopurinol for maintenance. Will see if symptoms improve after d/c hctz, if not will start allopurinol. - colchicine (COLCRYS) 0.6 MG tablet; Take 2 tabs po at symptom onset. Repeat 1 tab po 1 hour later.  Dispense: 12 tablet; Refill: 0 - losartan (COZAAR) 100 MG tablet; Take 1 tablet (100 mg total) by mouth daily.  Dispense: 90 tablet; Refill: 1 - amLODipine (NORVASC) 5 MG tablet; Take 1 tablet (5 mg total) by mouth daily.  Dispense: 90 tablet; Refill: 1   Nicole V. Bush, PA-C, MHS [facility]  07/22/2015      

## 2015-07-22 NOTE — Patient Instructions (Addendum)
Take colchicine 2 tabs, take 1 tab one hour later. Do this in the future at the start of gout attacks Continue indomethacin, can take three times a day as needed for pain. Hold your lipitor today and tomorrow. Stop hyzaar.  Start taking losartan 100 mg each morning and amlodipine 5 mg each morning. Let's see how you do with stopping the diuretic. If still getting frequent attacks, we can start a drug called allopurinol for prevent gout.   IF you received an x-ray today, you will receive an invoice from [facility]. Please contact [facility] at [phone] with questions or concerns regarding your invoice.   IF you received labwork today, you will receive an invoice from [employer]. Please contact [facility] at [phone] with questions or concerns regarding your invoice.   Our billing staff will not be able to assist you with questions regarding bills from these companies.  You will be contacted with the lab results as soon as they are available. The fastest way to get your results is to activate your My Chart account. Instructions are located on the last page of this paperwork. If you have not heard from [place] regarding the results in 2 weeks, please contact this office.

## 2015-08-01 ENCOUNTER — Encounter: Payer: Self-pay | Admitting: Gastroenterology

## 2015-08-01 ENCOUNTER — Ambulatory Visit (AMBULATORY_SURGERY_CENTER): Payer: BLUE CROSS/BLUE SHIELD | Admitting: Gastroenterology

## 2015-08-01 VITALS — BP 110/62 | HR 58 | Temp 96.8°F | Resp 11 | Ht 72.0 in | Wt 261.0 lb

## 2015-08-01 DIAGNOSIS — K621 Rectal polyp: Secondary | ICD-10-CM

## 2015-08-01 DIAGNOSIS — K635 Polyp of colon: Secondary | ICD-10-CM

## 2015-08-01 DIAGNOSIS — D125 Benign neoplasm of sigmoid colon: Secondary | ICD-10-CM

## 2015-08-01 DIAGNOSIS — Z1211 Encounter for screening for malignant neoplasm of colon: Secondary | ICD-10-CM | POA: Diagnosis not present

## 2015-08-01 MED ORDER — SODIUM CHLORIDE 0.9 % IV SOLN: 500.0000 mL | INTRAVENOUS | Status: DC

## 2015-08-01 NOTE — Op Note (Signed)
[place] Patient Name: Jeffery George Procedure Date: 08/01/2015 8:07 AM MRN: [id number] Endoscopist: Mauri Pole , MD Age: 70 Date of Birth: July 11, 1945 Gender: Male Procedure:                Colonoscopy Indications:              Screening for colorectal malignant neoplasm Medicines:                Monitored Anesthesia Care Procedure:                Pre-Anesthesia Assessment:                           - Prior to the procedure, a History and Physical                            was performed, and patient medications and                            allergies were reviewed. The patient's tolerance of                            previous anesthesia was also reviewed. The risks                            and benefits of the procedure and the sedation                            options and risks were discussed with the patient.                            All questions were answered, and informed consent                            was obtained. Prior Anticoagulants: The patient                            last took aspirin 1 day prior to the procedure. ASA                            Grade Assessment: II - A patient with mild systemic                            disease. After reviewing the risks and benefits,                            the patient was deemed in satisfactory condition to                            undergo the procedure.                           After obtaining informed consent, the colonoscope  was passed under direct vision. Throughout the                            procedure, the patient's blood pressure, pulse, and                            oxygen saturations were monitored continuously. The                            Model CF-HQ190L [id number]) scope was introduced                            through the anus and advanced to the the cecum,                            identified by appendiceal orifice and ileocecal            valve. The colonoscopy was performed without                            difficulty. The patient tolerated the procedure                            well. The quality of the bowel preparation was                            good. The ileocecal valve, appendiceal orifice, and                            rectum were photographed. Scope In: 8:09:17 AM Scope Out: 8:29:50 AM Scope Withdrawal Time: 0 hours 17 minutes 4 seconds  Total Procedure Duration: 0 hours 20 minutes 33 seconds  Findings:                 The perianal exam findings include non-thrombosed                            external hemorrhoids.                           Four sessile polyps were found in the recto-sigmoid                            colon. The polyps were 4 to 6 mm in size. These                            polyps were removed with a cold snare. Resection                            and retrieval were complete.                           Non-bleeding internal hemorrhoids were found during  retroflexion. The hemorrhoids were small. Complications:            No immediate complications. Estimated Blood Loss:     Estimated blood loss: none. Impression:               - Non-thrombosed external hemorrhoids found on                            perianal exam.                           - Four 4 to 6 mm polyps at the recto-sigmoid colon,                            removed with a cold snare. Resected and retrieved.                           - Non-bleeding internal hemorrhoids. Recommendation:           - Patient has a contact number available for                            emergencies. The signs and symptoms of potential                            delayed complications were discussed with the                            patient. Return to normal activities tomorrow.                            Written discharge instructions were provided to the                            patient.                            - Resume previous diet.                           - Continue present medications.                           - Await pathology results.                           - Repeat colonoscopy in 5-10 years for surveillance.                           - No ibuprofen, naproxen, or other non-steroidal                            anti-inflammatory drugs for 5 days after polyp                            removal. Mauri Pole, MD 08/01/2015 8:35:19 AM This report has been signed electronically.

## 2015-08-01 NOTE — Progress Notes (Signed)
Report to PACU, RN, vss, BBS= Clear.  

## 2015-08-01 NOTE — Patient Instructions (Signed)
Impressions/recommendations:  Polyps (handout given) Hemorrhoids (handout given)  YOU HAD AN ENDOSCOPIC PROCEDURE TODAY AT THE [facility]:   Refer to the procedure report that was given to you for any specific questions about what was found during the examination.  If the procedure report does not answer your questions, please call your gastroenterologist to clarify.  If you requested that your care partner not be given the details of your procedure findings, then the procedure report has been included in a sealed envelope for you to review at your convenience later.  YOU SHOULD EXPECT: Some feelings of bloating in the abdomen. Passage of more gas than usual.  Walking can help get rid of the air that was put into your GI tract during the procedure and reduce the bloating. If you had a lower endoscopy (such as a colonoscopy or flexible sigmoidoscopy) you may notice spotting of blood in your stool or on the toilet paper. If you underwent a bowel prep for your procedure, you may not have a normal bowel movement for a few days.  Please Note:  You might notice some irritation and congestion in your nose or some drainage.  This is from the oxygen used during your procedure.  There is no need for concern and it should clear up in a day or so.  SYMPTOMS TO REPORT IMMEDIATELY:   Following lower endoscopy (colonoscopy or flexible sigmoidoscopy):  Excessive amounts of blood in the stool  Significant tenderness or worsening of abdominal pains  Swelling of the abdomen that is new, acute  Fever of 100F or higher   For urgent or emergent issues, a gastroenterologist can be reached at any hour by calling [phone].   DIET: Your first meal following the procedure should be a small meal and then it is ok to progress to your normal diet. Heavy or fried foods are harder to digest and may make you feel nauseous or bloated.  Likewise, meals heavy in dairy and vegetables can increase bloating.   Drink plenty of fluids but you should avoid alcoholic beverages for 24 hours.  ACTIVITY:  You should plan to take it easy for the rest of today and you should NOT DRIVE or use heavy machinery until tomorrow (because of the sedation medicines used during the test).    FOLLOW UP: Our staff will call the number listed on your records the next business day following your procedure to check on you and address any questions or concerns that you may have regarding the information given to you following your procedure. If we do not reach you, we will leave a message.  However, if you are feeling well and you are not experiencing any problems, there is no need to return our call.  We will assume that you have returned to your regular daily activities without incident.  If any biopsies were taken you will be contacted by phone or by letter within the next 1-3 weeks.  Please call us at [phone] if you have not heard about the biopsies in 3 weeks.    SIGNATURES/CONFIDENTIALITY: You and/or your care partner have signed paperwork which will be entered into your electronic medical record.  These signatures attest to the fact that that the information above on your After Visit Summary has been reviewed and is understood.  Full responsibility of the confidentiality of this discharge information lies with you and/or your care-partner. 

## 2015-08-01 NOTE — Progress Notes (Signed)
Called to room to assist during endoscopic procedure.  Patient ID and intended procedure confirmed with present staff. Received instructions for my participation in the procedure from the performing physician.  

## 2015-08-02 ENCOUNTER — Telehealth: Payer: Self-pay

## 2015-08-02 NOTE — Telephone Encounter (Signed)
  Follow up Call-  Call back number 08/01/2015  Post procedure Call Back phone  # [phone]  Permission to leave phone message Yes    Patient was called after his procedure on 08/01/2015. No answer at the number given for follow up phone call. A message was left on the answering machine.

## 2015-08-02 NOTE — Telephone Encounter (Signed)
  Follow up Call-  Call back number 08/01/2015  Post procedure Call Back phone  # [phone]  Permission to leave phone message Yes    Patient was called for follow up after his procedure on 08/01/2015. No answer at the number given for follow up phone call. A message was left on the answering machine.

## 2015-08-08 ENCOUNTER — Encounter: Payer: Self-pay | Admitting: Gastroenterology

## 2015-08-24 ENCOUNTER — Other Ambulatory Visit: Payer: Self-pay | Admitting: Family Medicine

## 2015-09-03 ENCOUNTER — Other Ambulatory Visit: Payer: Self-pay

## 2015-09-03 DIAGNOSIS — I10 Essential (primary) hypertension: Secondary | ICD-10-CM

## 2015-09-03 MED ORDER — AMLODIPINE BESYLATE 5 MG PO TABS: 5.0000 mg | ORAL_TABLET | Freq: Every day | ORAL | Status: DC

## 2015-09-03 MED ORDER — LOSARTAN POTASSIUM 100 MG PO TABS: 100.0000 mg | ORAL_TABLET | Freq: Every day | ORAL | Status: DC

## 2015-11-25 ENCOUNTER — Ambulatory Visit (INDEPENDENT_AMBULATORY_CARE_PROVIDER_SITE_OTHER): Payer: BLUE CROSS/BLUE SHIELD | Admitting: Family Medicine

## 2015-11-25 VITALS — BP 150/82 | HR 74 | Temp 98.8°F | Resp 18 | Ht 72.0 in | Wt 265.0 lb

## 2015-11-25 DIAGNOSIS — E119 Type 2 diabetes mellitus without complications: Secondary | ICD-10-CM | POA: Diagnosis not present

## 2015-11-25 DIAGNOSIS — M1009 Idiopathic gout, multiple sites: Secondary | ICD-10-CM | POA: Diagnosis not present

## 2015-11-25 DIAGNOSIS — E785 Hyperlipidemia, unspecified: Secondary | ICD-10-CM | POA: Diagnosis not present

## 2015-11-25 DIAGNOSIS — I10 Essential (primary) hypertension: Secondary | ICD-10-CM | POA: Diagnosis not present

## 2015-11-25 DIAGNOSIS — R42 Dizziness and giddiness: Secondary | ICD-10-CM | POA: Diagnosis not present

## 2015-11-25 DIAGNOSIS — M109 Gout, unspecified: Secondary | ICD-10-CM

## 2015-11-25 LAB — POCT CBC
GRANULOCYTE PERCENT: 64.8 % (ref 37–80)
HCT, POC: 41.3 % — AB (ref 43.5–53.7)
Hemoglobin: 14.6 g/dL (ref 14.1–18.1)
Lymph, poc: 1.5 (ref 0.6–3.4)
MCH, POC: 33.3 pg — AB (ref 27–31.2)
MCHC: 35.4 g/dL (ref 31.8–35.4)
MCV: 94 fL (ref 80–97)
MID (CBC): 0.4 (ref 0–0.9)
MPV: 6.6 fL (ref 0–99.8)
PLATELET COUNT, POC: 183 10*3/uL (ref 142–424)
POC Granulocyte: 3.6 (ref 2–6.9)
POC LYMPH %: 27.2 % (ref 10–50)
POC MID %: 8 %M (ref 0–12)
RBC: 4.4 M/uL — AB (ref 4.69–6.13)
RDW, POC: 13.1 %
WBC: 5.5 10*3/uL (ref 4.6–10.2)

## 2015-11-25 LAB — COMPLETE METABOLIC PANEL WITH GFR
ALT: 30 U/L (ref 9–46)
AST: 20 U/L (ref 10–35)
Albumin: 4.4 g/dL (ref 3.6–5.1)
Alkaline Phosphatase: 43 U/L (ref 40–115)
BILIRUBIN TOTAL: 0.6 mg/dL (ref 0.2–1.2)
BUN: 18 mg/dL (ref 7–25)
CO2: 25 mmol/L (ref 20–31)
Calcium: 9.2 mg/dL (ref 8.6–10.3)
Chloride: 105 mmol/L (ref 98–110)
Creat: 0.93 mg/dL (ref 0.70–1.25)
GFR, EST NON AFRICAN AMERICAN: 83 mL/min (ref >=60)
GFR, Est African American: >89 mL/min (ref >=60)
GLUCOSE: 106 mg/dL — AB (ref 65–99)
Potassium: 4.1 mmol/L (ref 3.5–5.3)
SODIUM: 140 mmol/L (ref 135–146)
TOTAL PROTEIN: 7.2 g/dL (ref 6.1–8.1)

## 2015-11-25 LAB — GLUCOSE, POCT (MANUAL RESULT ENTRY): POC GLUCOSE: 112 mg/dL — AB (ref 70–99)

## 2015-11-25 LAB — LIPID PANEL
Cholesterol: 111 mg/dL — ABNORMAL LOW (ref 125–200)
HDL: 47 mg/dL (ref >=40)
LDL CALC: 38 mg/dL (ref <130)
Total CHOL/HDL Ratio: 2.4 Ratio (ref <=5.0)
Triglycerides: 131 mg/dL (ref <150)
VLDL: 26 mg/dL (ref <30)

## 2015-11-25 LAB — URIC ACID: URIC ACID, SERUM: 6.6 mg/dL (ref 4.0–8.0)

## 2015-11-25 MED ORDER — METFORMIN HCL 500 MG PO TABS: 500.0000 mg | ORAL_TABLET | Freq: Every day | ORAL | 1 refills | Status: DC

## 2015-11-25 MED ORDER — ATORVASTATIN CALCIUM 10 MG PO TABS: 10.0000 mg | ORAL_TABLET | Freq: Every day | ORAL | 1 refills | Status: DC

## 2015-11-25 MED ORDER — LOSARTAN POTASSIUM 100 MG PO TABS: 100.0000 mg | ORAL_TABLET | Freq: Every day | ORAL | 1 refills | Status: DC

## 2015-11-25 MED ORDER — AMLODIPINE BESYLATE 5 MG PO TABS: 5.0000 mg | ORAL_TABLET | Freq: Every day | ORAL | 1 refills | Status: DC

## 2015-11-25 NOTE — Progress Notes (Signed)
Subjective:  By signing my name below, I, Raven Small, attest that this documentation has been prepared under the direction and in the presence of Merri Ray, MD.  Electronically Signed: Thea Alken, ED Scribe. 11/25/2015. 11:06 AM.   Patient ID: Jeffery George, male    DOB: November 04, 1945, 70 y.o.   MRN: [id number]  HPI Chief Complaint  Patient presents with  . Follow-up    6 MONTH DIABETES CHECK UP    HPI Comments: Jeffery George is a 70 y.o. male who presents to the [facility] 6 months diabetes follow up.   Lab Results  Component Value Date   HGBA1C 6.4 06/12/2015   Lab Results  Component Value Date   MICROALBUR 0.6 12/10/2014   Pt blood sugars range from 100-105. He had a spike last week at 197. He takes metformin 520m once a day. He also takes an aspirin.   Vision He last saw optho a couple months a for cataracts. He has another appointment in November. He reports decrease in vision.   Hyperlipidemia Lab Results  Component Value Date   CHOL 128 06/12/2015   HDL 41 06/12/2015   LDLCALC 54 06/12/2015   TRIG 166 (H) 06/12/2015   CHOLHDL 3.1 06/12/2015   Lab Results  Component Value Date   ALT 37 06/12/2015   AST 25 06/12/2015   ALKPHOS 44 06/12/2015   BILITOT 0.7 06/12/2015   He takes Lipitor  Hypertension Lab Results  Component Value Date   CREATININE 0.91 06/12/2015   He is on norvasc and losartan. He denies blood in stool dark tarry stools.  Gout Lab Results  Component Value Date   LABURIC 6.4 12/15/2014   He reports last flare in March 2017. He has colchicine at home.  Dizziness  He reports dizziness with standing and turning head, mostly first thing in the morning. States dizziness last for 15- 20 sec. He denies CP, palpitations.    Patient Active Problem List   Diagnosis Date Noted  . Gout 02/27/2013  . Essential hypertension, benign 02/27/2013  . Type II or unspecified type diabetes mellitus without mention of  complication, uncontrolled 02/27/2013   Past Medical History:  Diagnosis Date  . Arthritis   . Diabetes mellitus without complication ([place]   . Heart murmur    Past Surgical History:  Procedure Laterality Date  . FRACTURE SURGERY     Lt ankle  . HERNIA REPAIR    . VASECTOMY     No Known Allergies Prior to Admission medications   Medication Sig Start Date End Date Taking? Authorizing Provider  ACCU-CHEK SOFTCLIX LANCETS lancets Test blood sugar once daily. Dx E11.9 08/26/15  Yes JWendie Agreste MD  amLODipine (NORVASC) 5 MG tablet Take 1 tablet (5 mg total) by mouth daily. 09/03/15  Yes JWendie Agreste MD  aspirin EC 81 MG tablet Take 1 tablet (81 mg total) by mouth daily. 02/27/13  Yes EShawnee Knapp MD  atorvastatin (LIPITOR) 10 MG tablet Take 1 tablet (10 mg total) by mouth daily. 06/12/15  Yes JWendie Agreste MD  Blood Glucose Monitoring Suppl (BLOOD GLUCOSE MONITOR KIT) KIT Use to test blood sugar twice daily. 07/17/13  Yes JWendie Agreste MD  colchicine (COLCRYS) 0.6 MG tablet Take 2 tabs po at symptom onset. Repeat 1 tab po 1 hour later. 07/22/15  Yes NEzekiel Slocumb PA-C  glucose blood (ONE TOUCH ULTRA TEST) test strip Use to test blood sugar once daily. Dx:  E11.9 05/31/15  Yes [name], MD  indomethacin (INDOCIN) 50 MG capsule TAKE 1 CAPSULE THREE TIMES A DAY AS NEEDED FOR MODERATE PAIN 07/11/15  Yes [name], MD  Lancets [facility] ULTRASOFT) lancets Use as instructed 06/05/13  Yes [name], MD  losartan (COZAAR) 100 MG tablet Take 1 tablet (100 mg total) by mouth daily. 09/03/15  Yes [name], MD  metFORMIN (GLUCOPHAGE) 500 MG tablet Take 1 tablet (500 mg total) by mouth daily with breakfast. 06/12/15  Yes [name], MD  Omega-3 Fatty Acids (FISH OIL) 1000 MG CAPS Take by mouth.   Yes Historical Provider, MD   Social History   Social History  . Marital status: Married    Spouse name: N/A  . Number of children: N/A  . Years of education: N/A     Occupational History  . Warehouse Dispatcher    Social History Main Topics  . Smoking status: Former Research scientist (life sciences)  . Smokeless tobacco: Never Used  . Alcohol use No  . Drug use: No  . Sexual activity: Not on file   Other Topics Concern  . Not on file   Social History Narrative   Married   Counsellor at [employer]  Constitutional: Negative for fatigue and unexpected weight change.  Eyes: Positive for visual disturbance.  Respiratory: Negative for cough, chest tightness and shortness of breath.   Cardiovascular: Negative for chest pain, palpitations and leg swelling.  Gastrointestinal: Negative for abdominal pain and blood in stool.  Neurological: Positive for dizziness. Negative for light-headedness and headaches.       Objective:   Physical Exam  Constitutional: He is oriented to person, place, and time. He appears well-developed and well-nourished.  HENT:  Head: Normocephalic and atraumatic.  Eyes: Conjunctivae and EOM are normal. Pupils are equal, round, and reactive to light. Right eye exhibits no nystagmus. Left eye exhibits no nystagmus.  Neck: No JVD present. Carotid bruit is not present.  Cardiovascular: Normal rate, regular rhythm and normal heart sounds.   No murmur heard. Pulmonary/Chest: Effort normal and breath sounds normal. He has no rales.  Musculoskeletal: He exhibits no edema.  Neurological: He is alert and oriented to person, place, and time. He displays a negative Romberg sign.  No pronator drift.   Skin: Skin is warm and dry.  Psychiatric: He has a normal mood and affect.  Vitals reviewed.   Vitals:   11/25/15 1100  BP: (!) 150/82  Pulse: 74  Resp: 18  Temp: 98.8 F (37.1 C)  TempSrc: Oral  SpO2: 96%  Weight: 265 lb (120.2 kg)  Height: 6' (1.829 m)   Assessment & Plan:   Jeffery George is a 70 y.o. male Type 2 diabetes mellitus without complication, without long-term current use of insulin ([place]) - Plan:  Hemoglobin A1C, POCT glucose (manual entry)  - Check A1c, continue metformin same dose for now.   - Plan on urine microalbumin next visit, and continue follow-up with ophthalmic provider.   Dizziness - Plan: POCT glucose (manual entry), EKG 12-Lead, Orthostatic vital signs, POCT CBC  - Orthostatic hypotension versus relative volume depletion. Minimal change in orthostatics in office. Advised increase fluid intake, remaining on same dose of medications for now, sit on edge of bed first thing in the morning, and get up slowly. If symptoms persist, return for recheck, sooner if worse.   Gouty arthritis - Plan: Uric Acid  - No recent flares.  Will check uric acid, but if recurrent flares, consider daily allopurinol or daily colchicine.  Essential hypertension  - Overall stable. As above, if persistent dizziness symptoms, return for recheck and discuss other options.  Hyperlipidemia - Plan: COMPLETE METABOLIC PANEL WITH GFR, Lipid panel  - Labs pending. Continue Lipitor same dose for now.  No orders of the defined types were placed in this encounter.  Patient Instructions       IF you received an x-ray today, you will receive an invoice from [facility]. Please contact [facility] at [phone] with questions or concerns regarding your invoice.   IF you received labwork today, you will receive an invoice from [employer]. Please contact [facility] at [phone] with questions or concerns regarding your invoice.   Our billing staff will not be able to assist you with questions regarding bills from these companies.  You will be contacted with the lab results as soon as they are available. The fastest way to get your results is to activate your My Chart account. Instructions are located on the last page of this paperwork. If you have not heard from [place] regarding the results in 2 weeks, please contact this office.    Drink plenty of fluids to make  sure you do not get dehydrated. Sit on edge of bed first thing in morning and get up slowly. I will check other bloodwork for dizziness, but if these symptoms do not improve in 1 week - return for recheck.  Return to the clinic or go to the nearest emergency room if any of your symptoms worsen or new symptoms occur.  If gout flares return - return for recheck to look into possible daily medicine.    Dizziness Dizziness is a common problem. It is a feeling of unsteadiness or light-headedness. You may feel like you are about to faint. Dizziness can lead to injury if you stumble or fall. Anyone can become dizzy, but dizziness is more common in older adults. This condition can be caused by a number of things, including medicines, dehydration, or illness. HOME CARE INSTRUCTIONS Taking these steps may help with your condition: Eating and Drinking  Drink enough fluid to keep your urine clear or pale yellow. This helps to keep you from becoming dehydrated. Try to drink more clear fluids, such as water.  Do not drink alcohol.  Limit your caffeine intake if directed by your health care provider.  Limit your salt intake if directed by your health care provider. Activity  Avoid making quick movements.  Rise slowly from chairs and steady yourself until you feel okay.  In the morning, first sit up on the side of the bed. When you feel okay, stand slowly while you hold onto something until you know that your balance is fine.  Move your legs often if you need to stand in one place for a long time. Tighten and relax your muscles in your legs while you are standing.  Do not drive or operate heavy machinery if you feel dizzy.  Avoid bending down if you feel dizzy. Place items in your home so that they are easy for you to reach without leaning over. Lifestyle  Do not use any tobacco products, including cigarettes, chewing tobacco, or electronic cigarettes. If you need help quitting, ask your health  care provider.  Try to reduce your stress level, such as with yoga or meditation. Talk with your health care provider if you need help. General Instructions  Watch your dizziness  for any changes.  Take medicines only as directed by your health care provider. Talk with your health care provider if you think that your dizziness is caused by a medicine that you are taking.  Tell a friend or a family member that you are feeling dizzy. If he or she notices any changes in your behavior, have this person call your health care provider.  Keep all follow-up visits as directed by your health care provider. This is important. SEEK MEDICAL CARE IF:  Your dizziness does not go away.  Your dizziness or light-headedness gets worse.  You feel nauseous.  You have reduced hearing.  You have new symptoms.  You are unsteady on your feet or you feel like the room is spinning. SEEK IMMEDIATE MEDICAL CARE IF:  You vomit or have diarrhea and are unable to eat or drink anything.  You have problems talking, walking, swallowing, or using your arms, hands, or legs.  You feel generally weak.  You are not thinking clearly or you have trouble forming sentences. It may take a friend or family member to notice this.  You have chest pain, abdominal pain, shortness of breath, or sweating.  Your vision changes.  You notice any bleeding.  You have a headache.  You have neck pain or a stiff neck.  You have a fever.   This information is not intended to replace advice given to you by your health care provider. Make sure you discuss any questions you have with your health care provider.   Document Released: 09/23/2000 Document Revised: 08/14/2014 Document Reviewed: 03/26/2014 Elsevier Interactive Patient Education [employer].    I personally performed the services described in this documentation, which was scribed in my presence. The recorded information has been reviewed and considered, and  addended by me as needed.   Signed,   Merri Ray, MD [facility].  11/25/15 1:40 PM

## 2015-11-25 NOTE — Patient Instructions (Addendum)
IF you received an x-ray today, you will receive an invoice from [facility] Radiology. Please contact [facility] Radiology at [phone] with questions or concerns regarding your invoice.   IF you received labwork today, you will receive an invoice from [employer]. Please contact [facility] at [phone] with questions or concerns regarding your invoice.   Our billing staff will not be able to assist you with questions regarding bills from these companies.  You will be contacted with the lab results as soon as they are available. The fastest way to get your results is to activate your My Chart account. Instructions are located on the last page of this paperwork. If you have not heard from [place] regarding the results in 2 weeks, please contact this office.    Drink plenty of fluids to make sure you do not get dehydrated. Sit on edge of bed first thing in morning and get up slowly. I will check other bloodwork for dizziness, but if these symptoms do not improve in 1 week - return for recheck.  Return to the clinic or go to the nearest emergency room if any of your symptoms worsen or new symptoms occur.  If gout flares return - return for recheck to look into possible daily medicine.    Dizziness Dizziness is a common problem. It is a feeling of unsteadiness or light-headedness. You may feel like you are about to faint. Dizziness can lead to injury if you stumble or fall. Anyone can become dizzy, but dizziness is more common in older adults. This condition can be caused by a number of things, including medicines, dehydration, or illness. HOME CARE INSTRUCTIONS Taking these steps may help with your condition: Eating and Drinking  Drink enough fluid to keep your urine clear or pale yellow. This helps to keep you from becoming dehydrated. Try to drink more clear fluids, such as water.  Do not drink alcohol.  Limit your caffeine intake if directed by your health  care provider.  Limit your salt intake if directed by your health care provider. Activity  Avoid making quick movements.  Rise slowly from chairs and steady yourself until you feel okay.  In the morning, first sit up on the side of the bed. When you feel okay, stand slowly while you hold onto something until you know that your balance is fine.  Move your legs often if you need to stand in one place for a long time. Tighten and relax your muscles in your legs while you are standing.  Do not drive or operate heavy machinery if you feel dizzy.  Avoid bending down if you feel dizzy. Place items in your home so that they are easy for you to reach without leaning over. Lifestyle  Do not use any tobacco products, including cigarettes, chewing tobacco, or electronic cigarettes. If you need help quitting, ask your health care provider.  Try to reduce your stress level, such as with yoga or meditation. Talk with your health care provider if you need help. General Instructions  Watch your dizziness for any changes.  Take medicines only as directed by your health care provider. Talk with your health care provider if you think that your dizziness is caused by a medicine that you are taking.  Tell a friend or a family member that you are feeling dizzy. If he or she notices any changes in your behavior, have this person call your health care provider.  Keep all follow-up visits as directed by  your health care provider. This is important. SEEK MEDICAL CARE IF:  Your dizziness does not go away.  Your dizziness or light-headedness gets worse.  You feel nauseous.  You have reduced hearing.  You have new symptoms.  You are unsteady on your feet or you feel like the room is spinning. SEEK IMMEDIATE MEDICAL CARE IF:  You vomit or have diarrhea and are unable to eat or drink anything.  You have problems talking, walking, swallowing, or using your arms, hands, or legs.  You feel generally  weak.  You are not thinking clearly or you have trouble forming sentences. It may take a friend or family member to notice this.  You have chest pain, abdominal pain, shortness of breath, or sweating.  Your vision changes.  You notice any bleeding.  You have a headache.  You have neck pain or a stiff neck.  You have a fever.   This information is not intended to replace advice given to you by your health care provider. Make sure you discuss any questions you have with your health care provider.   Document Released: 09/23/2000 Document Revised: 08/14/2014 Document Reviewed: 03/26/2014 Elsevier Interactive Patient Education [employer].

## 2015-11-26 LAB — HEMOGLOBIN A1C
Hgb A1c MFr Bld: 6.3 % — ABNORMAL HIGH (ref <5.7)
Mean Plasma Glucose: 134 mg/dL

## 2015-11-30 ENCOUNTER — Other Ambulatory Visit: Payer: Self-pay | Admitting: Family Medicine

## 2016-02-03 ENCOUNTER — Ambulatory Visit (INDEPENDENT_AMBULATORY_CARE_PROVIDER_SITE_OTHER): Payer: BLUE CROSS/BLUE SHIELD | Admitting: Urgent Care

## 2016-02-03 DIAGNOSIS — Z23 Encounter for immunization: Secondary | ICD-10-CM

## 2016-03-02 ENCOUNTER — Other Ambulatory Visit: Payer: Self-pay | Admitting: Family Medicine

## 2016-06-15 ENCOUNTER — Other Ambulatory Visit: Payer: Self-pay | Admitting: Family Medicine

## 2016-06-15 DIAGNOSIS — E785 Hyperlipidemia, unspecified: Secondary | ICD-10-CM

## 2016-06-15 NOTE — Telephone Encounter (Signed)
Please call and schedule patient and appt within 1 month with Carlota Raspberry

## 2016-07-22 ENCOUNTER — Other Ambulatory Visit: Payer: Self-pay | Admitting: Family Medicine

## 2016-07-22 DIAGNOSIS — I10 Essential (primary) hypertension: Secondary | ICD-10-CM

## 2016-08-20 ENCOUNTER — Ambulatory Visit (INDEPENDENT_AMBULATORY_CARE_PROVIDER_SITE_OTHER): Payer: BLUE CROSS/BLUE SHIELD | Admitting: Family Medicine

## 2016-08-20 ENCOUNTER — Encounter: Payer: Self-pay | Admitting: Family Medicine

## 2016-08-20 VITALS — BP 142/72 | HR 76 | Temp 98.9°F | Resp 16 | Ht 72.5 in | Wt 261.2 lb

## 2016-08-20 DIAGNOSIS — E119 Type 2 diabetes mellitus without complications: Secondary | ICD-10-CM

## 2016-08-20 DIAGNOSIS — Z0000 Encounter for general adult medical examination without abnormal findings: Secondary | ICD-10-CM

## 2016-08-20 DIAGNOSIS — Z125 Encounter for screening for malignant neoplasm of prostate: Secondary | ICD-10-CM

## 2016-08-20 DIAGNOSIS — Z794 Long term (current) use of insulin: Secondary | ICD-10-CM | POA: Diagnosis not present

## 2016-08-20 DIAGNOSIS — E785 Hyperlipidemia, unspecified: Secondary | ICD-10-CM

## 2016-08-20 DIAGNOSIS — I10 Essential (primary) hypertension: Secondary | ICD-10-CM | POA: Diagnosis not present

## 2016-08-20 NOTE — Progress Notes (Signed)
   Subjective:    Patient ID: Jeffery George, male    DOB: [birth year], 71 y.o.   MRN: [id number]  HPI    Review of Systems  Constitutional: Negative.   HENT: Negative.   Eyes: Positive for visual disturbance.  Respiratory: Negative.   Cardiovascular: Negative.   Gastrointestinal: Negative.   Endocrine: Positive for polyuria.  Genitourinary: Negative.   Musculoskeletal: Negative.   Skin: Negative.   Allergic/Immunologic: Negative.   Neurological: Negative.   Hematological: Negative.   Psychiatric/Behavioral: Negative.        Objective:   Physical Exam        Assessment & Plan:

## 2016-08-20 NOTE — Patient Instructions (Addendum)
Follow up with eye care provider as vision worse today than last visit.  I will hold off on refilling her metformin until I see the A1c. No change in other medications at this time. Follow-up in 6 months.  Keeping you healthy  Get these tests  Blood pressure- Have your blood pressure checked once a year by your healthcare provider.  Normal blood pressure is 120/80  Weight- Have your body mass index (BMI) calculated to screen for obesity.  BMI is a measure of body fat based on height and weight. You can also calculate your own BMI at ViewBanking.[email or web address].  Cholesterol- Have your cholesterol checked every year.  Diabetes- Have your blood sugar checked regularly if you have high blood pressure, high cholesterol, have a family history of diabetes or if you are overweight.  Screening for Colon Cancer- Colonoscopy starting at age 49.  Screening may begin sooner depending on your family history and other health conditions. Follow up colonoscopy as directed by your Gastroenterologist.  Screening for Prostate Cancer- Both blood work (PSA) and a rectal exam help screen for Prostate Cancer.  Screening begins at age 53 with African-American men and at age 67 with Caucasian men.  Screening may begin sooner depending on your family history.  Take these medicines  Aspirin- One aspirin daily can help prevent Heart disease and Stroke.  Flu shot- Every fall.  Tetanus- Every 10 years.  Zostavax- Once after the age of 43 to prevent Shingles.  Pneumonia shot- Once after the age of 53; if you are younger than 53, ask your healthcare provider if you need a Pneumonia shot.  Take these steps  Don't smoke- If you do smoke, talk to your doctor about quitting.  For tips on how to quit, go to www.smokefree.gov or call 1-800-QUIT-NOW.  Be physically active- Exercise 5 days a week for at least 30 minutes.  If you are not already physically active start slow and gradually work up to 30 minutes of moderate  physical activity.  Examples of moderate activity include walking briskly, mowing the yard, dancing, swimming, bicycling, etc.  Eat a healthy diet- Eat a variety of healthy food such as fruits, vegetables, low fat milk, low fat cheese, yogurt, lean meant, poultry, fish, beans, tofu, etc. For more information go to www.thenutritionsource.org  Drink alcohol in moderation- Limit alcohol intake to less than two drinks a day. Never drink and drive.  Dentist- Brush and floss twice daily; visit your dentist twice a year.  Depression- Your emotional health is as important as your physical health. If you're feeling down, or losing interest in things you would normally enjoy please talk to your healthcare provider.  Eye exam- Visit your eye doctor every year.  Safe sex- If you may be exposed to a sexually transmitted infection, use a condom.  Seat belts- Seat belts can save your life; always wear one.  Smoke/Carbon Monoxide detectors- These detectors need to be installed on the appropriate level of your home.  Replace batteries at least once a year.  Skin cancer- When out in the sun, cover up and use sunscreen 15 SPF or higher.  Violence- If anyone is threatening you, please tell your healthcare provider.  Living Will/ Health care power of attorney- Speak with your healthcare provider and family.   IF you received an x-ray today, you will receive an invoice from [facility] Radiology. Please contact [facility] Radiology at [phone] with questions or concerns regarding your invoice.   IF you received labwork today, you will receive  an Pharmacologist from [employer]. Please contact [place] at [phone] with questions or concerns regarding your invoice.   Our billing staff will not be able to assist you with questions regarding bills from these companies.  You will be contacted with the lab results as soon as they are available. The fastest way to get your results is to activate your My Chart  account. Instructions are located on the last page of this paperwork. If you have not heard from [place] regarding the results in 2 weeks, please contact this office.

## 2016-08-20 NOTE — Progress Notes (Signed)
Subjective:  By signing my name below, I, Moises Blood, attest that this documentation has been prepared under the direction and in the presence of Merri Ray, MD. Electronically Signed: Moises Blood, [place]. 08/20/2016 , 3:41 PM .  Patient was seen in Room 27 .   Patient ID: Jeffery George, male    DOB: June 30, 1945, 71 y.o.   MRN: [id number] Chief Complaint  Patient presents with  . Annual Exam   HPI Jeffery George is a 71 y.o. male Here for annual physical. He has a history of DM, gout and HTN. He had fasting lab work done this morning. His GI mentioned treatment of hemorrhoids, but patient is deciding to have this done as they're not bothering him much currently.   DM Lab Results  Component Value Date   HGBA1C 6.3 (H) 11/25/2015   Lab Results  Component Value Date   MICROALBUR 0.6 12/10/2014   He was controlled previously on metformin 540m QD.   He states he hasn't been taking his medication for a few months. He's been checking his blood sugar lately, running in the range of 105-116.   Wt Readings from Last 3 Encounters:  08/20/16 261 lb 3.2 oz (118.5 kg)  11/25/15 265 lb (120.2 kg)  08/01/15 261 lb (118.4 kg)    HTN He takes norvasc 516mQD and losartan 10081mD.  Tolerating without any new side effects Lab Results  Component Value Date   CREATININE 0.93 11/25/2015    HLD Lab Results  Component Value Date   CHOL 111 (L) 11/25/2015   HDL 47 11/25/2015   LDLCALC 38 11/25/2015   TRIG 131 11/25/2015   CHOLHDL 2.4 11/25/2015   Lab Results  Component Value Date   ALT 30 11/25/2015   AST 20 11/25/2015   ALKPHOS 43 11/25/2015   BILITOT 0.6 11/25/2015   He takes lipitor 47m9m Without new side effects.  Gout Lab Results  Component Value Date   LABURIC 6.6 11/25/2015   He has taken indomethacin or colchicine in the past. His last flare up was over a year ago. He denies taking daily medications.   Cancer Screening Colonoscopy: April 2017, did have  4 polyps removed, hyperplastic polyps, repeat 10 years.  Prostate cancer screening:  Lab Results  Component Value Date   PSA 0.87 06/12/2015   PSA 0.86 05/01/2013   He denies any new moles or skin changes.   Immunizations Immunization History  Administered Date(s) Administered  . Influenza,inj,Quad PF,36+ Mos 02/27/2013, 06/11/2014, 12/10/2014, 02/03/2016  . Pneumococcal Conjugate-13 06/12/2015  . Pneumococcal Polysaccharide-23 05/01/2013  . Tdap 05/01/2013  . Zoster 05/09/2013   Hep C screening: done in Mar 2017.   Falls screening: denies falls in past year.   Depression Depression screen PHQ [facility] 08/20/2016 11/25/2015 07/22/2015 06/12/2015 12/15/2014  Decreased Interest 0 0 0 0 0  Down, Depressed, Hopeless 0 0 0 0 0  PHQ - 2 Score 0 0 0 0 0     Vision  Visual Acuity Screening   Right eye Left eye Both eyes  Without correction:     With correction: _0   He states he does have cataracts in both. During his last ophthalmologist visit, he was going to have them operated in January, but showed early symptoms of glaucoma. His ophthalmologist informs that if his pressure increases at his visit in a month, patient will start on glaucoma treatment. Patient reports having terrible glares from headlights and taillights at night.  Dentist He sees dentist every 6 months.   Exercise He works in the yard and still works full time job. 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 He denies any shortness of breath.   Functional Status Survey: Is the patient deaf or have difficulty hearing?: No Does the patient have difficulty seeing, even when wearing glasses/contacts?: Yes (pt states cataracts) Does the patient have difficulty concentrating, remembering, or making decisions?: No Does the patient have difficulty walking or climbing stairs?: Yes (due to ankle injury some years ago) Does the patient have difficulty dressing or  bathing?: No Does the patient have difficulty doing errands alone such as visiting a doctor's office or shopping?: No  Advanced Directives He does not have advanced directives at this time. He received paperwork for it today.   Patient Active Problem List   Diagnosis Date Noted  . Gout 02/27/2013  . Essential hypertension, benign 02/27/2013  . Type II or unspecified type diabetes mellitus without mention of complication, uncontrolled 02/27/2013   Past Medical History:  Diagnosis Date  . Arthritis   . Diabetes mellitus without complication ([place])   . Heart murmur    Past Surgical History:  Procedure Laterality Date  . FRACTURE SURGERY     Lt ankle  . [place]     Not on File Prior to Admission medications   Medication Sig Start Date End Date Taking? Authorizing Provider  ACCU-CHEK SOFTCLIX LANCETS lancets Test blood sugar once daily. Dx E11.9 08/26/15  Yes [name], MD  amLODipine (NORVASC) 5 MG tablet TAKE 1 TABLET DAILY 07/22/16  Yes [name], MD  aspirin EC 81 MG tablet Take 1 tablet (81 mg total) by mouth daily. 02/27/13  Yes Shawnee Knapp, MD  atorvastatin (LIPITOR) 10 MG tablet TAKE 1 TABLET DAILY 06/15/16  Yes Weber, Damaris Hippo, PA-C  Blood Glucose Monitoring Suppl (BLOOD GLUCOSE MONITOR KIT) KIT Use to test blood sugar twice daily. 07/17/13  Yes [name], MD  colchicine (COLCRYS) 0.6 MG tablet Take 2 tabs po at symptom onset. Repeat 1 tab po 1 hour later. 07/22/15  Yes Ezekiel Slocumb, PA-C  indomethacin (INDOCIN) 50 MG capsule TAKE 1 CAPSULE THREE TIMES A DAY AS NEEDED FOR MODERATE PAIN 07/11/15  Yes [name], MD  losartan (COZAAR) 100 MG tablet TAKE 1 TABLET DAILY 07/22/16  Yes [name], MD  metFORMIN (GLUCOPHAGE) 500 MG tablet Take 1 tablet (500 mg total) by mouth daily with breakfast. 11/25/15  Yes [name], MD  Omega-3 Fatty Acids (FISH OIL) 1000 MG CAPS Take by mouth.   Yes [provider]  ONE TOUCH  ULTRA TEST test strip USE TO TEST BLOOD SUGAR ONCE DAILY 03/02/16  Yes Shawnee Knapp, MD  Lancets [facility] ULTRASOFT) lancets Use as instructed Patient not taking: Reported on 08/20/2016 06/05/13   [name], MD   Social History   Social History  . Marital status: Married    Spouse name: N/A  . Number of children: N/A  . Years of education: N/A   Occupational History  . Warehouse Dispatcher    Social History Main Topics  . Smoking status: Former Research scientist (life sciences)  . Smokeless tobacco: Never Used  . Alcohol use No  . Drug use: No  . Sexual activity: Not on file   Other Topics Concern  . Not on file   Social History Narrative  Married   Counsellor at [employer]   Review of Systems  Eyes: Positive for visual disturbance.  Genitourinary: Positive for frequency.  All other systems reviewed and are negative.      Objective:   Physical Exam  Constitutional: He is oriented to person, place, and time. He appears well-developed and well-nourished.  HENT:  Head: Normocephalic and atraumatic.  Right Ear: External ear normal.  Left Ear: External ear normal.  Mouth/Throat: Oropharynx is clear and moist.  Eyes: Conjunctivae and EOM are normal. Pupils are equal, round, and reactive to light.  Neck: Normal range of motion. Neck supple. No thyromegaly present.  Cardiovascular: Normal rate, regular rhythm, normal heart sounds and intact distal pulses.   Pulmonary/Chest: Effort normal and breath sounds normal. No respiratory distress. He has no wheezes.  Abdominal: Soft. He exhibits no distension. There is no tenderness. Hernia confirmed negative in the right inguinal area and confirmed negative in the left inguinal area.  Genitourinary: Prostate normal.  Musculoskeletal: Normal range of motion. He exhibits no edema or tenderness.  Soft tissue swelling at left ankle with well healed scars  Lymphadenopathy:    He has no cervical adenopathy.  Neurological: He is alert and  oriented to person, place, and time. He has normal reflexes.  Skin: Skin is warm and dry.  Psychiatric: He has a normal mood and affect. His behavior is normal.  Vitals reviewed.    Vitals:   08/20/16 1431 08/20/16 1437 08/20/16 1510  BP: (!) 172/74 (!) 150/64 (!) 142/72  Pulse: 76    Resp: 16    Temp: 98.9 F (37.2 C)    TempSrc: Oral    SpO2: 97%    Weight: 261 lb 3.2 oz (118.5 kg)    Height: 6' 0.5" (1.842 m)         Assessment & Plan:  Jeffery George is a 71 y.o. male Medicare annual wellness visit, subsequent  -  - anticipatory guidance as below in AVS, screening labs if needed. Health maintenance items as above in HPI discussed/recommended as applicable.   - no concerning responses on depression, fall, or functional status screening. Any positive responses noted as above. Advanced directives discussed as in CHL.   Type 2 diabetes mellitus without complication, with long-term current use of insulin ([place]) - Plan: HM Diabetes Foot Exam, Hemoglobin A1c  - Check A1c as home readings okay off of medication.  Essential hypertension  - Borderline, on higher level. If remains over 140/90 on home readings, consider change of medication.  Hyperlipidemia, unspecified hyperlipidemia type - Plan: Comprehensive metabolic panel, Lipid panel  -Tolerating Lipitor, no med changes, labs pending  Screening for prostate cancer - Plan: PSA  - We discussed pros and cons of prostate cancer screening, and after this discussion, he chose to have screening done. PSA obtained, and no concerning findings on DRE.    No orders of the defined types were placed in this encounter.  Patient Instructions    Follow up with eye care provider as vision worse today than last visit.  I will hold off on refilling her metformin until I see the A1c. No change in other medications at this time. Follow-up in 6 months.  Keeping you healthy  Get these tests  Blood pressure- Have your blood pressure checked  once a year by your healthcare provider.  Normal blood pressure is 120/80  Weight- Have your body mass index (BMI) calculated to screen for obesity.  BMI is a measure  of body fat based on height and weight. You can also calculate your own BMI at ViewBanking.si.  Cholesterol- Have your cholesterol checked every year.  Diabetes- Have your blood sugar checked regularly if you have high blood pressure, high cholesterol, have a family history of diabetes or if you are overweight.  Screening for Colon Cancer- Colonoscopy starting at age 35.  Screening may begin sooner depending on your family history and other health conditions. Follow up colonoscopy as directed by your Gastroenterologist.  Screening for Prostate Cancer- Both blood work (PSA) and a rectal exam help screen for Prostate Cancer.  Screening begins at age 52 with African-American men and at age 23 with Caucasian men.  Screening may begin sooner depending on your family history.  Take these medicines  Aspirin- One aspirin daily can help prevent Heart disease and Stroke.  Flu shot- Every fall.  Tetanus- Every 10 years.  Zostavax- Once after the age of 50 to prevent Shingles.  Pneumonia shot- Once after the age of 75; if you are younger than 74, ask your healthcare provider if you need a Pneumonia shot.  Take these steps  Don't smoke- If you do smoke, talk to your doctor about quitting.  For tips on how to quit, go to www.smokefree.gov or call 1-800-QUIT-NOW.  Be physically active- Exercise 5 days a week for at least 30 minutes.  If you are not already physically active start slow and gradually work up to 30 minutes of moderate physical activity.  Examples of moderate activity include walking briskly, mowing the yard, dancing, swimming, bicycling, etc.  Eat a healthy diet- Eat a variety of healthy food such as fruits, vegetables, low fat milk, low fat cheese, yogurt, lean meant, poultry, fish, beans, tofu, etc. For more  information go to www.thenutritionsource.org  Drink alcohol in moderation- Limit alcohol intake to less than two drinks a day. Never drink and drive.  Dentist- Brush and floss twice daily; visit your dentist twice a year.  Depression- Your emotional health is as important as your physical health. If you're feeling down, or losing interest in things you would normally enjoy please talk to your healthcare provider.  Eye exam- Visit your eye doctor every year.  Safe sex- If you may be exposed to a sexually transmitted infection, use a condom.  Seat belts- Seat belts can save your life; always wear one.  Smoke/Carbon Monoxide detectors- These detectors need to be installed on the appropriate level of your home.  Replace batteries at least once a year.  Skin cancer- When out in the sun, cover up and use sunscreen 15 SPF or higher.  Violence- If anyone is threatening you, please tell your healthcare provider.  Living Will/ Health care power of attorney- Speak with your healthcare provider and family.   IF you received an x-ray today, you will receive an invoice from [facility] Radiology. Please contact [facility] Radiology at [phone] with questions or concerns regarding your invoice.   IF you received labwork today, you will receive an invoice from [facility]. Please contact [facility] at [phone] with questions or concerns regarding your invoice.   Our billing staff will not be able to assist you with questions regarding bills from these companies.  You will be contacted with the lab results as soon as they are available. The fastest way to get your results is to activate your My Chart account. Instructions are located on the last page of this paperwork. If you have not heard from [place] regarding the results in 2  weeks, please contact this office.      I personally performed the services described in this documentation, which was scribed in my presence. The recorded information has  been reviewed and considered for accuracy and completeness, addended by me as needed, and agree with information above.  Signed,   Merri Ray, MD [facility] [place].  08/22/16 4:54 PM

## 2016-08-21 LAB — COMPREHENSIVE METABOLIC PANEL
ALT: 29 IU/L (ref 0–44)
AST: 24 IU/L (ref 0–40)
Albumin/Globulin Ratio: 1.8 (ref 1.2–2.2)
Albumin: 4.6 g/dL (ref 3.6–4.8)
Alkaline Phosphatase: 46 IU/L (ref 39–117)
BUN/Creatinine Ratio: 29 — ABNORMAL HIGH (ref 10–24)
BUN: 23 mg/dL (ref 8–27)
Bilirubin Total: 0.5 mg/dL (ref 0.0–1.2)
CALCIUM: 9.3 mg/dL (ref 8.6–10.2)
CO2: 24 mmol/L (ref 18–29)
CREATININE: 0.79 mg/dL (ref 0.76–1.27)
Chloride: 103 mmol/L (ref 96–106)
GFR, EST AFRICAN AMERICAN: 106 mL/min/{1.73_m2} (ref >59)
GFR, EST NON AFRICAN AMERICAN: 92 mL/min/{1.73_m2} (ref >59)
Globulin, Total: 2.6 g/dL (ref 1.5–4.5)
Glucose: 127 mg/dL — ABNORMAL HIGH (ref 65–99)
POTASSIUM: 4.4 mmol/L (ref 3.5–5.2)
Sodium: 142 mmol/L (ref 134–144)
Total Protein: 7.2 g/dL (ref 6.0–8.5)

## 2016-08-21 LAB — LIPID PANEL
CHOL/HDL RATIO: 2.7 ratio (ref 0.0–5.0)
Cholesterol, Total: 114 mg/dL (ref 100–199)
HDL: 43 mg/dL (ref >39)
LDL CALC: 53 mg/dL (ref 0–99)
TRIGLYCERIDES: 88 mg/dL (ref 0–149)
VLDL Cholesterol Cal: 18 mg/dL (ref 5–40)

## 2016-08-21 LAB — HEMOGLOBIN A1C
Est. average glucose Bld gHb Est-mCnc: 137 mg/dL
HEMOGLOBIN A1C: 6.4 % — AB (ref 4.8–5.6)

## 2016-08-21 LAB — PSA: PROSTATE SPECIFIC AG, SERUM: 1.1 ng/mL (ref 0.0–4.0)

## 2016-10-20 ENCOUNTER — Other Ambulatory Visit: Payer: Self-pay | Admitting: Family Medicine

## 2016-10-20 DIAGNOSIS — I10 Essential (primary) hypertension: Secondary | ICD-10-CM

## 2016-10-21 ENCOUNTER — Other Ambulatory Visit: Payer: Self-pay | Admitting: Family Medicine

## 2016-10-21 DIAGNOSIS — I10 Essential (primary) hypertension: Secondary | ICD-10-CM

## 2016-12-12 ENCOUNTER — Other Ambulatory Visit: Payer: Self-pay | Admitting: Physician Assistant

## 2016-12-12 DIAGNOSIS — E785 Hyperlipidemia, unspecified: Secondary | ICD-10-CM

## 2017-01-18 ENCOUNTER — Other Ambulatory Visit: Payer: Self-pay | Admitting: Family Medicine

## 2017-01-18 DIAGNOSIS — I10 Essential (primary) hypertension: Secondary | ICD-10-CM

## 2017-01-19 ENCOUNTER — Telehealth: Payer: Self-pay | Admitting: Family Medicine

## 2017-01-19 NOTE — Telephone Encounter (Signed)
Pt walked in to request a refill of his metformin.  I did advise pt that he might need to see Carlota Raspberry before he will call it in.  Pt uses the [facility] mail in.  Please advise [phone]

## 2017-01-24 ENCOUNTER — Other Ambulatory Visit: Payer: Self-pay | Admitting: Family Medicine

## 2017-01-24 DIAGNOSIS — E119 Type 2 diabetes mellitus without complications: Secondary | ICD-10-CM

## 2017-01-26 ENCOUNTER — Other Ambulatory Visit: Payer: Self-pay

## 2017-01-26 DIAGNOSIS — E119 Type 2 diabetes mellitus without complications: Secondary | ICD-10-CM

## 2017-01-26 MED ORDER — METFORMIN HCL 500 MG PO TABS: 500.0000 mg | ORAL_TABLET | Freq: Every day | ORAL | 0 refills | Status: DC

## 2017-01-26 NOTE — Telephone Encounter (Signed)
Refilled. LVM advising to schedule OV.

## 2017-02-25 ENCOUNTER — Encounter: Payer: Self-pay | Admitting: Family Medicine

## 2017-02-25 ENCOUNTER — Ambulatory Visit (INDEPENDENT_AMBULATORY_CARE_PROVIDER_SITE_OTHER): Payer: BLUE CROSS/BLUE SHIELD | Admitting: Family Medicine

## 2017-02-25 ENCOUNTER — Other Ambulatory Visit: Payer: Self-pay

## 2017-02-25 VITALS — BP 142/78 | HR 88 | Temp 98.9°F | Resp 16 | Ht 72.5 in | Wt 256.8 lb

## 2017-02-25 DIAGNOSIS — I10 Essential (primary) hypertension: Secondary | ICD-10-CM

## 2017-02-25 DIAGNOSIS — E119 Type 2 diabetes mellitus without complications: Secondary | ICD-10-CM | POA: Diagnosis not present

## 2017-02-25 DIAGNOSIS — M109 Gout, unspecified: Secondary | ICD-10-CM

## 2017-02-25 DIAGNOSIS — M25572 Pain in left ankle and joints of left foot: Secondary | ICD-10-CM

## 2017-02-25 DIAGNOSIS — E785 Hyperlipidemia, unspecified: Secondary | ICD-10-CM

## 2017-02-25 DIAGNOSIS — G8929 Other chronic pain: Secondary | ICD-10-CM | POA: Diagnosis not present

## 2017-02-25 MED ORDER — METFORMIN HCL 500 MG PO TABS: 500.0000 mg | ORAL_TABLET | Freq: Every day | ORAL | 2 refills | Status: DC

## 2017-02-25 MED ORDER — INDOMETHACIN 50 MG PO CAPS: ORAL_CAPSULE | ORAL | 1 refills | Status: DC

## 2017-02-25 MED ORDER — COLCHICINE 0.6 MG PO TABS: ORAL_TABLET | ORAL | 0 refills | Status: DC

## 2017-02-25 MED ORDER — LOSARTAN POTASSIUM 100 MG PO TABS: 100.0000 mg | ORAL_TABLET | Freq: Every day | ORAL | 2 refills | Status: DC

## 2017-02-25 MED ORDER — ATORVASTATIN CALCIUM 10 MG PO TABS: 10.0000 mg | ORAL_TABLET | Freq: Every day | ORAL | 2 refills | Status: DC

## 2017-02-25 NOTE — Progress Notes (Signed)
Subjective:  By signing my name below, I, [name], attest that this documentation has been prepared under the direction and in the presence of [name], MD Electronically Signed: Ladene Artist, ED Scribe 02/25/2017 at 4:29 PM.   Patient ID: Jeffery George, male    DOB: 08-28-1945, 71 y.o.   MRN: [id number]  Chief Complaint  Patient presents with  . Hyperlipidemia    6 month follow up   Jeffery George is a 71 y.o. male who presents to Primary Care at [facility] for follow-up. H/o DM, HTN, Hyperlipidemia.   DM He had come off of Metformin at visit in May for a few months. A1C stable. Decided to continue diet approach, but he has restarted Metformin. States blood glucose was averaging 100-115 in May but started to rise to 120-135 right before he restarted medication 6-8 weeks ago.  Lab Results  Component Value Date   HGBA1C 6.4 (H) 08/20/2016   Lab Results  Component Value Date   MICROALBUR 0.6 12/10/2014   Wt Readings from Last 3 Encounters:  02/25/17 256 lb 12.8 oz (116.5 kg)  08/20/16 261 lb 3.2 oz (118.5 kg)  11/25/15 265 lb (120.2 kg)  Optho: Sees every 3 months with the next appointment in January. Considering cataract surgery Dentist: Sees every 6 months; last seen 2 months ago UTD on immunizations  Gout Lab Results  Component Value Date   LABURIC 6.6 11/25/2015  Treated with colchicine or indomethacin in the past. Pt has had 1 gout flare this year which lasted 2 days with taking 3 colchicine tablets.   Chronic L Ankle Pain Pt states he only used ~30 indomethacin tablets within a year for chronic L ankle pain. States he only takes medicine when L ankle pain flares and is so severe that he has to ambulate with a cane, which is approximately twice a month.  Hyperlipidemia Lab Results  Component Value Date   CHOL 114 08/20/2016   HDL 43 08/20/2016   LDLCALC 53 08/20/2016   TRIG 88 08/20/2016   CHOLHDL 2.7 08/20/2016   Lab Results  Component Value  Date   ALT 29 08/20/2016   AST 24 08/20/2016   ALKPHOS 46 08/20/2016   BILITOT 0.5 08/20/2016  Lipitor 10 mg qd. Denies new myalgias.  HTN Norvasc 5 mg qd and Losartan 100 mg qd. Pt does not check his BP outside of the office. Denies cp, sob, light-headedness, dizziness, HAs, abdominal pain.  Patient Active Problem List   Diagnosis Date Noted  . Gout 02/27/2013  . Essential hypertension, benign 02/27/2013  . Type II or unspecified type diabetes mellitus without mention of complication, uncontrolled 02/27/2013   Past Medical History:  Diagnosis Date  . Arthritis   . Diabetes mellitus without complication ([place])   . Heart murmur    Past Surgical History:  Procedure Laterality Date  . FRACTURE SURGERY     Lt ankle  . HERNIA REPAIR    . VASECTOMY     No Known Allergies Prior to Admission medications   Medication Sig Start Date End Date Taking? Authorizing Provider  ACCU-CHEK SOFTCLIX LANCETS lancets Test blood sugar once daily. Dx E11.9 08/26/15   [name], MD  amLODipine (NORVASC) 5 MG tablet TAKE 1 TABLET DAILY (NEED OFFICE VISIT FOR REFILLS) 01/18/17   [name], MD  aspirin EC 81 MG tablet Take 1 tablet (81 mg total) by mouth daily. 02/27/13   Shawnee Knapp, MD  atorvastatin (LIPITOR) 10  MG tablet TAKE 1 TABLET DAILY 12/15/16   Weber, Damaris Hippo, PA-C  Blood Glucose Monitoring Suppl (BLOOD GLUCOSE MONITOR KIT) KIT Use to test blood sugar twice daily. 07/17/13   [name], MD  colchicine (COLCRYS) 0.6 MG tablet Take 2 tabs po at symptom onset. Repeat 1 tab po 1 hour later. 07/22/15   Ezekiel Slocumb, PA-C  indomethacin (INDOCIN) 50 MG capsule TAKE 1 CAPSULE THREE TIMES A DAY AS NEEDED FOR MODERATE PAIN 07/11/15   [name], MD  Lancets [facility] ULTRASOFT) lancets Use as instructed Patient not taking: Reported on 08/20/2016 06/05/13   [name], MD  losartan (COZAAR) 100 MG tablet TAKE 1 TABLET DAILY 01/18/17   [name], MD  metFORMIN  (GLUCOPHAGE) 500 MG tablet Take 1 tablet (500 mg total) by mouth daily with breakfast. 01/26/17   [name], MD  Omega-3 Fatty Acids (FISH OIL) 1000 MG CAPS Take by mouth.    [provider]  ONE TOUCH ULTRA TEST test strip USE TO TEST BLOOD SUGAR ONCE DAILY 03/02/16   Shawnee Knapp, MD   Social History   Socioeconomic History  . Marital status: Married    Spouse name: Not on file  . Number of children: Not on file  . Years of education: Not on file  . Highest education level: Not on file  Social Needs  . Financial resource strain: Not on file  . Food insecurity - worry: Not on file  . Food insecurity - inability: Not on file  . Transportation needs - medical: Not on file  . Transportation needs - non-medical: Not on file  Occupational History  . Occupation: Educational psychologist  Tobacco Use  . Smoking status: Former Research scientist (life sciences)  . Smokeless tobacco: Never Used  Substance and Sexual Activity  . Alcohol use: No    Alcohol/week: 0.0 oz  . Drug use: No  . Sexual activity: Not on file  Other Topics Concern  . Not on file  Social History Narrative   Married   Counsellor at [employer]  Constitutional: Negative for fatigue and unexpected weight change.  Eyes: Negative for visual disturbance.  Respiratory: Negative for cough, chest tightness and shortness of breath.   Cardiovascular: Negative for chest pain, palpitations and leg swelling.  Gastrointestinal: Negative for abdominal pain and blood in stool.  Musculoskeletal: Positive for arthralgias (chronic L ankle pain). Negative for myalgias.  Neurological: Negative for dizziness, light-headedness and headaches.      Objective:   Physical Exam  Constitutional: He is oriented to person, place, and time. He appears well-developed and well-nourished.  HENT:  Head: Normocephalic and atraumatic.  Eyes: EOM are normal. Pupils are equal, round, and reactive to light.  Neck: No JVD present.  Carotid bruit is not present.  Cardiovascular: Normal rate, regular rhythm and normal heart sounds.  No murmur heard. Pulmonary/Chest: Effort normal and breath sounds normal. He has no rales.  Musculoskeletal: He exhibits no edema.  Neurological: He is alert and oriented to person, place, and time.  Skin: Skin is warm and dry.  Psychiatric: He has a normal mood and affect.  Vitals reviewed.  Vitals:   02/25/17 1546 02/25/17 1704  BP: (!) 144/72 (!) 142/78  Pulse: 88   Resp: 16   Temp: 98.9 F (37.2 C)   TempSrc: Oral   SpO2: 96%   Weight: 256 lb 12.8 oz (116.5 kg)   Height: 6' 0.5" (1.842  m)       Assessment & Plan:   Jeffery George is a 71 y.o. male Type 2 diabetes mellitus without complication, without long-term current use of insulin ([place]) - Plan: Hemoglobin A1c, Microalbumin, urine  Tolerating metformin, check A1c, urine microalbumin.   Hyperlipidemia, unspecified hyperlipidemia type - Plan: Lipid panel, Comprehensive metabolic panel Hyperlipidemia - Plan: atorvastatin (LIPITOR) 10 MG tablet  -Check labs, continue Lipitor same dose as tolerating - Gout, unspecified cause, unspecified chronicity, unspecified site - Plan: Uric Acid, colchicine (COLCRYS) 0.6 MG tablet  -Intermittent symptoms, able to control with colchicine 3 tablets. Refilled for next flare, if increased frequency of this, can consider daily med.   Essential hypertension  - Elevated in office, reported lower than readings. If over 130/80, call to change regimen. Continue Norvasc, losartan same dose for now  Chronic pain of left ankle - Plan: indomethacin (INDOCIN) 50 MG capsule  -Controlled with rare use of indomethacin. No change in treatment at this time.  Meds ordered this encounter  Medications  . colchicine (COLCRYS) 0.6 MG tablet    Sig: Take 2 tabs po at symptom onset. Repeat 1 tab po 1 hour later.    Dispense:  12 tablet    Refill:  0  . atorvastatin (LIPITOR) 10 MG tablet    Sig: Take 1  tablet (10 mg total) daily by mouth.    Dispense:  90 tablet    Refill:  2  . metFORMIN (GLUCOPHAGE) 500 MG tablet    Sig: Take 1 tablet (500 mg total) daily with breakfast by mouth.    Dispense:  90 tablet    Refill:  2  . losartan (COZAAR) 100 MG tablet    Sig: Take 1 tablet (100 mg total) daily by mouth.    Dispense:  90 tablet    Refill:  2  . indomethacin (INDOCIN) 50 MG capsule    Sig: TAKE 1 CAPSULE THREE TIMES A DAY AS NEEDED FOR MODERATE PAIN    Dispense:  30 capsule    Refill:  1   Patient Instructions    I will recheck your diabetes test today, but ok to continue metformin once per day for now.   Keep a record of your blood pressures outside of the office. Goal pressure less than 130/80 with diabetes. If remains over that level - call me and I will send in low dose hydrochlorothiazide once per day.   Colchicine refilled if needed for gout and indomethacin only if needed for ankle pain.  Recheck in 6 months.  Thanks for coming in today.    IF you received an x-ray today, you will receive an invoice from [facility]. Please contact [facility] Radiology at [phone] with questions or concerns regarding your invoice.   IF you received labwork today, you will receive an invoice from [place]. Please contact [facility] at [phone] with questions or concerns regarding your invoice.   Our billing staff will not be able to assist you with questions regarding bills from these companies.  You will be contacted with the lab results as soon as they are available. The fastest way to get your results is to activate your My Chart account. Instructions are located on the last page of this paperwork. If you have not heard from [place] regarding the results in 2 weeks, please contact this office.      I personally performed the services described in this documentation, which was scribed in my presence. The recorded information has been reviewed  and considered for accuracy  and completeness, addended by me as needed, and agree with information above.  Signed,   Merri Ray, MD Primary Care at [place].  02/27/17 1:07 PM

## 2017-02-25 NOTE — Patient Instructions (Addendum)
  I will recheck your diabetes test today, but ok to continue metformin once per day for now.   Keep a record of your blood pressures outside of the office. Goal pressure less than 130/80 with diabetes. If remains over that level - call me and I will send in low dose hydrochlorothiazide once per day.   Colchicine refilled if needed for gout and indomethacin only if needed for ankle pain.  Recheck in 6 months.  Thanks for coming in today.    IF you received an x-ray today, you will receive an invoice from [facility] Radiology. Please contact [facility] Radiology at [phone] with questions or concerns regarding your invoice.   IF you received labwork today, you will receive an invoice from [place]. Please contact [facility] at [phone] with questions or concerns regarding your invoice.   Our billing staff will not be able to assist you with questions regarding bills from these companies.  You will be contacted with the lab results as soon as they are available. The fastest way to get your results is to activate your My Chart account. Instructions are located on the last page of this paperwork. If you have not heard from [place] regarding the results in 2 weeks, please contact this office.

## 2017-02-26 LAB — COMPREHENSIVE METABOLIC PANEL
ALT: 26 IU/L (ref 0–44)
AST: 16 IU/L (ref 0–40)
Albumin/Globulin Ratio: 1.8 (ref 1.2–2.2)
Albumin: 4.6 g/dL (ref 3.5–4.8)
Alkaline Phosphatase: 48 IU/L (ref 39–117)
BUN/Creatinine Ratio: 19 (ref 10–24)
BUN: 17 mg/dL (ref 8–27)
Bilirubin Total: 0.6 mg/dL (ref 0.0–1.2)
CALCIUM: 9.3 mg/dL (ref 8.6–10.2)
CO2: 23 mmol/L (ref 20–29)
CREATININE: 0.91 mg/dL (ref 0.76–1.27)
Chloride: 103 mmol/L (ref 96–106)
GFR calc Af Amer: 98 mL/min/{1.73_m2} (ref >59)
GFR, EST NON AFRICAN AMERICAN: 85 mL/min/{1.73_m2} (ref >59)
Globulin, Total: 2.5 g/dL (ref 1.5–4.5)
Glucose: 119 mg/dL — ABNORMAL HIGH (ref 65–99)
Potassium: 4.6 mmol/L (ref 3.5–5.2)
Sodium: 141 mmol/L (ref 134–144)
TOTAL PROTEIN: 7.1 g/dL (ref 6.0–8.5)

## 2017-02-26 LAB — LIPID PANEL
Chol/HDL Ratio: 2.4 ratio (ref 0.0–5.0)
Cholesterol, Total: 115 mg/dL (ref 100–199)
HDL: 47 mg/dL (ref >39)
LDL Calculated: 52 mg/dL (ref 0–99)
Triglycerides: 79 mg/dL (ref 0–149)
VLDL Cholesterol Cal: 16 mg/dL (ref 5–40)

## 2017-02-26 LAB — URIC ACID: Uric Acid: 6.1 mg/dL (ref 3.7–8.6)

## 2017-02-26 LAB — HEMOGLOBIN A1C
ESTIMATED AVERAGE GLUCOSE: 137 mg/dL
HEMOGLOBIN A1C: 6.4 % — AB (ref 4.8–5.6)

## 2017-02-26 LAB — MICROALBUMIN, URINE: Microalbumin, Urine: 13.5 ug/mL

## 2017-02-27 ENCOUNTER — Telehealth: Payer: Self-pay | Admitting: Family Medicine

## 2017-02-27 NOTE — Telephone Encounter (Signed)
Call patient. Hemoglobin A1c overall looked okay, other electrolytes and urine microalbumin also looked okay. Let me know if there are questions.  Dr. Carlota Raspberry

## 2017-03-01 NOTE — Telephone Encounter (Signed)
See previous note.     OK TO GIVE RESULTS

## 2017-04-18 ENCOUNTER — Other Ambulatory Visit: Payer: Self-pay | Admitting: Family Medicine

## 2017-04-18 DIAGNOSIS — I10 Essential (primary) hypertension: Secondary | ICD-10-CM

## 2017-04-27 DIAGNOSIS — H2513 Age-related nuclear cataract, bilateral: Secondary | ICD-10-CM | POA: Diagnosis not present

## 2017-04-27 DIAGNOSIS — E119 Type 2 diabetes mellitus without complications: Secondary | ICD-10-CM | POA: Diagnosis not present

## 2017-04-27 DIAGNOSIS — H401121 Primary open-angle glaucoma, left eye, mild stage: Secondary | ICD-10-CM | POA: Diagnosis not present

## 2017-04-27 DIAGNOSIS — H40021 Open angle with borderline findings, high risk, right eye: Secondary | ICD-10-CM | POA: Diagnosis not present

## 2017-04-27 DIAGNOSIS — H40051 Ocular hypertension, right eye: Secondary | ICD-10-CM | POA: Diagnosis not present

## 2017-04-27 DIAGNOSIS — H25013 Cortical age-related cataract, bilateral: Secondary | ICD-10-CM | POA: Diagnosis not present

## 2017-05-10 ENCOUNTER — Telehealth: Payer: Self-pay | Admitting: Family Medicine

## 2017-05-10 DIAGNOSIS — E119 Type 2 diabetes mellitus without complications: Secondary | ICD-10-CM

## 2017-05-10 NOTE — Telephone Encounter (Signed)
Pt requesting referral to [facility] ask pt to contact them but he needs referral for ins  Purposes    Best phone for pt is [phone]    Pt states this is URGENT

## 2017-05-11 NOTE — Telephone Encounter (Signed)
Please advise 

## 2017-05-11 NOTE — Telephone Encounter (Signed)
Referral placed.

## 2017-05-12 DIAGNOSIS — H401132 Primary open-angle glaucoma, bilateral, moderate stage: Secondary | ICD-10-CM | POA: Diagnosis not present

## 2017-05-12 DIAGNOSIS — Z01818 Encounter for other preprocedural examination: Secondary | ICD-10-CM | POA: Diagnosis not present

## 2017-05-12 DIAGNOSIS — H401112 Primary open-angle glaucoma, right eye, moderate stage: Secondary | ICD-10-CM | POA: Diagnosis not present

## 2017-05-12 DIAGNOSIS — H2511 Age-related nuclear cataract, right eye: Secondary | ICD-10-CM | POA: Diagnosis not present

## 2017-05-12 DIAGNOSIS — H401122 Primary open-angle glaucoma, left eye, moderate stage: Secondary | ICD-10-CM | POA: Diagnosis not present

## 2017-05-12 DIAGNOSIS — H2513 Age-related nuclear cataract, bilateral: Secondary | ICD-10-CM | POA: Diagnosis not present

## 2017-05-24 DIAGNOSIS — H401112 Primary open-angle glaucoma, right eye, moderate stage: Secondary | ICD-10-CM | POA: Diagnosis not present

## 2017-05-24 DIAGNOSIS — H2511 Age-related nuclear cataract, right eye: Secondary | ICD-10-CM | POA: Diagnosis not present

## 2017-05-24 DIAGNOSIS — H2589 Other age-related cataract: Secondary | ICD-10-CM | POA: Diagnosis not present

## 2017-05-24 DIAGNOSIS — H25811 Combined forms of age-related cataract, right eye: Secondary | ICD-10-CM | POA: Diagnosis not present

## 2017-05-24 DIAGNOSIS — H409 Unspecified glaucoma: Secondary | ICD-10-CM | POA: Diagnosis not present

## 2017-06-07 DIAGNOSIS — H401122 Primary open-angle glaucoma, left eye, moderate stage: Secondary | ICD-10-CM | POA: Diagnosis not present

## 2017-06-07 DIAGNOSIS — H409 Unspecified glaucoma: Secondary | ICD-10-CM | POA: Diagnosis not present

## 2017-06-07 DIAGNOSIS — H2512 Age-related nuclear cataract, left eye: Secondary | ICD-10-CM | POA: Diagnosis not present

## 2017-06-07 DIAGNOSIS — H2589 Other age-related cataract: Secondary | ICD-10-CM | POA: Diagnosis not present

## 2017-06-07 DIAGNOSIS — H25812 Combined forms of age-related cataract, left eye: Secondary | ICD-10-CM | POA: Diagnosis not present

## 2017-06-22 ENCOUNTER — Other Ambulatory Visit: Payer: Self-pay

## 2017-06-22 ENCOUNTER — Ambulatory Visit (INDEPENDENT_AMBULATORY_CARE_PROVIDER_SITE_OTHER): Payer: Medicare HMO | Admitting: Family Medicine

## 2017-06-22 ENCOUNTER — Encounter: Payer: Self-pay | Admitting: Family Medicine

## 2017-06-22 VITALS — BP 137/80 | HR 76 | Temp 99.1°F | Resp 17 | Ht 72.5 in | Wt 260.0 lb

## 2017-06-22 DIAGNOSIS — R011 Cardiac murmur, unspecified: Secondary | ICD-10-CM | POA: Diagnosis not present

## 2017-06-22 DIAGNOSIS — G8929 Other chronic pain: Secondary | ICD-10-CM | POA: Diagnosis not present

## 2017-06-22 DIAGNOSIS — E119 Type 2 diabetes mellitus without complications: Secondary | ICD-10-CM

## 2017-06-22 DIAGNOSIS — M109 Gout, unspecified: Secondary | ICD-10-CM

## 2017-06-22 DIAGNOSIS — M25572 Pain in left ankle and joints of left foot: Secondary | ICD-10-CM | POA: Diagnosis not present

## 2017-06-22 DIAGNOSIS — I10 Essential (primary) hypertension: Secondary | ICD-10-CM

## 2017-06-22 DIAGNOSIS — E785 Hyperlipidemia, unspecified: Secondary | ICD-10-CM | POA: Diagnosis not present

## 2017-06-22 LAB — HEMOGLOBIN A1C
Est. average glucose Bld gHb Est-mCnc: 140 mg/dL
HEMOGLOBIN A1C: 6.5 % — AB (ref 4.8–5.6)

## 2017-06-22 MED ORDER — COLCHICINE 0.6 MG PO TABS: ORAL_TABLET | ORAL | 0 refills | Status: DC

## 2017-06-22 MED ORDER — GLUCOSE BLOOD VI STRP: 1.0000 | ORAL_STRIP | Freq: Every day | 1 refills | Status: DC

## 2017-06-22 MED ORDER — METFORMIN HCL 500 MG PO TABS: 500.0000 mg | ORAL_TABLET | Freq: Every day | ORAL | 2 refills | Status: DC

## 2017-06-22 MED ORDER — ATORVASTATIN CALCIUM 10 MG PO TABS: 10.0000 mg | ORAL_TABLET | Freq: Every day | ORAL | 2 refills | Status: DC

## 2017-06-22 MED ORDER — AMLODIPINE BESYLATE 5 MG PO TABS: ORAL_TABLET | ORAL | 2 refills | Status: DC

## 2017-06-22 MED ORDER — LOSARTAN POTASSIUM 100 MG PO TABS: 100.0000 mg | ORAL_TABLET | Freq: Every day | ORAL | 2 refills | Status: DC

## 2017-06-22 MED ORDER — INDOMETHACIN 50 MG PO CAPS: ORAL_CAPSULE | ORAL | 1 refills | Status: DC

## 2017-06-22 NOTE — Progress Notes (Signed)
Subjective:    Patient ID: Jeffery George, male    DOB: 05-22-1945, 72 y.o.   MRN: [id number]  HPI Jeffery George is a 72 y.o. male Presents today for: Chief Complaint  Patient presents with  . Chronic Conditions    3 month follow-up    On new insurance - needs meds sent in.   Diabetes:  -Takes metformin 500 mg daily. Controlled in November. He is taking ARB, statin, aspirin.  Lab Results  Component Value Date   HGBA1C 6.4 (H) 02/25/2017  Urine microalbumin normal at 13.5 in November. Ophthalmology exam 05/17/17.   - had cataracts fixed 2 weeks ago, and 1 month ago. Also stents in eyes to relieve pressure from glaucoma. Dr. Alois Cliche. Can see without glasses. No known retinopathy.  Foot Exam 08/20/16 Pneumonia vaccination 06/12/15 Exercise: walking at work and housework.  Wt Readings from Last 3 Encounters:  06/22/17 260 lb (117.9 kg)  02/25/17 256 lb 12.8 oz (116.5 kg)  08/20/16 261 lb 3.2 oz (118.5 kg)   Hypertension: Takes amlodipine 5 mg daily, losartan 100 mg daily. BP Readings from Last 3 Encounters:  06/22/17 137/80  02/25/17 (!) 142/78  08/20/16 (!) 142/72   Lab Results  Component Value Date   CREATININE 0.91 02/25/2017   Hyperlipidemia: He takes Lipitor 71m QD. Controlled at last check.  Lab Results  Component Value Date   CHOL 115 02/25/2017   HDL 47 02/25/2017   LDLCALC 52 02/25/2017   TRIG 79 02/25/2017   CHOLHDL 2.4 02/25/2017   Lab Results  Component Value Date   ALT 26 02/25/2017   AST 16 02/25/2017   ALKPHOS 48 02/25/2017   BILITOT 0.6 02/25/2017     Gout: Treated with colchicine or indomethacin in the past 1 gout flare last year that was treated with colchicine. Has had some chronic ankle pain on the left which he uses indomethacin approximately 30 per year only when it flares. Uses a cane during flares approximately twice per month when discussed at last visit. No gout flare since last visit.    Patient Active Problem List   Diagnosis  Date Noted  . Gout 02/27/2013  . Essential hypertension, benign 02/27/2013  . Type II or unspecified type diabetes mellitus without mention of complication, uncontrolled 02/27/2013   Past Medical History:  Diagnosis Date  . Arthritis   . [place]   . Heart murmur    Past Surgical History:  Procedure Laterality Date  . FRACTURE SURGERY     Lt ankle  . HERNIA REPAIR    . VASECTOMY     No Known Allergies Prior to Admission medications   Medication Sig Start Date End Date Taking? Authorizing Provider  ACCU-CHEK SOFTCLIX LANCETS lancets Test blood sugar once daily. Dx E11.9 08/26/15  Yes GWendie Agreste MD  amLODipine (NORVASC) 5 MG tablet TAKE 1 TABLET DAILY (NEED OFFICE VISIT FOR REFILLS) 04/19/17  Yes GWendie Agreste MD  aspirin EC 81 MG tablet Take 1 tablet (81 mg total) by mouth daily. 02/27/13  Yes SShawnee Knapp MD  atorvastatin (LIPITOR) 10 MG tablet Take 1 tablet (10 mg total) daily by mouth. 02/25/17  Yes GWendie Agreste MD  Blood Glucose Monitoring Suppl (BLOOD GLUCOSE MONITOR KIT) KIT Use to test blood sugar twice daily. 07/17/13  Yes GWendie Agreste MD  colchicine (COLCRYS) 0.6 MG tablet Take 2 tabs po at symptom onset. Repeat 1 tab po 1 hour later. 02/25/17  Yes [name], MD  indomethacin (INDOCIN) 50 MG capsule TAKE 1 CAPSULE THREE TIMES A DAY AS NEEDED FOR MODERATE PAIN 02/25/17  Yes [name], MD  Lancets [facility] ULTRASOFT) lancets Use as instructed 06/05/13  Yes [name], MD  losartan (COZAAR) 100 MG tablet Take 1 tablet (100 mg total) daily by mouth. 02/25/17  Yes [name], MD  metFORMIN (GLUCOPHAGE) 500 MG tablet Take 1 tablet (500 mg total) daily with breakfast by mouth. 02/25/17  Yes [name], MD  Omega-3 Fatty Acids (FISH OIL) 1000 MG CAPS Take by mouth.   Yes [provider]  ONE TOUCH ULTRA TEST test strip USE TO TEST BLOOD SUGAR ONCE DAILY 03/02/16  Yes Shawnee Knapp, MD     Social History   Socioeconomic History  . Marital status: Married    Spouse name: Not on file  . Number of children: Not on file  . Years of education: Not on file  . Highest education level: Not on file  Social Needs  . Financial resource strain: Not on file  . Food insecurity - worry: Not on file  . Food insecurity - inability: Not on file  . Transportation needs - medical: Not on file  . Transportation needs - non-medical: Not on file  Occupational History  . Occupation: Educational psychologist  Tobacco Use  . Smoking status: Former Research scientist (life sciences)  . Smokeless tobacco: Never Used  Substance and Sexual Activity  . Alcohol use: No    Alcohol/week: 0.0 oz  . Drug use: No  . Sexual activity: No  Other Topics Concern  . Not on file  Social History Narrative   Married   Counsellor at [place]  Constitutional: Negative for fatigue and unexpected weight change.  Eyes: Negative for visual disturbance.  Respiratory: Negative for cough, chest tightness and shortness of breath.   Cardiovascular: Negative for chest pain, palpitations and leg swelling.  Gastrointestinal: Negative for abdominal pain and blood in stool.  Neurological: Negative for dizziness, light-headedness and headaches.       Objective:   Physical Exam  Constitutional: He is oriented to person, place, and time. He appears well-developed and well-nourished.  HENT:  Head: Normocephalic and atraumatic.  Eyes: EOM are normal. Pupils are equal, round, and reactive to light.  Neck: No JVD present. Carotid bruit is not present.  Cardiovascular: Normal rate and regular rhythm.  Murmur (3/6 systolic. ) heard. Pulmonary/Chest: Effort normal and breath sounds normal. He has no rales.  Musculoskeletal: He exhibits no edema.  Neurological: He is alert and oriented to person, place, and time.  Skin: Skin is warm and dry.  Psychiatric: He has a normal mood and affect. His behavior is normal.   Vitals reviewed.  Vitals:   06/22/17 0938  BP: 137/80  Pulse: 76  Resp: 17  Temp: 99.1 F (37.3 C)  TempSrc: Oral  SpO2: 98%  Weight: 260 lb (117.9 kg)  Height: 6' 0.5" (1.842 m)       Assessment & Plan:   [name] is a 72 y.o. male Controlled type 2 diabetes mellitus without complication, without long-term current use of insulin ([place]) - Plan: Hemoglobin A1c, metFORMIN (GLUCOPHAGE) 500 MG tablet  - Stable previously, tolerating metformin, no changes, A1c pending  Essential hypertension, benign - Plan: losartan (COZAAR) 100 MG tablet, amLODipine (NORVASC) 5 MG tablet  -Improved control, near goal. Increase exercise, continue same medications for now  Hyperlipidemia, unspecified hyperlipidemia type - Plan: atorvastatin (LIPITOR) 10 MG tablet  - Tolerating Lipitor, continue same dose, plan for fasting lipids next visit  Gout, unspecified cause, unspecified chronicity, unspecified site - Plan: colchicine (COLCRYS) 0.6 MG tablet Acute gout of multiple sites, unspecified cause  - Overall stable,  colchicine if needed  Chronic pain of left ankle - Plan: indomethacin (INDOCIN) 50 MG capsule if needed, infrequent use  Heart murmur - Plan: ECHOCARDIOGRAM COMPLETE  - Reports long-standing history of murmur, asymptomatic, did not have echo, we order echo to evaluate for aortic stenosis versus valvular issue.   Meds ordered this encounter  Medications  . losartan (COZAAR) 100 MG tablet    Sig: Take 1 tablet (100 mg total) by mouth daily.    Dispense:  90 tablet    Refill:  2  . metFORMIN (GLUCOPHAGE) 500 MG tablet    Sig: Take 1 tablet (500 mg total) by mouth daily with breakfast.    Dispense:  90 tablet    Refill:  2  . atorvastatin (LIPITOR) 10 MG tablet    Sig: Take 1 tablet (10 mg total) by mouth daily.    Dispense:  90 tablet    Refill:  2  . glucose blood (ONE TOUCH ULTRA TEST) test strip    Sig: 1 each by Other route daily. Use as instructed    Dispense:  100  each    Refill:  1  . amLODipine (NORVASC) 5 MG tablet    Sig: TAKE 1 TABLET DAILY    Dispense:  90 tablet    Refill:  2  . indomethacin (INDOCIN) 50 MG capsule    Sig: TAKE 1 CAPSULE THREE TIMES A DAY AS NEEDED FOR MODERATE PAIN    Dispense:  30 capsule    Refill:  1  . colchicine (COLCRYS) 0.6 MG tablet    Sig: Take 2 tabs po at symptom onset. Repeat 1 tab po 1 hour later.    Dispense:  12 tablet    Refill:  0   Patient Instructions    Increase exercise to most days per week, low to moderate intensity exercise 150 minutes per week. That should help with blood pressure, diabetes and cholesterol.   No change in meds for now. Recheck in 6 months for fasting cholesterol.   I will reorder the echo test for heart murmur.   Thanks for coming in today.   IF you received an x-ray today, you will receive an invoice from [facility] Radiology. Please contact [facility] Radiology at [phone] with questions or concerns regarding your invoice.   IF you received labwork today, you will receive an invoice from [place]. Please contact [facility] at [phone] with questions or concerns regarding your invoice.   Our billing staff will not be able to assist you with questions regarding bills from these companies.  You will be contacted with the lab results as soon as they are available. The fastest way to get your results is to activate your My Chart account. Instructions are located on the last page of this paperwork. If you have not heard from [place] regarding the results in 2 weeks, please contact this office.       I personally performed the services described in this documentation, which was scribed in my presence. The recorded information has been reviewed and considered for accuracy and completeness, addended by me as needed, and agree with information above.  Signed,   Merri Ray, MD Primary Care at [facility].  06/22/17 10:36 AM

## 2017-06-22 NOTE — Patient Instructions (Addendum)
  Increase exercise to most days per week, low to moderate intensity exercise 150 minutes per week. That should help with blood pressure, diabetes and cholesterol.   No change in meds for now. Recheck in 6 months for fasting cholesterol.   I will reorder the echo test for heart murmur.   Thanks for coming in today.   IF you received an x-ray today, you will receive an invoice from [facility]. Please contact [facility]iology at [phone] with questions or concerns regarding your invoice.   IF you received labwork today, you will receive an invoice from [place]. Please contact [facility] at [phone] with questions or concerns regarding your invoice.   Our billing staff will not be able to assist you with questions regarding bills from these companies.  You will be contacted with the lab results as soon as they are available. The fastest way to get your results is to activate your My Chart account. Instructions are located on the last page of this paperwork. If you have not heard from [place] regarding the results in 2 weeks, please contact this office.

## 2017-07-02 ENCOUNTER — Telehealth: Payer: Self-pay

## 2017-07-02 NOTE — Telephone Encounter (Signed)
PA Started for One Touch Ultra Blue Strips Awaiting Response Key [id number]

## 2017-07-05 ENCOUNTER — Encounter: Payer: Self-pay | Admitting: *Deleted

## 2017-07-06 NOTE — Telephone Encounter (Signed)
Denied for test trips.

## 2017-07-06 NOTE — Telephone Encounter (Signed)
Denial letter put in box

## 2017-07-17 ENCOUNTER — Other Ambulatory Visit: Payer: Self-pay | Admitting: Family Medicine

## 2017-07-17 DIAGNOSIS — I10 Essential (primary) hypertension: Secondary | ICD-10-CM

## 2017-07-21 ENCOUNTER — Encounter (HOSPITAL_COMMUNITY): Payer: Self-pay | Admitting: Radiology

## 2017-07-26 ENCOUNTER — Telehealth: Payer: Self-pay

## 2017-07-26 NOTE — Telephone Encounter (Signed)
Pt came into office with denial letter from [facility].  [employer] will not cover the test strips ordered in March. (Does not appear that they cover any strips compatible with his unit?)  [employer] will cover the following:  TrueMetrix, TruTrack, Accu-Chek Aviva Expert, Accu-Chek Aviva Plus, Accu-Chek Guide, Accu-Chek Nano, Accu-Chek Smartview.  Pt requests that we order new glucometer and new strips via [employer]. Please route to clinical message pool if we can assist in any way.

## 2017-07-31 MED ORDER — BLOOD GLUCOSE METER KIT: PACK | 0 refills | Status: AC

## 2017-07-31 NOTE — Telephone Encounter (Signed)
New meter rx printed. Will have faxed to [facility] mail order pharmacy.

## 2017-07-31 NOTE — Addendum Note (Signed)
Addended by: Merri Ray R on: 07/31/2017 10:23 AM   Modules accepted: Orders

## 2017-08-11 ENCOUNTER — Encounter (INDEPENDENT_AMBULATORY_CARE_PROVIDER_SITE_OTHER): Payer: Self-pay | Admitting: Ophthalmology

## 2017-08-11 ENCOUNTER — Ambulatory Visit (INDEPENDENT_AMBULATORY_CARE_PROVIDER_SITE_OTHER): Payer: Medicare HMO | Admitting: Ophthalmology

## 2017-08-11 DIAGNOSIS — H3322 Serous retinal detachment, left eye: Secondary | ICD-10-CM

## 2017-08-11 DIAGNOSIS — H401131 Primary open-angle glaucoma, bilateral, mild stage: Secondary | ICD-10-CM | POA: Diagnosis not present

## 2017-08-11 DIAGNOSIS — Z961 Presence of intraocular lens: Secondary | ICD-10-CM | POA: Diagnosis not present

## 2017-08-11 DIAGNOSIS — H3581 Retinal edema: Secondary | ICD-10-CM | POA: Diagnosis not present

## 2017-08-11 NOTE — Progress Notes (Signed)
[facility] Note  08/11/2017     CHIEF COMPLAINT Patient presents for Retina Evaluation   HISTORY OF PRESENT ILLNESS: Jeffery George is a 72 y.o. male who presents to the clinic today for:   HPI    Retina Evaluation    In left eye.  This started 2 days ago.  Duration of 2 days.  Associated Symptoms Floaters and Blind Spot.  Negative for Redness, Glare, Scalp Tenderness, Shoulder/Hip pain, Weight Loss, Fatigue, Fever, Jaw Claudication, Trauma, Photophobia, Pain, Distortion and Flashes.  Context:  distance vision and mid-range vision.  Treatments tried include no treatments.  I, the attending physician,  performed the HPI with the patient and updated documentation appropriately.          Comments    Pt presents on the referral of Dr. Parke Simmers for possible RD OS; Pt states he noticed floaters x 10 days ago; Pt states x 1 day ago he began to notice a "dark shadow coming up from the bottom of my vision"; Pt denies any flashes of light, pt denies ocular pain; Pt states he recently had cataract sx OU, states he also had "2 stents put in my eyes for my pressure"; Pt states he had sx done at [facility] by "Dr. B"; Pt states he has been diabetic x 6 years, pt states last CBG was 118, last A1C was 6.4; pt states he takes metformin to control DM;        Last edited by Bernarda Caffey, MD on 08/11/2017  6:20 PM. (History)      Referring physician: Juluis Rainier [address] [place], [place] [address]  HISTORICAL INFORMATION:   Selected notes from the MEDICAL RECORD NUMBER Referred by Dr. Parke Simmers for possible RD OS LEE- 05.01.19  Ocular Hx- pseudophakia OU with dual I-stent (OD 02.11.19 OS 02.25.19 with Dr. Cher Nakai),  PMH- DM, heart murmur    CURRENT MEDICATIONS: Current Outpatient Medications (Ophthalmic Drugs)  Medication Sig  . latanoprost (XALATAN) 0.005 % ophthalmic solution    No current facility-administered medications for this visit.  (Ophthalmic  Drugs)   Current Outpatient Medications (Other)  Medication Sig  . ACCU-CHEK SOFTCLIX LANCETS lancets Test blood sugar once daily. Dx E11.9  . amLODipine (NORVASC) 5 MG tablet TAKE 1 TABLET DAILY  . aspirin EC 81 MG tablet Take 1 tablet (81 mg total) by mouth daily.  Marland Kitchen atorvastatin (LIPITOR) 10 MG tablet Take 1 tablet (10 mg total) by mouth daily.  . blood glucose meter kit and supplies TrueMetrix, TruTrack, Accu-Chek Aviva Expert, Accu-Chek Aviva Plus, Accu-Chek Guide, Accu-Chek Nano, or Accu-Chek Smartview.   Use once per day.  . Blood Glucose Monitoring Suppl (BLOOD GLUCOSE MONITOR KIT) KIT Use to test blood sugar twice daily.  . colchicine (COLCRYS) 0.6 MG tablet Take 2 tabs po at symptom onset. Repeat 1 tab po 1 hour later.  Marland Kitchen glucose blood (ONE TOUCH ULTRA TEST) test strip 1 each by Other route daily. Use as instructed  . indomethacin (INDOCIN) 50 MG capsule TAKE 1 CAPSULE THREE TIMES A DAY AS NEEDED FOR MODERATE PAIN  . Lancets (ONETOUCH ULTRASOFT) lancets Use as instructed  . losartan (COZAAR) 100 MG tablet Take 1 tablet (100 mg total) by mouth daily.  . metFORMIN (GLUCOPHAGE) 500 MG tablet Take 1 tablet (500 mg total) by mouth daily with breakfast.  . Omega-3 Fatty Acids (FISH OIL) 1000 MG CAPS Take by mouth.   No current facility-administered medications for this  visit.  (Other)      REVIEW OF SYSTEMS: ROS    Positive for: Endocrine, Eyes   Negative for: Constitutional, Gastrointestinal, Neurological, Skin, Genitourinary, Musculoskeletal, HENT, Cardiovascular, Respiratory, Psychiatric, Allergic/Imm, Heme/Lymph   Last edited by Cherrie Gauze, COA on 08/11/2017  5:40 PM. (History)       ALLERGIES No Known Allergies  PAST MEDICAL HISTORY Past Medical History:  Diagnosis Date  . Arthritis   . Diabetes mellitus without complication ([place])   . Heart murmur    Past Surgical History:  Procedure Laterality Date  . CATARACT EXTRACTION    . FRACTURE SURGERY     Lt  ankle  . HERNIA REPAIR    . IRIDOTOMY / IRIDECTOMY Bilateral    stents  . VASECTOMY      FAMILY HISTORY Family History  Problem Relation Age of Onset  . Cancer Mother   . Diabetes Mother   . Stroke Mother   . Heart disease Father   . Stroke Father   . Diabetes Brother   . Heart disease Brother   . Diabetes Brother   . Colon cancer Neg Hx     SOCIAL HISTORY Social History   Tobacco Use  . Smoking status: Former Research scientist (life sciences)  . Smokeless tobacco: Never Used  Substance Use Topics  . Alcohol use: No    Alcohol/week: 0.0 oz  . Drug use: No         OPHTHALMIC EXAM:  Base Eye Exam    Visual Acuity (Snellen - Linear)      Right Left   Dist cc 20/20 20/150   Dist ph cc 20/20 NI   Correction:  Glasses       Tonometry (Tonopen, 5:48 PM)      Right Left   Pressure 17 19       Pupils      Dark Light Shape React APD   Right 4 3 Round Brisk None   Left 8 8 Round NR None       Visual Fields (Counting fingers)      Left Right     Full   Restrictions Total inferior temporal, inferior nasal deficiencies        Extraocular Movement      Right Left    Full, Ortho Full, Ortho       Neuro/Psych    Oriented x3:  Yes   Mood/Affect:  Normal       Dilation    Both eyes:  1.0% Mydriacyl, 2.5% Phenylephrine @ 5:48 PM        Slit Lamp and Fundus Exam    Slit Lamp Exam      Right Left   Lids/Lashes Dermatochalasis - upper lid Dermatochalasis - upper lid   Conjunctiva/Sclera White and quiet White and quiet   Cornea Arcus, Well healed cataract wounds Arcus, Well healed cataract wounds, Inferior 1+ Punctate epithelial erosions   Anterior Chamber Deep and quiet, stent at 0230 and 0300 angle  Deep and quiet   Iris Round and dilated to 7.11m Round and dilated to 882m  Lens PC IOL in good position, trace Posterior capsular opacification PC IOL in good position   Vitreous Vitreous syneresis Vitreous syneresis, +tobacco dusting       Fundus Exam      Right Left   Disc  360 Peripapillary atrophy 360 Peripapillary atrophy, +cupping   C/D Ratio 0.6 0.6   Macula Flat, Retinal pigment epithelial mottling, Drusen, No heme or edema SRF superiorly  splitting fovea   Vessels Mild Vascular attenuation Mild Vascular attenuation   Periphery Attached, scattered peripheral drusen / RPE changes, No RT/RD on 360 scleral depressed exam Bullous superior RD from 1030 to 0230, small area of thinning with hole at 0130, vitreous debris settling inferiorly, inferior cobblestoning         Refraction    Wearing Rx      Sphere Cylinder Axis Add   Right -1.25 +2.50 012 +2.75   Left -1.50 +2.25 003 +2.75   Age:  87m  Type:  PAL          IMAGING AND PROCEDURES  Imaging and Procedures for 08/11/17  OCT, Retina - OU - Both Eyes       Right Eye Quality was good. Central Foveal Thickness: 263. Progression has no prior data. Findings include normal foveal contour, no IRF, no SRF, retinal drusen  (Trace ERM).   Left Eye Quality was good. Central Foveal Thickness: 466. Progression has no prior data. Findings include intraretinal fluid, subretinal fluid.   Notes *Images captured and stored on drive  Diagnosis / Impression:  OD: NFP, No IRF/SRF; scattered drusen OS: fovea involving superior RD with intraretinal cystic changes in the detached retina  Clinical management:  See below  Abbreviations: NFP - Normal foveal profile. CME - cystoid macular edema. PED - pigment epithelial detachment. IRF - intraretinal fluid. SRF - subretinal fluid. EZ - ellipsoid zone. ERM - epiretinal membrane. ORA - outer retinal atrophy. ORT - outer retinal tubulation. SRHM - subretinal hyper-reflective material                  ASSESSMENT/PLAN:    ICD-10-CM   1. Left retinal detachment H33.22   2. Retinal edema H35.81 OCT, Retina - OU - Both Eyes  3. Pseudophakia of both eyes Z96.1   4. Primary open angle glaucoma (POAG) of both eyes, mild stage H40.1131     1. Retinal  detachment, OS-  The incidence, risk factors, and natural history of retinal detachment was discussed with patient.  Potential treatment options including delimiting laser, pneumatic retinopexy, scleral buckle, and vitrectomy, cryotherapy and laser, and the use of air, gas, and oil discussed with patient.  The risks of blindness, loss of vision, infection, hemorrhage, cataract progression or lens displacement were discussed with patient. - bullous superior detachment from 1030 to 0230, fovea splitting - no frank HST, small break at 130 within patch of thin retina - recommend SBP + PPV/EL/Gas OS under general anesthesia - RBA of procedure discussed, questions answered - informed consent obtained and signed - [facility], scheduled for tomorrow (08/12/17), 12 noon  2. Retinal edema - mild IRF within detached retina  3. Pseudophakia OU  - s/p CE/IOL w/ iStent OU by Dr. BCher Nakai - beautiful surgeries, doing well  - monitor  4. POAG OU - s/p iStents OU as above - IOP okay today - under the expert management of Dr. BCher Nakai  Ophthalmic Meds Ordered this visit:  No orders of the defined types were placed in this encounter.      Return in about 2 days (around 08/13/2017) for POV.  There are no Patient Instructions on file for this visit.   Explained the diagnoses, plan, and follow up with the patient and they expressed understanding.  Patient expressed understanding of the importance of proper follow up care.   This document serves as a record of services personally performed by BGardiner Sleeper  MD, PhD. It was created on their behalf by Catha Brow, [place], a certified ophthalmic assistant. The creation of this record is the provider's dictation and/or activities during the visit.  Electronically signed by: Catha Brow, [place]  08/11/17 7:48 PM   Gardiner Sleeper, M.D., Ph.D. Diseases & Surgery of the Retina and Vitreous [facility] 08/11/17   I have reviewed the above documentation for accuracy and completeness, and I agree with the above. Gardiner Sleeper, M.D., Ph.D. 08/11/17 7:48 PM    Abbreviations: M myopia (nearsighted); A astigmatism; H hyperopia (farsighted); P presbyopia; Mrx spectacle prescription;  CTL contact lenses; OD right eye; OS left eye; OU both eyes  XT exotropia; ET esotropia; PEK punctate epithelial keratitis; PEE punctate epithelial erosions; DES dry eye syndrome; MGD meibomian gland dysfunction; ATs artificial tears; PFAT's preservative free artificial tears; [place] nuclear sclerotic cataract; PSC posterior subcapsular cataract; ERM epi-retinal membrane; PVD posterior vitreous detachment; RD retinal detachment; DM diabetes mellitus; DR diabetic retinopathy; NPDR non-proliferative diabetic retinopathy; PDR proliferative diabetic retinopathy; CSME clinically significant macular edema; DME diabetic macular edema; dbh dot blot hemorrhages; CWS cotton wool spot; POAG primary open angle glaucoma; C/D cup-to-disc ratio; HVF humphrey visual field; GVF goldmann visual field; OCT optical coherence tomography; IOP intraocular pressure; BRVO Branch retinal vein occlusion; CRVO central retinal vein occlusion; CRAO central retinal artery occlusion; BRAO branch retinal artery occlusion; RT retinal tear; SB scleral buckle; PPV pars plana vitrectomy; VH Vitreous hemorrhage; PRP panretinal laser photocoagulation; IVK intravitreal kenalog; VMT vitreomacular traction; MH Macular hole;  NVD neovascularization of the disc; NVE neovascularization elsewhere; AREDS age related eye disease study; ARMD age related macular degeneration; POAG primary open angle glaucoma; EBMD epithelial/anterior basement membrane dystrophy; ACIOL anterior chamber intraocular lens; IOL intraocular lens; PCIOL posterior chamber intraocular lens; Phaco/IOL phacoemulsification with intraocular lens placement; [place] photorefractive keratectomy; LASIK laser  assisted in situ keratomileusis; HTN hypertension; DM diabetes mellitus; COPD chronic obstructive pulmonary disease

## 2017-08-11 NOTE — H&P (Signed)
Jeffery George is an 72 y.o. male.    Chief Complaint: Retinal detachment, left eye  HPI: Pt reports 1 wk history of progressively blurry vision, left eye. Noted to have a macula-involving superior retinal detachment OS on dilated exam.  Past Medical History:  Diagnosis Date  . Arthritis   . Diabetes mellitus without complication ([place])   . Heart murmur     Past Surgical History:  Procedure Laterality Date  . CATARACT EXTRACTION    . FRACTURE SURGERY     Lt ankle  . HERNIA REPAIR    . IRIDOTOMY / IRIDECTOMY Bilateral    stents  . VASECTOMY      Family History  Problem Relation Age of Onset  . Cancer Mother   . Diabetes Mother   . Stroke Mother   . Heart disease Father   . Stroke Father   . Diabetes Brother   . Heart disease Brother   . Diabetes Brother   . Colon cancer Neg Hx    Social History:  reports that he has quit smoking. He has never used smokeless tobacco. He reports that he does not drink alcohol or use drugs.  Allergies: No Known Allergies  No medications prior to admission.    Review of systems otherwise negative  There were no vitals taken for this visit.  Physical exam: Mental status: oriented x3. Eyes: See eye exam associated with this date of surgery Ears, Nose, Throat: within normal limits Neck: Within Normal limits General: within normal limits Chest: Within normal limits Breast: deferred Heart: Within normal limits Abdomen: Within normal limits GU: deferred Extremities: within normal limits Skin: within normal limits  Assessment/Plan 1. Macula-involving, fovea-splitting, retinal detachment, left eye 2. Pseudophakia OU  Plan: To [facility] for scleral buckle procedure + 25 gauge pars plana vitrectomy w/ endolaser and gas, left eye, under general anesthesia - case scheduled for 5.2.19, 12 noon   Gardiner Sleeper, M.D., Ph.D. Vitreoretinal Surgeon [facility] 08/11/17

## 2017-08-12 ENCOUNTER — Encounter (HOSPITAL_COMMUNITY)
Admission: RE | Disposition: A | Discharge status: Home / Self Care | Payer: Self-pay | Source: Ambulatory Visit | Attending: Ophthalmology

## 2017-08-12 ENCOUNTER — Ambulatory Visit (HOSPITAL_COMMUNITY)
Admission: RE | Admit: 2017-08-12 | Discharge: 2017-08-12 | Disposition: A | Discharge status: Home / Self Care | Payer: Medicare HMO | Source: Ambulatory Visit | Attending: Ophthalmology | Admitting: Ophthalmology

## 2017-08-12 ENCOUNTER — Other Ambulatory Visit: Payer: Self-pay

## 2017-08-12 ENCOUNTER — Inpatient Hospital Stay (HOSPITAL_COMMUNITY): Payer: Medicare HMO | Admitting: Certified Registered Nurse Anesthetist

## 2017-08-12 ENCOUNTER — Encounter (HOSPITAL_COMMUNITY): Payer: Self-pay | Admitting: Urology

## 2017-08-12 DIAGNOSIS — Z87891 Personal history of nicotine dependence: Secondary | ICD-10-CM | POA: Diagnosis not present

## 2017-08-12 DIAGNOSIS — I10 Essential (primary) hypertension: Secondary | ICD-10-CM | POA: Diagnosis not present

## 2017-08-12 DIAGNOSIS — Z7982 Long term (current) use of aspirin: Secondary | ICD-10-CM | POA: Diagnosis not present

## 2017-08-12 DIAGNOSIS — Z7984 Long term (current) use of oral hypoglycemic drugs: Secondary | ICD-10-CM | POA: Diagnosis not present

## 2017-08-12 DIAGNOSIS — M109 Gout, unspecified: Secondary | ICD-10-CM | POA: Diagnosis not present

## 2017-08-12 DIAGNOSIS — E119 Type 2 diabetes mellitus without complications: Secondary | ICD-10-CM | POA: Diagnosis not present

## 2017-08-12 DIAGNOSIS — H338 Other retinal detachments: Secondary | ICD-10-CM | POA: Diagnosis not present

## 2017-08-12 DIAGNOSIS — E1169 Type 2 diabetes mellitus with other specified complication: Secondary | ICD-10-CM | POA: Diagnosis not present

## 2017-08-12 DIAGNOSIS — H33022 Retinal detachment with multiple breaks, left eye: Secondary | ICD-10-CM | POA: Insufficient documentation

## 2017-08-12 DIAGNOSIS — H3322 Serous retinal detachment, left eye: Secondary | ICD-10-CM | POA: Diagnosis present

## 2017-08-12 DIAGNOSIS — Z9849 Cataract extraction status, unspecified eye: Secondary | ICD-10-CM | POA: Insufficient documentation

## 2017-08-12 DIAGNOSIS — Z8249 Family history of ischemic heart disease and other diseases of the circulatory system: Secondary | ICD-10-CM | POA: Diagnosis not present

## 2017-08-12 DIAGNOSIS — Z961 Presence of intraocular lens: Secondary | ICD-10-CM | POA: Diagnosis not present

## 2017-08-12 DIAGNOSIS — Z79899 Other long term (current) drug therapy: Secondary | ICD-10-CM | POA: Diagnosis not present

## 2017-08-12 HISTORY — PX: GAS/FLUID EXCHANGE: SHX5334

## 2017-08-12 HISTORY — DX: Essential (primary) hypertension: I10

## 2017-08-12 HISTORY — PX: VITRECTOMY 25 GAUGE WITH SCLERAL BUCKLE: SHX6183

## 2017-08-12 LAB — BASIC METABOLIC PANEL
ANION GAP: 10 (ref 5–15)
BUN: 17 mg/dL (ref 6–20)
CHLORIDE: 106 mmol/L (ref 101–111)
CO2: 24 mmol/L (ref 22–32)
Calcium: 9.1 mg/dL (ref 8.9–10.3)
Creatinine, Ser: 0.91 mg/dL (ref 0.61–1.24)
GFR calc Af Amer: >60 mL/min (ref >60)
GLUCOSE: 130 mg/dL — AB (ref 65–99)
POTASSIUM: 4.1 mmol/L (ref 3.5–5.1)
Sodium: 140 mmol/L (ref 135–145)

## 2017-08-12 LAB — CBC
HEMATOCRIT: 41.9 % (ref 39.0–52.0)
HEMOGLOBIN: 14.2 g/dL (ref 13.0–17.0)
MCH: 32.7 pg (ref 26.0–34.0)
MCHC: 33.9 g/dL (ref 30.0–36.0)
MCV: 96.5 fL (ref 78.0–100.0)
PLATELETS: 189 10*3/uL (ref 150–400)
RBC: 4.34 MIL/uL (ref 4.22–5.81)
RDW: 12.8 % (ref 11.5–15.5)
WBC: 5.7 10*3/uL (ref 4.0–10.5)

## 2017-08-12 LAB — GLUCOSE, CAPILLARY
Glucose-Capillary: 128 mg/dL — ABNORMAL HIGH (ref 65–99)
Glucose-Capillary: 134 mg/dL — ABNORMAL HIGH (ref 65–99)

## 2017-08-12 SURGERY — VITRECTOMY, USING 25-GAUGE INSTRUMENTS, WITH SCLERAL BUCKLING
Anesthesia: General | Site: Eye | Laterality: Left

## 2017-08-12 MED ORDER — PROPOFOL 10 MG/ML IV BOLUS
INTRAVENOUS | Status: AC
  Filled 2017-08-12: qty 20

## 2017-08-12 MED ORDER — 0.9 % SODIUM CHLORIDE (POUR BTL) OPTIME
TOPICAL | Status: DC | PRN
  Administered 2017-08-12: 500 mL

## 2017-08-12 MED ORDER — NA CHONDROIT SULF-NA HYALURON 40-30 MG/ML IO SOLN
INTRAOCULAR | Status: AC
  Filled 2017-08-12: qty 0.5

## 2017-08-12 MED ORDER — ATROPINE SULFATE 1 % OP SOLN
1.0000 [drp] | OPHTHALMIC | Status: AC | PRN
  Administered 2017-08-12 (×3): 1 [drp] via OPHTHALMIC
  Filled 2017-08-12: qty 5

## 2017-08-12 MED ORDER — PHENYLEPHRINE HCL 2.5 % OP SOLN
1.0000 [drp] | OPHTHALMIC | Status: AC | PRN
  Administered 2017-08-12 (×3): 1 [drp] via OPHTHALMIC
  Filled 2017-08-12: qty 2

## 2017-08-12 MED ORDER — POLYMYXIN B SULFATE 500000 UNITS IJ SOLR
INTRAMUSCULAR | Status: AC
  Filled 2017-08-12: qty 500000

## 2017-08-12 MED ORDER — NA CHONDROIT SULF-NA HYALURON 40-30 MG/ML IO SOLN
INTRAOCULAR | Status: DC | PRN
  Administered 2017-08-12: 0.5 mL via INTRAOCULAR

## 2017-08-12 MED ORDER — EPINEPHRINE PF 1 MG/ML IJ SOLN
INTRAMUSCULAR | Status: DC | PRN
  Administered 2017-08-12: .3 mg

## 2017-08-12 MED ORDER — LACTATED RINGERS IV SOLN: INTRAVENOUS | Status: DC | PRN

## 2017-08-12 MED ORDER — ONDANSETRON HCL 4 MG/2ML IJ SOLN
INTRAMUSCULAR | Status: DC | PRN
  Administered 2017-08-12: 4 mg via INTRAVENOUS

## 2017-08-12 MED ORDER — FENTANYL CITRATE (PF) 250 MCG/5ML IJ SOLN
INTRAMUSCULAR | Status: DC | PRN
  Administered 2017-08-12: 50 ug via INTRAVENOUS
  Administered 2017-08-12: 100 ug via INTRAVENOUS
  Administered 2017-08-12 (×2): 50 ug via INTRAVENOUS

## 2017-08-12 MED ORDER — SODIUM CHLORIDE 0.9 % IV SOLN
INTRAVENOUS | Status: DC
  Administered 2017-08-12 (×2): via INTRAVENOUS

## 2017-08-12 MED ORDER — BUPIVACAINE HCL (PF) 0.75 % IJ SOLN
INTRAMUSCULAR | Status: DC | PRN
  Administered 2017-08-12: 5 mL

## 2017-08-12 MED ORDER — ONDANSETRON HCL 4 MG/2ML IJ SOLN: 4.0000 mg | Freq: Once | INTRAMUSCULAR | Status: DC | PRN

## 2017-08-12 MED ORDER — TROPICAMIDE 1 % OP SOLN
1.0000 [drp] | OPHTHALMIC | Status: AC | PRN
  Administered 2017-08-12 (×3): 1 [drp] via OPHTHALMIC
  Filled 2017-08-12: qty 15

## 2017-08-12 MED ORDER — MIDAZOLAM HCL 2 MG/2ML IJ SOLN
INTRAMUSCULAR | Status: AC
  Filled 2017-08-12: qty 2

## 2017-08-12 MED ORDER — ROCURONIUM BROMIDE 10 MG/ML (PF) SYRINGE
PREFILLED_SYRINGE | INTRAVENOUS | Status: DC | PRN
  Administered 2017-08-12: 10 mg via INTRAVENOUS
  Administered 2017-08-12 (×3): 20 mg via INTRAVENOUS
  Administered 2017-08-12: 50 mg via INTRAVENOUS
  Administered 2017-08-12: 20 mg via INTRAVENOUS

## 2017-08-12 MED ORDER — TRIAMCINOLONE ACETONIDE 40 MG/ML IJ SUSP
INTRAMUSCULAR | Status: DC | PRN
  Administered 2017-08-12: 40 mg

## 2017-08-12 MED ORDER — HYDROMORPHONE HCL 2 MG/ML IJ SOLN: 0.2500 mg | INTRAMUSCULAR | Status: DC | PRN

## 2017-08-12 MED ORDER — FENTANYL CITRATE (PF) 250 MCG/5ML IJ SOLN
INTRAMUSCULAR | Status: AC
  Filled 2017-08-12: qty 5

## 2017-08-12 MED ORDER — MIDAZOLAM HCL 2 MG/2ML IJ SOLN
INTRAMUSCULAR | Status: DC | PRN
  Administered 2017-08-12: 2 mg via INTRAVENOUS

## 2017-08-12 MED ORDER — HYDROCODONE-ACETAMINOPHEN 5-325 MG PO TABS: 1.0000 | ORAL_TABLET | ORAL | 0 refills | Status: DC | PRN

## 2017-08-12 MED ORDER — BSS PLUS IO SOLN
INTRAOCULAR | Status: DC | PRN
  Administered 2017-08-12: 10 via INTRAOCULAR

## 2017-08-12 MED ORDER — STERILE WATER FOR INJECTION IJ SOLN
INTRAMUSCULAR | Status: DC | PRN
  Administered 2017-08-12: 20 mL

## 2017-08-12 MED ORDER — HYDROCODONE-ACETAMINOPHEN 5-325 MG PO TABS
1.0000 | ORAL_TABLET | Freq: Once | ORAL | Status: AC
  Administered 2017-08-12: 1 via ORAL

## 2017-08-12 MED ORDER — PROPARACAINE HCL 0.5 % OP SOLN
1.0000 [drp] | OPHTHALMIC | Status: AC | PRN
  Administered 2017-08-12 (×3): 1 [drp] via OPHTHALMIC
  Filled 2017-08-12: qty 15

## 2017-08-12 MED ORDER — PROPOFOL 10 MG/ML IV BOLUS
INTRAVENOUS | Status: DC | PRN
  Administered 2017-08-12: 150 mg via INTRAVENOUS

## 2017-08-12 MED ORDER — LIDOCAINE HCL (PF) 2 % IJ SOLN
INTRAMUSCULAR | Status: DC | PRN
  Administered 2017-08-12: 5 mL

## 2017-08-12 MED ORDER — MEPERIDINE HCL 50 MG/ML IJ SOLN: 6.2500 mg | INTRAMUSCULAR | Status: DC | PRN

## 2017-08-12 MED ORDER — LIDOCAINE 2% (20 MG/ML) 5 ML SYRINGE
INTRAMUSCULAR | Status: DC | PRN
  Administered 2017-08-12: 50 mg via INTRAVENOUS

## 2017-08-12 SURGICAL SUPPLY — 71 items
APL SRG 3 HI ABS STRL LF PLS (MISCELLANEOUS) ×8
APPLICATOR DR MATTHEWS STRL (MISCELLANEOUS) ×24 IMPLANT
BAND SCLERAL BUCKLING TYPE 40 (Ophthalmic Related) IMPLANT
BAND SCLERAL BUCKLING TYPE 41 (Ophthalmic Related) ×2 IMPLANT
BANDAGE EYE OVAL (MISCELLANEOUS) IMPLANT
BLADE EYE CATARACT 19 1.4 BEAV (BLADE) IMPLANT
BLADE MVR KNIFE 19G (BLADE) IMPLANT
CABLE BIPOLOR RESECTION CORD (MISCELLANEOUS) IMPLANT
CANNULA DUAL BORE 23G (CANNULA) IMPLANT
CANNULA DUALBORE 25G (CANNULA) ×2 IMPLANT
CANNULA FLEX TIP 25G (CANNULA) ×5 IMPLANT
CANNULA VLV SOFT TIP 25G (OPHTHALMIC) IMPLANT
CANNULA VLV SOFT TIP 25GA (OPHTHALMIC) IMPLANT
COVER SURGICAL LIGHT HANDLE (MISCELLANEOUS) ×3 IMPLANT
DRAPE MICROSCOPE LEICA 46X105 (MISCELLANEOUS) ×3 IMPLANT
ERASER HMR WETFIELD 23G BP (MISCELLANEOUS) IMPLANT
FILTER BLUE MILLIPORE (MISCELLANEOUS) IMPLANT
FILTER STRAW FLUID ASPIR (MISCELLANEOUS) IMPLANT
FORCEPS GRIESHABER ILM 25G A (INSTRUMENTS) IMPLANT
GAS AUTO FILL CONSTEL (OPHTHALMIC)
GAS AUTO FILL CONSTELLATION (OPHTHALMIC) IMPLANT
GLOVE BIOGEL M 7.0 STRL (GLOVE) ×3 IMPLANT
GLOVE SURG SS PI 6.5 STRL IVOR (GLOVE) ×6 IMPLANT
GOWN STRL REUS W/ TWL LRG LVL3 (GOWN DISPOSABLE) ×2 IMPLANT
GOWN STRL REUS W/ TWL XL LVL3 (GOWN DISPOSABLE) ×1 IMPLANT
GOWN STRL REUS W/TWL LRG LVL3 (GOWN DISPOSABLE) ×6
GOWN STRL REUS W/TWL XL LVL3 (GOWN DISPOSABLE) ×3
KIT BASIN OR (CUSTOM PROCEDURE TRAY) ×3 IMPLANT
KIT PERFLUORON PROCEDURE 5ML (MISCELLANEOUS) ×2 IMPLANT
KNIFE CRESCENT 1.75 EDGEAHEAD (BLADE) IMPLANT
KNIFE GRIESHABER SHARP 2.5MM (MISCELLANEOUS) ×3 IMPLANT
LENS BIOM SUPER VIEW SET DISP (OPHTHALMIC RELATED) IMPLANT
LENS VITRECTOMY FLAT OCLR DISP (MISCELLANEOUS) IMPLANT
MICROPICK 25G (MISCELLANEOUS)
NDL 18GX1X1/2 (RX/OR ONLY) (NEEDLE) ×1 IMPLANT
NDL 25GX 5/8IN NON SAFETY (NEEDLE) ×4 IMPLANT
NDL HYPO 30X.5 LL (NEEDLE) ×2 IMPLANT
NEEDLE 18GX1X1/2 (RX/OR ONLY) (NEEDLE) ×3 IMPLANT
NEEDLE 25GX 5/8IN NON SAFETY (NEEDLE) ×15 IMPLANT
NEEDLE HYPO 30X.5 LL (NEEDLE) ×6 IMPLANT
NS IRRIG 1000ML POUR BTL (IV SOLUTION) ×3 IMPLANT
PACK VITRECTOMY CUSTOM (CUSTOM PROCEDURE TRAY) ×3 IMPLANT
PAD ARMBOARD 7.5X6 YLW CONV (MISCELLANEOUS) ×6 IMPLANT
PAK PIK VITRECTOMY CVS 25GA (OPHTHALMIC) ×5 IMPLANT
PIC ILLUMINATED 25G (OPHTHALMIC)
PICK MICROPICK 25G (MISCELLANEOUS) IMPLANT
PIK ILLUMINATED 25G (OPHTHALMIC) IMPLANT
PROBE DIATHERMY DSP 27GA (MISCELLANEOUS) IMPLANT
PROBE ENDO DIATHERMY 25G (MISCELLANEOUS) IMPLANT
PROBE LASER ILLUM FLEX CVD 25G (OPHTHALMIC) IMPLANT
REPL STRA BRUSH NDL (NEEDLE) IMPLANT
REPL STRA BRUSH NEEDLE (NEEDLE) IMPLANT
RESERVOIR BACK FLUSH (MISCELLANEOUS) IMPLANT
ROLLS DENTAL (MISCELLANEOUS) ×6 IMPLANT
SCRAPER DIAMOND 25GA (OPHTHALMIC RELATED) IMPLANT
SUT ETHILON 5.0 S-24 (SUTURE) ×3 IMPLANT
SUT ETHILON 9 0 TG140 8 (SUTURE) IMPLANT
SUT SILK 2 0 (SUTURE) ×3
SUT SILK 2 0 TIES 17X18 (SUTURE) ×3
SUT SILK 2-0 18XBRD TIE 12 (SUTURE) ×1 IMPLANT
SUT SILK 2-0 18XBRD TIE BLK (SUTURE) ×1 IMPLANT
SUT VICRYL 7 0 TG140 8 (SUTURE) ×3 IMPLANT
SYR 10ML LL (SYRINGE) ×3 IMPLANT
SYR 20CC LL (SYRINGE) ×3 IMPLANT
SYR 5ML LL (SYRINGE) ×3 IMPLANT
SYR BULB 3OZ (MISCELLANEOUS) ×3 IMPLANT
SYR TB 1ML LUER SLIP (SYRINGE) ×3 IMPLANT
TOWEL NATURAL 6PK STERILE (DISPOSABLE) ×3 IMPLANT
TRAY FOLEY CATH 14FR (SET/KITS/TRAYS/PACK) ×3 IMPLANT
TUBING HIGH PRESS EXTEN 6IN (TUBING) IMPLANT
WATER STERILE IRR 1000ML POUR (IV SOLUTION) ×3 IMPLANT

## 2017-08-12 NOTE — Transfer of Care (Signed)
Immediate Anesthesia Transfer of Care Note  Patient: Jeffery George  Procedure(s) Performed: VITRECTOMY 25 GAUGE WITH SCLERAL BUCKLE WITH ENDOLASER (Left Eye) C3F8 GAS/FLUID EXCHANGE (Left Eye)  Patient Location: PACU  Anesthesia Type:General  Level of Consciousness: awake, alert  and oriented  Airway & Oxygen Therapy: Patient Spontanous Breathing and Patient connected to nasal cannula oxygen  Post-op Assessment: Report given to RN and Post -op Vital signs reviewed and stable  Post vital signs: Reviewed and stable  Last Vitals:  Vitals Value Taken Time  BP 142/61 08/12/2017  3:58 PM  Temp    Pulse 70 08/12/2017  3:58 PM  Resp 11 08/12/2017  3:58 PM  SpO2 94 % 08/12/2017  3:58 PM  Vitals shown include unvalidated device data.  Last Pain:  Vitals:   08/12/17 1029  TempSrc: Oral         Complications: No apparent anesthesia complications

## 2017-08-12 NOTE — Anesthesia Postprocedure Evaluation (Signed)
Anesthesia Post Note  Patient: Jeffery George  Procedure(s) Performed: VITRECTOMY 25 GAUGE WITH SCLERAL BUCKLE WITH ENDOLASER (Left Eye) C3F8 GAS/FLUID EXCHANGE (Left Eye)     Patient location during evaluation: PACU Anesthesia Type: General Level of consciousness: awake and alert Pain management: pain level controlled Vital Signs Assessment: post-procedure vital signs reviewed and stable Respiratory status: spontaneous breathing, nonlabored ventilation, respiratory function stable and patient connected to nasal cannula oxygen Cardiovascular status: blood pressure returned to baseline and stable Postop Assessment: no apparent nausea or vomiting Anesthetic complications: no    Last Vitals:  Vitals:   08/12/17 1627 08/12/17 1645  BP: (!) 145/65 (!) 156/77  Pulse: 74 76  Resp: 12 12  Temp:  37.4 C  SpO2: 93% 93%    Last Pain:  Vitals:   08/12/17 1029  TempSrc: Oral                 Michalla Ringer COKER

## 2017-08-12 NOTE — Anesthesia Procedure Notes (Addendum)
Procedure Name: Intubation Date/Time: 08/12/2017 12:31 PM Performed by: Bryson Corona, CRNA Pre-anesthesia Checklist: Patient identified, Emergency Drugs available, Suction available and Patient being monitored Patient Re-evaluated:Patient Re-evaluated prior to induction Oxygen Delivery Method: Circle System Utilized Preoxygenation: Pre-oxygenation with 100% oxygen Induction Type: IV induction Ventilation: Oral airway inserted - appropriate to patient size Laryngoscope Size: Sabra Heck and 2 Grade View: Grade II Tube type: Oral Tube size: 7.0 mm Number of attempts: 2 Airway Equipment and Method: Stylet,  Oral airway and Bougie stylet Placement Confirmation: ETT inserted through vocal cords under direct vision,  positive ETCO2 and breath sounds checked- equal and bilateral Secured at: 22 cm Tube secured with: Tape Dental Injury: Teeth and Oropharynx as per pre-operative assessment  Difficulty Due To: Difficult Airway- due to dentition and Difficult Airway- due to anterior larynx Comments: Grade 3 view with MAC 3. Grade 2b view with miller 2. ETT placed with bougie. Pt has large overbite with prominent front teeth.

## 2017-08-12 NOTE — Brief Op Note (Signed)
08/12/2017  4:00 PM  PATIENT:  Jeffery George  72 y.o. male  PRE-OPERATIVE DIAGNOSIS:  retinal detachment left eye  POST-OPERATIVE DIAGNOSIS:  Retinal detachment left eye  PROCEDURE:  Procedure(s): VITRECTOMY 25 GAUGE WITH SCLERAL BUCKLE WITH ENDOLASER and C3F8 GAS (Left)  SURGEON:  Surgeon(s) and Role:    Bernarda Caffey, MD - Primary  ASSISTANTS: Catha Brow, COA   ANESTHESIA:   local and general  EBL:  10 mL   BLOOD ADMINISTERED:none  DRAINS: none   LOCAL MEDICATIONS USED:  BUPIVICAINE  and LIDOCAINE   SPECIMEN:  No Specimen  DISPOSITION OF SPECIMEN:  N/A  COUNTS:  YES  TOURNIQUET:  * No tourniquets in log *  DICTATION: .Note written in EPIC  PLAN OF CARE: Discharge to home after PACU  PATIENT DISPOSITION:  PACU - hemodynamically stable.   Delay start of Pharmacological VTE agent (>24hrs) due to surgical blood loss or risk of bleeding: not applicable

## 2017-08-12 NOTE — Interval H&P Note (Signed)
History and Physical Interval Note:  08/12/2017 12:15 PM  Jeffery George  has presented today for surgery, with the diagnosis of retinal detachment left eye  The various methods of treatment have been discussed with the patient and family. After consideration of risks, benefits and other options for treatment, the patient has consented to  Procedure(s): VITRECTOMY 25 GAUGE WITH SCLERAL BUCKLE WITH LASER (Left) as a surgical intervention .  The patient's history has been reviewed, patient examined, no change in status, stable for surgery.  I have reviewed the patient's chart and labs.  Questions were answered to the patient's satisfaction.     Bernarda Caffey

## 2017-08-12 NOTE — Discharge Instructions (Addendum)
POSTOPERATIVE INSTRUCTIONS ° °Your doctor has performed vitreoretinal surgery on you at [facility]. ° °- Keep eye patched and shielded until seen by Dr. Zamora 845 AM tomorrow in clinic °- Do not use drops until return °- FACE DOWN POSITIONING WHILE AWAKE °- Sleep with belly down or on right side, avoid laying flat on back.   ° °- No strenuous bending, stooping or lifting. ° °- You may not drive until further notice. ° °- If your doctor used a gas bubble in your eye during the procedure he will advise you on postoperative positioning. If you have a gas bubble you will be wearing a green bracelet that was applied in the operating room. The green bracelet should stay on as long as the gas bubble is in your eye. While the gas bubble is present you should not fly in an airplane. If you require general anesthesia while the gas bubble is present you must notify your anesthesiologist that an intraocular gas bubble is present so he can take the appropriate precautions. ° °- Tylenol or any other over-the-counter pain reliever can be used according to your doctor. If more pain medicine is required, your doctor will have a prescription for you. ° °- You may read, go up and down stairs, and watch television. ° ° ° ° Brian Zamora, M.D., Ph.D. ° °

## 2017-08-12 NOTE — Progress Notes (Signed)
[facility] Note  08/13/2017     CHIEF COMPLAINT Patient presents for Post-op Follow-up   HISTORY OF PRESENT ILLNESS: Jeffery George is a 72 y.o. male who presents to the clinic today for:   HPI    Post-op Follow-up    In left eye.  Discomfort includes pain and foreign body sensation.  I, the attending physician,  performed the HPI with the patient and updated documentation appropriately.          Comments    Pt presents today for first POV, pt has scleral buckle sx yesterday, states he had a hard time with the face down position last night, but was able to sleep like that, pt states OD hurt yesterday afterwards as well, pt states OS is painful this AM, pt states VA OD is not as good as before sx       Last edited by Bernarda Caffey, MD on 08/13/2017 11:28 AM. (History)      Referring physician: [name], MD [place], [place] [id number]  HISTORICAL INFORMATION:   Selected notes from the MEDICAL RECORD NUMBER Referred by Dr. Parke Simmers for possible RD OS LEE- 05.01.19  Ocular Hx- pseudophakia OU with dual I-stent (OD 02.11.19 OS 02.25.19 with Dr. Cher Nakai),  PMH- DM, heart murmur    CURRENT MEDICATIONS: Current Outpatient Medications (Ophthalmic Drugs)  Medication Sig  . latanoprost (XALATAN) 0.005 % ophthalmic solution Place 1 drop into both eyes every evening.    No current facility-administered medications for this visit.  (Ophthalmic Drugs)   Current Outpatient Medications (Other)  Medication Sig  . ACCU-CHEK SOFTCLIX LANCETS lancets Test blood sugar once daily. Dx E11.9  . amLODipine (NORVASC) 5 MG tablet TAKE 1 TABLET DAILY  . aspirin EC 81 MG tablet Take 1 tablet (81 mg total) by mouth daily.  Marland Kitchen atorvastatin (LIPITOR) 10 MG tablet Take 1 tablet (10 mg total) by mouth daily.  . blood glucose meter kit and supplies TrueMetrix, TruTrack, Accu-Chek Aviva Expert, Accu-Chek Aviva Plus, Accu-Chek Guide, Accu-Chek Nano, or  Accu-Chek Smartview.   Use once per day.  . Blood Glucose Monitoring Suppl (BLOOD GLUCOSE MONITOR KIT) KIT Use to test blood sugar twice daily.  . colchicine (COLCRYS) 0.6 MG tablet Take 2 tabs po at symptom onset. Repeat 1 tab po 1 hour later.  Marland Kitchen glucose blood (ONE TOUCH ULTRA TEST) test strip 1 each by Other route daily. Use as instructed  . HYDROcodone-acetaminophen (NORCO/VICODIN) 5-325 MG tablet Take 1 tablet by mouth every 4 (four) hours as needed for moderate pain.  . indomethacin (INDOCIN) 50 MG capsule TAKE 1 CAPSULE THREE TIMES A DAY AS NEEDED FOR MODERATE PAIN  . Lancets (ONETOUCH ULTRASOFT) lancets Use as instructed  . losartan (COZAAR) 100 MG tablet Take 1 tablet (100 mg total) by mouth daily.  . metFORMIN (GLUCOPHAGE) 500 MG tablet Take 1 tablet (500 mg total) by mouth daily with breakfast.  . Omega-3 Fatty Acids (FISH OIL) 1000 MG CAPS Take by mouth daily.    No current facility-administered medications for this visit.  (Other)      REVIEW OF SYSTEMS: ROS    Positive for: Musculoskeletal, Endocrine, Eyes   Negative for: Constitutional, Gastrointestinal, Neurological, Skin, Genitourinary, HENT, Cardiovascular, Respiratory, Psychiatric, Allergic/Imm, Heme/Lymph   Last edited by Debbrah Alar, COT on 08/13/2017  9:13 AM. (History)       ALLERGIES No Known Allergies  PAST MEDICAL HISTORY Past Medical History:  Diagnosis Date  .  Arthritis   . Diabetes mellitus without complication ([place])   . Heart murmur   . Hypertension    Past Surgical History:  Procedure Laterality Date  . CATARACT EXTRACTION    . FRACTURE SURGERY     Lt ankle  . HERNIA REPAIR    . IRIDOTOMY / IRIDECTOMY Bilateral    stents  . VASECTOMY      FAMILY HISTORY Family History  Problem Relation Age of Onset  . Cancer Mother   . Diabetes Mother   . Stroke Mother   . Heart disease Father   . Stroke Father   . Diabetes Brother   . Heart disease Brother   . Diabetes Brother   . Colon  cancer Neg Hx     SOCIAL HISTORY Social History   Tobacco Use  . Smoking status: Former Research scientist (life sciences)  . Smokeless tobacco: Never Used  Substance Use Topics  . Alcohol use: No    Alcohol/week: 0.0 oz  . Drug use: No         OPHTHALMIC EXAM:  Base Eye Exam    Visual Acuity (Snellen - Linear)      Right Left   Dist [place] 20/30 -2 HM   Dist ph [place] 20/20 -2 NI       Tonometry (Tonopen, 9:13 AM)      Right Left   Pressure 19 24       Pupils      Dark Light Shape React APD   Right 5 3 Round Brisk None   Left 5 5 Round NR None       Visual Fields (Counting fingers)      Left Right     Full   Restrictions Total superior temporal, inferior temporal, superior nasal, inferior nasal deficiencies        Neuro/Psych    Oriented x3:  Yes   Mood/Affect:  Normal       Dilation    Left eye:  1.0% Mydriacyl, 2.5% Phenylephrine @ 9:17 AM        Slit Lamp and Fundus Exam    Slit Lamp Exam      Right Left   Lids/Lashes Dermatochalasis - upper lid Dermatochalasis - upper lid, peri orbital edema, Ecchymosis   Conjunctiva/Sclera White and quiet Subconjunctival hemorrhage, sutures intact   Cornea Arcus, Well healed cataract wounds Arcus, Well healed cataract wounds, central epi defect, microcystic edema   Anterior Chamber Deep and quiet, stent at 0230 and 0300 angle  Deep and quiet   Iris Round and dilated to 7.37m Round and dilated to 839m  Lens PC IOL in good position, trace Posterior capsular opacification PC IOL in good position   Vitreous Vitreous syneresis Post vitrectomy, good gas fill       Fundus Exam      Right Left   Disc  360 Peripapillary atrophy, +cupping, perfussed   C/D Ratio 0.6 0.6   Macula  flat under gas   Vessels  Mild Vascular attenuation   Periphery  Retina attached; good buckle height, great laser in place 360 on buckle and just posterior to buckle          IMAGING AND PROCEDURES  Imaging and Procedures for 08/13/17            ASSESSMENT/PLAN:    ICD-10-CM   1. Left retinal detachment H33.22   2. Retinal edema H35.81   3. Pseudophakia of both eyes Z96.1   4. Primary open angle glaucoma (POAG) of both  eyes, mild stage H40.1131     1. Retinal detachment, OS-  - bullous superior detachment from 1030 to 0230, fovea splitting - small breaks ST quad within patch of thin retina - POD1 s/p SBP + PPV/PFC/EL/FAX/14% C3F8 OS, 05.02.19             - doing well this morning             - retina attached and in good position -- good buckle height and laser around breaks             - IOP 24 today             - start   PF 4x/day OS                         zymaxid QID OS                         Atropine BID OS   Brimonidine BID OS                         Cosopt BID OS                         PSO ung QID OS             - cont face down positioning x3 days; avoid laying flat on back             - eye shield when sleeping             - post op drop and positioning instructions reviewed             - tylenol/ibuprofen for pain             - Rx given for breakthrough pain - F/U 1 wk  2. Retinal edema - mild IRF within detached retina on initial evaluation  3. Pseudophakia OU  - s/p CE/IOL w/ iStent OU by Dr. Cher Nakai  - beautiful surgeries, doing well  - monitor  4. POAG OU - s/p iStents OU as above - IOP okay today - under the expert management of Dr. Cher Nakai   Ophthalmic Meds Ordered this visit:  No orders of the defined types were placed in this encounter.      Return in about 1 week (around 08/20/2017) for POV2.  There are no Patient Instructions on file for this visit.   Explained the diagnoses, plan, and follow up with the patient and they expressed understanding.  Patient expressed understanding of the importance of proper follow up care.   This document serves as a record of services personally performed by Gardiner Sleeper, MD, PhD. It was created on their behalf by Catha Brow, [place],  a certified ophthalmic assistant. The creation of this record is the provider's dictation and/or activities during the visit.  Electronically signed by: Catha Brow, [place]  08/13/17 11:31 AM   Gardiner Sleeper, M.D., Ph.D. Diseases & Surgery of the Retina and [place] 08/13/17  I have reviewed the above documentation for accuracy and completeness, and I agree with the above. Gardiner Sleeper, M.D., Ph.D. 08/13/17 11:31 AM    Abbreviations: M myopia (nearsighted); A astigmatism; H hyperopia (farsighted); P presbyopia; Mrx spectacle prescription;  CTL contact lenses; OD right eye; OS left eye; OU both eyes  XT exotropia; ET esotropia; PEK punctate epithelial keratitis; PEE punctate epithelial erosions; DES dry eye syndrome; MGD meibomian gland dysfunction; ATs artificial tears; PFAT's preservative free artificial tears; [place] nuclear sclerotic cataract; PSC posterior subcapsular cataract; ERM epi-retinal membrane; PVD posterior vitreous detachment; RD retinal detachment; DM diabetes mellitus; DR diabetic retinopathy; NPDR non-proliferative diabetic retinopathy; PDR proliferative diabetic retinopathy; CSME clinically significant macular edema; DME diabetic macular edema; dbh dot blot hemorrhages; CWS cotton wool spot; POAG primary open angle glaucoma; C/D cup-to-disc ratio; HVF humphrey visual field; GVF goldmann visual field; OCT optical coherence tomography; IOP intraocular pressure; BRVO Branch retinal vein occlusion; CRVO central retinal vein occlusion; CRAO central retinal artery occlusion; BRAO branch retinal artery occlusion; RT retinal tear; SB scleral buckle; PPV pars plana vitrectomy; VH Vitreous hemorrhage; PRP panretinal laser photocoagulation; IVK intravitreal kenalog; VMT vitreomacular traction; MH Macular hole;  NVD neovascularization of the disc; NVE neovascularization elsewhere; AREDS age related eye disease study; ARMD age related macular degeneration;  POAG primary open angle glaucoma; EBMD epithelial/anterior basement membrane dystrophy; ACIOL anterior chamber intraocular lens; IOL intraocular lens; PCIOL posterior chamber intraocular lens; Phaco/IOL phacoemulsification with intraocular lens placement; [place] photorefractive keratectomy; LASIK laser assisted in situ keratomileusis; HTN hypertension; DM diabetes mellitus; COPD chronic obstructive pulmonary disease

## 2017-08-12 NOTE — Anesthesia Preprocedure Evaluation (Signed)
Anesthesia Evaluation  Patient identified by MRN, date of birth, ID band Patient awake    Reviewed: Allergy & Precautions, NPO status , Patient's Chart, lab work & pertinent test results  Airway Mallampati: III  TM Distance: >3 FB Neck ROM: Full    Dental  (+) Teeth Intact, Dental Advisory Given   Pulmonary former smoker,    breath sounds clear to auscultation       Cardiovascular hypertension,  Rhythm:Regular Rate:Normal     Neuro/Psych    GI/Hepatic   Endo/Other  diabetes  Renal/GU      Musculoskeletal   Abdominal (+) + obese,   Peds  Hematology   Anesthesia Other Findings   Reproductive/Obstetrics                             Anesthesia Physical Anesthesia Plan  ASA: II  Anesthesia Plan: General   Post-op Pain Management:    Induction: Intravenous  PONV Risk Score and Plan: Ondansetron and Midazolam  Airway Management Planned: Oral ETT  Additional Equipment:   Intra-op Plan:   Post-operative Plan: Extubation in OR  Informed Consent: I have reviewed the patients History and Physical, chart, labs and discussed the procedure including the risks, benefits and alternatives for the proposed anesthesia with the patient or authorized representative who has indicated his/her understanding and acceptance.   Dental advisory given  Plan Discussed with: CRNA and Anesthesiologist  Anesthesia Plan Comments: (Retinal Detachment L. Eye Type 2 DM without complications glucose 366 htn  Plan GA with oral ETT)        Anesthesia Quick Evaluation

## 2017-08-12 NOTE — Op Note (Signed)
Date of procedure: 5.2.19  Surgeon: Bernarda Caffey, MD, PhD  Assistant:Meredith Quentin Cornwall, COA  Pre-operative Diagnosis: MaculainvolvingRetinal Detachment, Left Eye  Post-operative diagnosis: MaculainvolvingRetinal Detachment, LeftEye  Anesthesia: GETA  Procedures: 1) Scleral Buckle, Left Eye 2) 25gauge pars plana vitrectomy,Left Eye CPT [phone] 3) Perfluorocarbon injection 4)Fluid-air exchange, LeftEye 5) Endolaser, LeftEye 5) Injection [id number]  Complications: none Estimated blood loss: minimal Specimens: none  Brief history: The patient has a history of decreased vision in the affected left eye, and on examination, was noted to have a macula-involving retinal detachment, affecting activities of daily living.The risks, benefits, and alternatives were explained to the patient, including pain, bleeding, infection, loss of vision, double vision, droopy eyelids, and need for more surgeries.Informed consent was obtained from the patient and placed in the chart.   Procedure: The patient was brought to the preoperative holding area where the correct eye wasconfirmed and marked.The patient was then brought to the operating room where general endotracheal anesthesia was induced. A secondary time-out was performed to identify the correct patient, eyes, procedures, and any allergies.The left eye was prepped and draped in the usual sterile ophthalmic fashion followed by placement of a lid speculum. A 360 conjunctival peritomy was created using Westcott scissors and 0.12 forceps. Each of the four quadrants between the rectus muscles was dissected using Stevens scissors to detach Tenon's attachments from the globe. Each of the four rectus muscles was isolated on a muscle hook and slung using 2-0 Silk suture in the usual standard fashion. Each of the four quadrants between the rectus muscles was inspected and scleral  thinning was noted in the superotemporal quadrant. A #25 silicone band was then brought onto the field and was threaded under each rectus muscle. The band was thenlooselysecured using a #70 Watzke sleeve in the superotemporalquadrant. The band was then sutured to the sclera in each quadrant using 5-0 nylon sutures passed partial thickness through the sclera in a horizontal mattress fashion. The scleral buckle was thentightened to the appropriate height with two locking needle drivers. Attention was then turned to the vitrectomy portion of the procedure. A 25gauge trocar was placed in the inferotemporal quadrant in a beveled fashion. A 4 mm infusion cannula was placed through this trocar, and the infusion cannula was confirmed in the vitreous cavity with no incarceration of retina or choroid prior to turning it on. Two additional 25gauge trocars were placed in the superonasal and superotemporal quadrants(2 and 10 oclock, respectively)in a similar beveled fashion. At this time,a standard three-port pars plana vitrectomy was performed using the light pipe, the cutter, and the BIOM viewing system. A thoroughanterior,core and peripheral vitreous dissection was performed. A posterior vitreous detachment was confirmed over the optic nerve.There was a superior retinal detachment from 1030 to 0230involving the macula and splitting the fovea. On careful inspection there was were multiple microtears in the superior/superotemporal quadrants within thin atrophic retina. Of note there was an un-associated horseshoe tear without subretinal fluid at 0300. Traction was removed from all retinal breaks. The breaks weretrimmed using the cutter to smooth the edges.Perfluoron was injected to push the subretinal fluid anterior to the scleral buckle. A complete fluid-air exchange was performed with a soft tip extrusion cannula over a break at 1030 break, then posteriorly to remove the perfluoron.  After completion of these maneuvers, the retina was flat over the macula and over the scleral buckle. Under air, endolaser was applied to all the breaks and over and posterior to the scleral buckle. At this time, the buckle height  was confirmed and the buckle was finalized by trimming the band ends.The superonasal trocar wasremoved and sutured with 7-0 vicryl in an interrupted fashion.A complete air to14%[id number] exchange was performed through the infusion cannula and vented through the superotemporal trocar using the extrusion cannula.Thesuperonasal trocar andinfusion cannula and associated trocar werethen removed and sutured with 7-0 vicryl in an interrupted fashion. Kefzol+ polymixinirrigation wasthenused over the buckle. A subtenon's block containing 0.75% marcaine and 2% lidocaine was administered. The conjunctiva was closed with 7-0 vicryl sutures. The eye's intraocular pressurewas confirmed to be at a physiologic level by digital palpation. Subconjunctival injections of Antibiotic and kenalogwere administered. The lid speculum and drapes were removed. Drops of an antibiotic, antihypertensives, and steroid were given. Copious antibiotic ointment was instilled into the eye. The eye was patched and shielded. The patient tolerated the procedure well without any intraoperative or immediate postoperative complications. The patient was taken to the recovery room in good condition. The patient was instructed to maintain a strict face-down position andwill be seen by Dr. Baltazar Najjar in clinic.

## 2017-08-13 ENCOUNTER — Ambulatory Visit (INDEPENDENT_AMBULATORY_CARE_PROVIDER_SITE_OTHER): Payer: Medicare HMO | Admitting: Ophthalmology

## 2017-08-13 ENCOUNTER — Encounter (INDEPENDENT_AMBULATORY_CARE_PROVIDER_SITE_OTHER): Payer: Self-pay | Admitting: Ophthalmology

## 2017-08-13 DIAGNOSIS — H3581 Retinal edema: Secondary | ICD-10-CM

## 2017-08-13 DIAGNOSIS — H3322 Serous retinal detachment, left eye: Secondary | ICD-10-CM

## 2017-08-13 DIAGNOSIS — Z961 Presence of intraocular lens: Secondary | ICD-10-CM

## 2017-08-13 DIAGNOSIS — H401131 Primary open-angle glaucoma, bilateral, mild stage: Secondary | ICD-10-CM

## 2017-08-19 NOTE — Progress Notes (Signed)
[facility] Note  08/20/2017     CHIEF COMPLAINT Patient presents for Post-op Follow-up   HISTORY OF PRESENT ILLNESS: Jeffery George is a 72 y.o. male who presents to the clinic today for:   HPI    Post-op Follow-up    In left eye.  Discomfort includes pain and itching.  Negative for foreign body sensation, discharge, none, tearing and floaters.  Vision is improved.  I, the attending physician,  performed the HPI with the patient and updated documentation appropriately.          Comments    72 y/o male pt here for POV2 s/p SBP+PPV/PFC/EL/FAX/14% C3F8 OS on 05.02.2019.  Vision os gradually improving, but still very blurred.  Can still see the gas bubble in his vision os.  No change in vision od.  Has some minor pain os in the mornings, but it clears up after using gtts.  OS itches intermittently.  Denies flashes, floaters.  Latanoprost qhs ou Zymaxid qid os Atropine bid os Brimonidine bid os Cosopt bid os PSO ung qid os       Last edited by Cherrie Gauze, COA on 08/20/2017 10:30 AM. (History)      Referring physician: [name], MD [place], [place] [address]  HISTORICAL INFORMATION:   Selected notes from the MEDICAL RECORD NUMBER Referred by Dr. Parke Simmers for possible RD OS LEE- 05.01.19  Ocular Hx- pseudophakia OU with dual I-stent (OD 02.11.19 OS 02.25.19 with Dr. Cher Nakai),  PMH- DM, heart murmur    CURRENT MEDICATIONS: Current Outpatient Medications (Ophthalmic Drugs)  Medication Sig  . latanoprost (XALATAN) 0.005 % ophthalmic solution Place 1 drop into both eyes every evening.   . prednisoLONE acetate (PRED FORTE) 1 % ophthalmic suspension Place 1 drop into the left eye 4 (four) times daily.   No current facility-administered medications for this visit.  (Ophthalmic Drugs)   Current Outpatient Medications (Other)  Medication Sig  . ACCU-CHEK SOFTCLIX LANCETS lancets Test blood sugar once daily. Dx E11.9  .  amLODipine (NORVASC) 5 MG tablet TAKE 1 TABLET DAILY  . aspirin EC 81 MG tablet Take 1 tablet (81 mg total) by mouth daily.  Marland Kitchen atorvastatin (LIPITOR) 10 MG tablet Take 1 tablet (10 mg total) by mouth daily.  . blood glucose meter kit and supplies TrueMetrix, TruTrack, Accu-Chek Aviva Expert, Accu-Chek Aviva Plus, Accu-Chek Guide, Accu-Chek Nano, or Accu-Chek Smartview.   Use once per day.  . Blood Glucose Monitoring Suppl (BLOOD GLUCOSE MONITOR KIT) KIT Use to test blood sugar twice daily.  . colchicine (COLCRYS) 0.6 MG tablet Take 2 tabs po at symptom onset. Repeat 1 tab po 1 hour later.  Marland Kitchen glucose blood (ONE TOUCH ULTRA TEST) test strip 1 each by Other route daily. Use as instructed  . HYDROcodone-acetaminophen (NORCO/VICODIN) 5-325 MG tablet Take 1 tablet by mouth every 4 (four) hours as needed for moderate pain.  . indomethacin (INDOCIN) 50 MG capsule TAKE 1 CAPSULE THREE TIMES A DAY AS NEEDED FOR MODERATE PAIN  . Lancets (ONETOUCH ULTRASOFT) lancets Use as instructed  . losartan (COZAAR) 100 MG tablet Take 1 tablet (100 mg total) by mouth daily.  . metFORMIN (GLUCOPHAGE) 500 MG tablet Take 1 tablet (500 mg total) by mouth daily with breakfast.  . Omega-3 Fatty Acids (FISH OIL) 1000 MG CAPS Take by mouth daily.    No current facility-administered medications for this visit.  (Other)      REVIEW OF SYSTEMS:  ROS    Positive for: Endocrine, Cardiovascular, Eyes   Negative for: Constitutional, Gastrointestinal, Neurological, Skin, Genitourinary, Musculoskeletal, HENT, Respiratory, Psychiatric, Allergic/Imm, Heme/Lymph   Last edited by Matthew Folks, COA on 08/20/2017  9:41 AM. (History)       ALLERGIES No Known Allergies  PAST MEDICAL HISTORY Past Medical History:  Diagnosis Date  . Arthritis   . Diabetes mellitus without complication ([place])   . Heart murmur   . Hypertension    Past Surgical History:  Procedure Laterality Date  . CATARACT EXTRACTION    . FRACTURE SURGERY      Lt ankle  . GAS/FLUID EXCHANGE Left 08/12/2017   Procedure: C3F8 GAS/FLUID EXCHANGE;  Surgeon: Bernarda Caffey, MD;  Location: [facility];  Service: Ophthalmology;  Laterality: Left;  . HERNIA REPAIR    . IRIDOTOMY / IRIDECTOMY Bilateral    stents  . VASECTOMY    . VITRECTOMY 25 GAUGE WITH SCLERAL BUCKLE Left 08/12/2017   Procedure: VITRECTOMY 25 GAUGE WITH SCLERAL BUCKLE WITH ENDOLASER;  Surgeon: Bernarda Caffey, MD;  Location: [facility];  Service: Ophthalmology;  Laterality: Left;    FAMILY HISTORY Family History  Problem Relation Age of Onset  . Cancer Mother   . Diabetes Mother   . Stroke Mother   . Heart disease Father   . Stroke Father   . Diabetes Brother   . Heart disease Brother   . Diabetes Brother   . Colon cancer Neg Hx     SOCIAL HISTORY Social History   Tobacco Use  . Smoking status: Former Research scientist (life sciences)  . Smokeless tobacco: Never Used  Substance Use Topics  . Alcohol use: No    Alcohol/week: 0.0 oz  . Drug use: No         OPHTHALMIC EXAM:  Base Eye Exam    Visual Acuity (Snellen - Linear)      Right Left   Dist [place] 20/25 CF @ face   Dist ph  20/20 20/300       Tonometry (Tonopen, 9:48 AM)      Right Left   Pressure 14 17       Pupils      Dark Light Shape React APD   Right 4 3 Round Brisk None   Left 5 5 Round No   OS pharm dilated       Visual Fields (Counting fingers)      Left Right     Full   Restrictions Total superior temporal, inferior temporal, superior nasal, inferior nasal deficiencies        Extraocular Movement      Right Left    Full, Ortho Full, Ortho       Neuro/Psych    Oriented x3:  Yes   Mood/Affect:  Normal       Dilation    Left eye:  1.0% Mydriacyl, 2.5% Phenylephrine @ 9:48 AM        Slit Lamp and Fundus Exam    Slit Lamp Exam      Right Left   Lids/Lashes Dermatochalasis - upper lid Dermatochalasis - upper lid, peri orbital edema, Ecchymosis   Conjunctiva/Sclera White and quiet Subconjunctival hemorrhage,  sutures intact   Cornea Arcus, Well healed cataract wounds Arcus, Well healed cataract wounds, central epi defect, microcystic edema   Anterior Chamber Deep and quiet, stent at 0230 and 0300 angle  0.5+ Cell, deep   Iris Round and dilated to 7.24m Round and dilated to 833m  Lens PC IOL in  good position, trace Posterior capsular opacification PC IOL in good position   Vitreous Vitreous syneresis Post vitrectomy, gas bubble at 70%       Fundus Exam      Right Left   Disc  360 Peripapillary atrophy, +cupping, perfussed   C/D Ratio 0.6 0.6   Macula  flat under gas   Vessels  Mild Vascular attenuation   Periphery  Retina attached; good buckle height, great laser in place 360 on buckle and just posterior to buckle          IMAGING AND PROCEDURES  Imaging and Procedures for 08/13/17           ASSESSMENT/PLAN:    ICD-10-CM   1. Left retinal detachment H33.22   2. Retinal edema H35.81   3. Pseudophakia of both eyes Z96.1   4. Primary open angle glaucoma (POAG) of both eyes, mild stage H40.1131     1. Retinal detachment, OS-  - bullous superior detachment from 1030 to 0230, fovea splitting - small breaks ST quad within patch of thin retina - POW1 s/p SBP + PPV/PFC/EL/FAX/14% C3F8 OS, 05.02.19             - did well this week -- compliant with positioning             - retina attached and in good position -- good buckle height and laser around breaks             - IOP 17 today             - cont   PF 4x/day OS                         zymaxid QID OS -- STOP                         Atropine BID OS   Brimonidine BID OS                         Cosopt BID OS -- STOP                         PSO ung QID/PRN OS             - cont face down positioning, as much as possible; avoid laying flat on back             - eye shield when sleepingx 1 more week             - post op drop and positioning instructions reviewed             - tylenol/ibuprofen for pain             - Rx  given for breakthrough pain - F/U 2-3 wks  2. Retinal edema - mild IRF within detached retina on initial evaluation  3. Pseudophakia OU  - s/p CE/IOL w/ iStent OU by Dr. Cher Nakai  - beautiful surgeries, doing well  - monitor  4. POAG OU - s/p iStents OU as above - IOP okay today - under the expert management of Dr. Cher Nakai   Ophthalmic Meds Ordered this visit:  Meds ordered this encounter  Medications  . prednisoLONE acetate (PRED FORTE) 1 % ophthalmic suspension    Sig: Place 1 drop into the left eye 4 (four) times daily.    Dispense:  10 mL    Refill:  0       Return in about 3 weeks (around 09/10/2017) for POV 3.  There are no Patient Instructions on file for this visit.   Explained the diagnoses, plan, and follow up with the patient and they expressed understanding.  Patient expressed understanding of the importance of proper follow up care.   This document serves as a record of services personally performed by Gardiner Sleeper, MD, PhD. It was created on their behalf by Catha Brow, [place], a certified ophthalmic assistant. The creation of this record is the provider's dictation and/or activities during the visit.  Electronically signed by: Catha Brow, COA  05.09.19 1:16 PM    Gardiner Sleeper, M.D., Ph.D. Diseases & Surgery of the Retina and [place] 08/19/17  I have reviewed the above documentation for accuracy and completeness, and I agree with the above. Gardiner Sleeper, M.D., Ph.D. 08/23/17 1:17 PM     Abbreviations: M myopia (nearsighted); A astigmatism; H hyperopia (farsighted); P presbyopia; Mrx spectacle prescription;  CTL contact lenses; OD right eye; OS left eye; OU both eyes  XT exotropia; ET esotropia; PEK punctate epithelial keratitis; PEE punctate epithelial erosions; DES dry eye syndrome; MGD meibomian gland dysfunction; ATs artificial tears; PFAT's preservative free artificial tears; [place] nuclear sclerotic  cataract; PSC posterior subcapsular cataract; ERM epi-retinal membrane; PVD posterior vitreous detachment; RD retinal detachment; DM diabetes mellitus; DR diabetic retinopathy; NPDR non-proliferative diabetic retinopathy; PDR proliferative diabetic retinopathy; CSME clinically significant macular edema; DME diabetic macular edema; dbh dot blot hemorrhages; CWS cotton wool spot; POAG primary open angle glaucoma; C/D cup-to-disc ratio; HVF humphrey visual field; GVF goldmann visual field; OCT optical coherence tomography; IOP intraocular pressure; BRVO Branch retinal vein occlusion; CRVO central retinal vein occlusion; CRAO central retinal artery occlusion; BRAO branch retinal artery occlusion; RT retinal tear; SB scleral buckle; PPV pars plana vitrectomy; VH Vitreous hemorrhage; PRP panretinal laser photocoagulation; IVK intravitreal kenalog; VMT vitreomacular traction; MH Macular hole;  NVD neovascularization of the disc; NVE neovascularization elsewhere; AREDS age related eye disease study; ARMD age related macular degeneration; POAG primary open angle glaucoma; EBMD epithelial/anterior basement membrane dystrophy; ACIOL anterior chamber intraocular lens; IOL intraocular lens; PCIOL posterior chamber intraocular lens; Phaco/IOL phacoemulsification with intraocular lens placement; [place] photorefractive keratectomy; LASIK laser assisted in situ keratomileusis; HTN hypertension; DM diabetes mellitus; COPD chronic obstructive pulmonary disease

## 2017-08-20 ENCOUNTER — Encounter (INDEPENDENT_AMBULATORY_CARE_PROVIDER_SITE_OTHER): Payer: Self-pay | Admitting: Ophthalmology

## 2017-08-20 ENCOUNTER — Ambulatory Visit (INDEPENDENT_AMBULATORY_CARE_PROVIDER_SITE_OTHER): Payer: Medicare HMO | Admitting: Ophthalmology

## 2017-08-20 DIAGNOSIS — H3581 Retinal edema: Secondary | ICD-10-CM

## 2017-08-20 DIAGNOSIS — H3322 Serous retinal detachment, left eye: Secondary | ICD-10-CM

## 2017-08-20 DIAGNOSIS — Z961 Presence of intraocular lens: Secondary | ICD-10-CM

## 2017-08-20 DIAGNOSIS — H401131 Primary open-angle glaucoma, bilateral, mild stage: Secondary | ICD-10-CM

## 2017-08-20 MED ORDER — PREDNISOLONE ACETATE 1 % OP SUSP: 1.0000 [drp] | Freq: Four times a day (QID) | OPHTHALMIC | 0 refills | Status: DC

## 2017-08-23 ENCOUNTER — Encounter (INDEPENDENT_AMBULATORY_CARE_PROVIDER_SITE_OTHER): Payer: Self-pay | Admitting: Ophthalmology

## 2017-09-02 NOTE — Progress Notes (Addendum)
[facility] Note  09/07/2017     CHIEF COMPLAINT Patient presents for Post-op Follow-up   HISTORY OF PRESENT ILLNESS: Jeffery George is a 72 y.o. male who presents to the clinic today for:   HPI    Post-op Follow-up    In left eye.  Discomfort includes pain.  Negative for itching, foreign body sensation, tearing, discharge, floaters and none.  Vision is worse.  I, the attending physician,  performed the HPI with the patient and updated documentation appropriately.          Comments    S/P SBP + PPV/PFC/EL/FAX/14% C3F8 OS (05.02.19); Pt states he was healing well but reports 2 days ago OS "took a turn for the worst"; Pt states OS is red, reports feels like OS is swelling now; Pt states OS VA has gotten worse; Pt states 2 days ago OS "was staying closed"; Pt reports he is able to see bubble now; Pt reports using PF OS QID, Brimonidine OS BID, PSO ung PRN; Pt states he ran out of atropine x 2 days ago;        Last edited by Bernarda Caffey, MD on 09/07/2017 10:57 AM. (History)      Referring physician: [name], MD [place], [place] [address]  HISTORICAL INFORMATION:   Selected notes from the MEDICAL RECORD NUMBER Referred by Dr. Parke Simmers for possible RD OS LEE- 05.01.19  Ocular Hx- pseudophakia OU with dual I-stent (OD 02.11.19 OS 02.25.19 with Dr. Cher Nakai),  PMH- DM, heart murmur    CURRENT MEDICATIONS: Current Outpatient Medications (Ophthalmic Drugs)  Medication Sig  . bacitracin-polymyxin b (POLYSPORIN) ophthalmic ointment Place into the left eye 4 (four) times daily. Place a 1/2 inch ribbon of ointment into the lower eyelid.  Marland Kitchen latanoprost (XALATAN) 0.005 % ophthalmic solution Place 1 drop into both eyes every evening.   . prednisoLONE acetate (PRED FORTE) 1 % ophthalmic suspension Place 1 drop into the left eye 4 (four) times daily.   No current facility-administered medications for this visit.  (Ophthalmic Drugs)    Current Outpatient Medications (Other)  Medication Sig  . ACCU-CHEK SOFTCLIX LANCETS lancets Test blood sugar once daily. Dx E11.9  . amLODipine (NORVASC) 5 MG tablet TAKE 1 TABLET DAILY  . aspirin EC 81 MG tablet Take 1 tablet (81 mg total) by mouth daily.  Marland Kitchen atorvastatin (LIPITOR) 10 MG tablet Take 1 tablet (10 mg total) by mouth daily.  . blood glucose meter kit and supplies TrueMetrix, TruTrack, Accu-Chek Aviva Expert, Accu-Chek Aviva Plus, Accu-Chek Guide, Accu-Chek Nano, or Accu-Chek Smartview.   Use once per day.  . Blood Glucose Monitoring Suppl (BLOOD GLUCOSE MONITOR KIT) KIT Use to test blood sugar twice daily.  . colchicine (COLCRYS) 0.6 MG tablet Take 2 tabs po at symptom onset. Repeat 1 tab po 1 hour later.  Marland Kitchen glucose blood (ONE TOUCH ULTRA TEST) test strip 1 each by Other route daily. Use as instructed  . HYDROcodone-acetaminophen (NORCO/VICODIN) 5-325 MG tablet Take 1 tablet by mouth every 4 (four) hours as needed for moderate pain.  . indomethacin (INDOCIN) 50 MG capsule TAKE 1 CAPSULE THREE TIMES A DAY AS NEEDED FOR MODERATE PAIN  . Lancets (ONETOUCH ULTRASOFT) lancets Use as instructed  . losartan (COZAAR) 100 MG tablet Take 1 tablet (100 mg total) by mouth daily.  . metFORMIN (GLUCOPHAGE) 500 MG tablet Take 1 tablet (500 mg total) by mouth daily with breakfast.  . Omega-3 Fatty Acids (FISH  OIL) 1000 MG CAPS Take by mouth daily.    No current facility-administered medications for this visit.  (Other)      REVIEW OF SYSTEMS: ROS    Positive for: Endocrine, Cardiovascular, Eyes   Negative for: Constitutional, Gastrointestinal, Neurological, Skin, Genitourinary, Musculoskeletal, HENT, Respiratory, Psychiatric, Allergic/Imm, Heme/Lymph   Last edited by Cherrie Gauze, COA on 09/07/2017 10:07 AM. (History)       ALLERGIES No Known Allergies  PAST MEDICAL HISTORY Past Medical History:  Diagnosis Date  . Arthritis   . Diabetes mellitus without complication  ([place])   . Heart murmur   . Hypertension    Past Surgical History:  Procedure Laterality Date  . CATARACT EXTRACTION    . FRACTURE SURGERY     Lt ankle  . GAS/FLUID EXCHANGE Left 08/12/2017   Procedure: C3F8 GAS/FLUID EXCHANGE;  Surgeon: Bernarda Caffey, MD;  Location: [facility];  Service: Ophthalmology;  Laterality: Left;  . HERNIA REPAIR    . IRIDOTOMY / IRIDECTOMY Bilateral    stents  . VASECTOMY    . VITRECTOMY 25 GAUGE WITH SCLERAL BUCKLE Left 08/12/2017   Procedure: VITRECTOMY 25 GAUGE WITH SCLERAL BUCKLE WITH ENDOLASER;  Surgeon: Bernarda Caffey, MD;  Location: [facility];  Service: Ophthalmology;  Laterality: Left;    FAMILY HISTORY Family History  Problem Relation Age of Onset  . Cancer Mother   . Diabetes Mother   . Stroke Mother   . Heart disease Father   . Stroke Father   . Diabetes Brother   . Heart disease Brother   . Diabetes Brother   . Colon cancer Neg Hx     SOCIAL HISTORY Social History   Tobacco Use  . Smoking status: Former Research scientist (life sciences)  . Smokeless tobacco: Never Used  Substance Use Topics  . Alcohol use: No    Alcohol/week: 0.0 oz  . Drug use: No         OPHTHALMIC EXAM:  Base Eye Exam    Visual Acuity (Snellen - Linear)      Right Left   Dist [place] 20/25 20/200   Dist ph [place] 20/20 20/70       Tonometry (Tonopen, 10:22 AM)      Right Left   Pressure 17 24       Pupils      Dark Light Shape React APD   Right 4 3 Round Brisk None   Left 6 6 Round Minimal None       Neuro/Psych    Oriented x3:  Yes   Mood/Affect:  Normal       Dilation    Left eye:  1.0% Mydriacyl, 2.5% Phenylephrine @ 10:23 AM        Slit Lamp and Fundus Exam    Slit Lamp Exam      Right Left   Lids/Lashes Dermatochalasis - upper lid Dermatochalasis - upper lid, peri orbital edema, Ecchymosis   Conjunctiva/Sclera White and quiet Subconjunctival hemorrhage - improving, sutures desolving   Cornea Arcus, Well healed cataract wounds Arcus, Well healed cataract wounds,  microcystic edema, superior epi defect - semi circular 2.49m vertically by 5.51mhorizontally with surrounding sluffed epithelium    Anterior Chamber Deep and quiet, stent at 0230 and 0300 angle  1+ Cell, deep   Iris Round and dilated to 7.2553mound and dilated to 8mm48mLens PC IOL in good position, trace Posterior capsular opacification PC IOL in good position   Vitreous Vitreous syneresis Post vitrectomy, gas bubble at 45%  Fundus Exam      Right Left   Disc  360 Peripapillary atrophy, +cupping   C/D Ratio 0.6 0.6   Macula  flat under gas   Vessels  Mild Vascular attenuation   Periphery  Retina attached; good buckle height, great laser in place 360 on buckle and just posterior to buckle          IMAGING AND PROCEDURES  Imaging and Procedures for 08/13/17           ASSESSMENT/PLAN:    ICD-10-CM   1. Left retinal detachment H33.22   2. Retinal edema H35.81 CANCELED: OCT, Retina - OU - Both Eyes  3. Pseudophakia of both eyes Z96.1   4. Primary open angle glaucoma (POAG) of both eyes, mild stage H40.1131     1. Retinal detachment, OS-  - bullous superior detachment from 1030 to 0230, fovea splitting - small breaks ST quad within patch of thin retina - POW3 s/p SBP + PPV/PFC/EL/FAX/14% C3F8 OS, 05.02.19             - doing well -- notes improving vision as gas bubble reabsorbs             - retina attached and in good position -- good buckle height and laser around breaks             - IOP 24 today             - cont   PF 4x/day OS   Brimonidine BID OS -- STOP                         Cosopt BID OS -- re-start                         PSO ung QID OS  - developed corneal abrasion / epi defect OS  - start [place] QID OS             - avoid laying flat on back             - eye shield when sleeping             - post op drop and positioning instructions reviewed             - tylenol/ibuprofen for pain - F/U Friday for K check, OCT, DFE OS  2. Retinal  edema - mild IRF within detached retina on initial evaluation  3. Pseudophakia OU  - s/p CE/IOL w/ iStent OU by Dr. Cher Nakai  - beautiful surgeries, doing well   - IOP mildly elevated as above  - monitor  4. POAG OU - s/p iStents OU as above - IOP slightly elevated as above - under the expert management of Dr. Cher Nakai  Ophthalmic Meds Ordered this visit:  Meds ordered this encounter  Medications  . bacitracin-polymyxin b (POLYSPORIN) ophthalmic ointment    Sig: Place into the left eye 4 (four) times daily. Place a 1/2 inch ribbon of ointment into the lower eyelid.    Dispense:  3.5 g    Refill:  1       Return in about 3 days (around 09/10/2017) for POV; K check, OCT, DFE OS.  There are no Patient Instructions on file for this visit.   Explained the diagnoses, plan, and follow up with the patient and they expressed understanding.  Patient expressed understanding of the importance of proper follow up care.  This document serves as a record of services personally performed by Gardiner Sleeper, MD, PhD. It was created on their behalf by Ernest Mallick, OA, an ophthalmic assistant. The creation of this record is the provider's dictation and/or activities during the visit.    Electronically signed by: Ernest Mallick, OA  05.23.2019 8:23 AM     Gardiner Sleeper, M.D., Ph.D. Diseases & Surgery of the Retina and Vitreous [facility] 09/07/17  I have reviewed the above documentation for accuracy and completeness, and I agree with the above. Gardiner Sleeper, M.D., Ph.D. 09/09/17 8:23 AM        Abbreviations: M myopia (nearsighted); A astigmatism; H hyperopia (farsighted); P presbyopia; Mrx spectacle prescription;  CTL contact lenses; OD right eye; OS left eye; OU both eyes  XT exotropia; ET esotropia; PEK punctate epithelial keratitis; PEE punctate epithelial erosions; DES dry eye syndrome; MGD meibomian gland dysfunction; ATs artificial tears; PFAT's  preservative free artificial tears; [place] nuclear sclerotic cataract; PSC posterior subcapsular cataract; ERM epi-retinal membrane; PVD posterior vitreous detachment; RD retinal detachment; DM diabetes mellitus; DR diabetic retinopathy; NPDR non-proliferative diabetic retinopathy; PDR proliferative diabetic retinopathy; CSME clinically significant macular edema; DME diabetic macular edema; dbh dot blot hemorrhages; CWS cotton wool spot; POAG primary open angle glaucoma; C/D cup-to-disc ratio; HVF humphrey visual field; GVF goldmann visual field; OCT optical coherence tomography; IOP intraocular pressure; BRVO Branch retinal vein occlusion; CRVO central retinal vein occlusion; CRAO central retinal artery occlusion; BRAO branch retinal artery occlusion; RT retinal tear; SB scleral buckle; PPV pars plana vitrectomy; VH Vitreous hemorrhage; PRP panretinal laser photocoagulation; IVK intravitreal kenalog; VMT vitreomacular traction; MH Macular hole;  NVD neovascularization of the disc; NVE neovascularization elsewhere; AREDS age related eye disease study; ARMD age related macular degeneration; POAG primary open angle glaucoma; EBMD epithelial/anterior basement membrane dystrophy; ACIOL anterior chamber intraocular lens; IOL intraocular lens; PCIOL posterior chamber intraocular lens; Phaco/IOL phacoemulsification with intraocular lens placement; [place] photorefractive keratectomy; LASIK laser assisted in situ keratomileusis; HTN hypertension; DM diabetes mellitus; COPD chronic obstructive pulmonary disease

## 2017-09-07 ENCOUNTER — Ambulatory Visit (INDEPENDENT_AMBULATORY_CARE_PROVIDER_SITE_OTHER): Payer: Medicare HMO | Admitting: Ophthalmology

## 2017-09-07 ENCOUNTER — Encounter (INDEPENDENT_AMBULATORY_CARE_PROVIDER_SITE_OTHER): Payer: Self-pay | Admitting: Ophthalmology

## 2017-09-07 DIAGNOSIS — H401131 Primary open-angle glaucoma, bilateral, mild stage: Secondary | ICD-10-CM

## 2017-09-07 DIAGNOSIS — H3322 Serous retinal detachment, left eye: Secondary | ICD-10-CM

## 2017-09-07 DIAGNOSIS — Z961 Presence of intraocular lens: Secondary | ICD-10-CM

## 2017-09-07 DIAGNOSIS — H3581 Retinal edema: Secondary | ICD-10-CM

## 2017-09-07 MED ORDER — BACITRACIN-POLYMYXIN B 500-10000 UNIT/GM OP OINT: TOPICAL_OINTMENT | Freq: Four times a day (QID) | OPHTHALMIC | 1 refills | Status: DC

## 2017-09-09 ENCOUNTER — Encounter (INDEPENDENT_AMBULATORY_CARE_PROVIDER_SITE_OTHER): Payer: Self-pay | Admitting: Ophthalmology

## 2017-09-09 NOTE — Progress Notes (Addendum)
[facility] Note  09/10/2017     CHIEF COMPLAINT Patient presents for Post-op Follow-up   HISTORY OF PRESENT ILLNESS: Jeffery George is a 72 y.o. male who presents to the clinic today for:   HPI    Post-op Follow-up    In left eye.  Discomfort includes floaters.  Negative for pain, itching, foreign body sensation, tearing, discharge and none.  Vision is stable.  I, the attending physician,  performed the HPI with the patient and updated documentation appropriately.          Comments    POV #4 SBP+PPV/EL/FAX/14%c3F8OS Patient states is to have "scratch  on his eye rechecked", his  pain is gone, he has occasional floater OS. Patient is compliant with eye gtt's is instructed on Monday.         Last edited by Bernarda Caffey, MD on 09/10/2017  1:41 PM. (History)      Referring physician: [name], MD [place], [place] [address]  HISTORICAL INFORMATION:   Selected notes from the MEDICAL RECORD NUMBER Referred by Dr. Parke Simmers for possible RD OS LEE- 05.01.19  Ocular Hx- pseudophakia OU with dual I-stent (OD 02.11.19 OS 02.25.19 with Dr. Cher Nakai),  PMH- DM, heart murmur    CURRENT MEDICATIONS: Current Outpatient Medications (Ophthalmic Drugs)  Medication Sig  . bacitracin-polymyxin b (POLYSPORIN) ophthalmic ointment Place into the left eye 4 (four) times daily. Place a 1/2 inch ribbon of ointment into the lower eyelid.  Marland Kitchen latanoprost (XALATAN) 0.005 % ophthalmic solution Place 1 drop into both eyes every evening.   . prednisoLONE acetate (PRED FORTE) 1 % ophthalmic suspension Place 1 drop into the left eye 4 (four) times daily.   No current facility-administered medications for this visit.  (Ophthalmic Drugs)   Current Outpatient Medications (Other)  Medication Sig  . ACCU-CHEK SOFTCLIX LANCETS lancets Test blood sugar once daily. Dx E11.9  . amLODipine (NORVASC) 5 MG tablet TAKE 1 TABLET DAILY  . aspirin EC 81 MG tablet Take  1 tablet (81 mg total) by mouth daily.  Marland Kitchen atorvastatin (LIPITOR) 10 MG tablet Take 1 tablet (10 mg total) by mouth daily.  . blood glucose meter kit and supplies TrueMetrix, TruTrack, Accu-Chek Aviva Expert, Accu-Chek Aviva Plus, Accu-Chek Guide, Accu-Chek Nano, or Accu-Chek Smartview.   Use once per day.  . Blood Glucose Monitoring Suppl (BLOOD GLUCOSE MONITOR KIT) KIT Use to test blood sugar twice daily.  . colchicine (COLCRYS) 0.6 MG tablet Take 2 tabs po at symptom onset. Repeat 1 tab po 1 hour later.  Marland Kitchen glucose blood (ONE TOUCH ULTRA TEST) test strip 1 each by Other route daily. Use as instructed  . HYDROcodone-acetaminophen (NORCO/VICODIN) 5-325 MG tablet Take 1 tablet by mouth every 4 (four) hours as needed for moderate pain.  . indomethacin (INDOCIN) 50 MG capsule TAKE 1 CAPSULE THREE TIMES A DAY AS NEEDED FOR MODERATE PAIN  . Lancets (ONETOUCH ULTRASOFT) lancets Use as instructed  . losartan (COZAAR) 100 MG tablet Take 1 tablet (100 mg total) by mouth daily.  . metFORMIN (GLUCOPHAGE) 500 MG tablet Take 1 tablet (500 mg total) by mouth daily with breakfast.  . Omega-3 Fatty Acids (FISH OIL) 1000 MG CAPS Take by mouth daily.    No current facility-administered medications for this visit.  (Other)      REVIEW OF SYSTEMS: ROS    Positive for: Eyes   Negative for: Constitutional, Gastrointestinal, Neurological, Skin, Genitourinary, Musculoskeletal, HENT, Endocrine, Cardiovascular, Respiratory,  Psychiatric, Allergic/Imm, Heme/Lymph   Last edited by Zenovia Jordan, LPN on 0/06/7046 88:91 AM. (History)       ALLERGIES No Known Allergies  PAST MEDICAL HISTORY Past Medical History:  Diagnosis Date  . Arthritis   . Diabetes mellitus without complication ([place])   . Heart murmur   . Hypertension    Past Surgical History:  Procedure Laterality Date  . CATARACT EXTRACTION    . FRACTURE SURGERY     Lt ankle  . GAS/FLUID EXCHANGE Left 08/12/2017   Procedure: C3F8 GAS/FLUID  EXCHANGE;  Surgeon: Bernarda Caffey, MD;  Location: [facility];  Service: Ophthalmology;  Laterality: Left;  . HERNIA REPAIR    . IRIDOTOMY / IRIDECTOMY Bilateral    stents  . VASECTOMY    . VITRECTOMY 25 GAUGE WITH SCLERAL BUCKLE Left 08/12/2017   Procedure: VITRECTOMY 25 GAUGE WITH SCLERAL BUCKLE WITH ENDOLASER;  Surgeon: Bernarda Caffey, MD;  Location: [facility];  Service: Ophthalmology;  Laterality: Left;    FAMILY HISTORY Family History  Problem Relation Age of Onset  . Cancer Mother   . Diabetes Mother   . Stroke Mother   . Heart disease Father   . Stroke Father   . Diabetes Brother   . Heart disease Brother   . Diabetes Brother   . Colon cancer Neg Hx     SOCIAL HISTORY Social History   Tobacco Use  . Smoking status: Former Research scientist (life sciences)  . Smokeless tobacco: Never Used  Substance Use Topics  . Alcohol use: No    Alcohol/week: 0.0 oz  . Drug use: No         OPHTHALMIC EXAM:  Base Eye Exam    Visual Acuity (Snellen - Linear)      Right Left   Dist [place] 20/25 20/100   Dist ph [place] 20/20 20/60       Tonometry (Tonopen, 10:20 AM)      Right Left   Pressure 18 25       Pupils      Dark Light Shape React APD   Right 4 3 Round Brisk None   Left 5 4 Round Minimal None       Visual Fields (Counting fingers)      Left Right    Full Full       Extraocular Movement      Right Left    Full, Ortho Full, Ortho       Neuro/Psych    Oriented x3:  Yes   Mood/Affect:  Normal       Dilation    Both eyes:  1.0% Mydriacyl, Paremyd @ 10:20 AM        Slit Lamp and Fundus Exam    Slit Lamp Exam      Right Left   Lids/Lashes Dermatochalasis - upper lid Dermatochalasis - upper lid, peri orbital edema, Ecchymosis   Conjunctiva/Sclera White and quiet Subconjunctival hemorrhage - improving, sutures dissolving   Cornea Arcus, Well healed cataract wounds Arcus, Well healed cataract wounds, microcystic edema, superior epi defect almost closed -- now 0.56m, round with surrounding irreg  epithelium   Anterior Chamber Deep and quiet, stent at 0230 and 0300 angle  1+ Cell, deep   Iris Round and dilated to 7.240mRound and dilated to 50m65m Lens PC IOL in good position, trace Posterior capsular opacification PC IOL in good position   Vitreous Vitreous syneresis Post vitrectomy, gas bubble at 40-45%       Fundus Exam  Right Left   Disc  360 Peripapillary atrophy, +cupping   C/D Ratio 0.6 0.6   Macula  flat under gas   Vessels  Mild Vascular attenuation   Periphery  Retina attached; good buckle height, great laser in place 360 on buckle and just posterior to buckle          IMAGING AND PROCEDURES  Imaging and Procedures for 08/13/17  OCT, Retina - OU - Both Eyes       Right Eye Quality was good. Central Foveal Thickness: 261. Progression has been stable. Findings include normal foveal contour, no IRF, no SRF, retinal drusen  (Trace ERM).   Left Eye Quality was good. Central Foveal Thickness: 466. Progression has improved. Findings include normal foveal contour, no IRF, no SRF, outer retinal atrophy.   Notes *Images captured and stored on drive  Diagnosis / Impression:  OD: NFP, No IRF/SRF; scattered drusen OS: retina / fovea reattached, mild ORA in formerly detached retina  Clinical management:  See below  Abbreviations: NFP - Normal foveal profile. CME - cystoid macular edema. PED - pigment epithelial detachment. IRF - intraretinal fluid. SRF - subretinal fluid. EZ - ellipsoid zone. ERM - epiretinal membrane. ORA - outer retinal atrophy. ORT - outer retinal tubulation. SRHM - subretinal hyper-reflective material                  ASSESSMENT/PLAN:    ICD-10-CM   1. Left retinal detachment H33.22   2. Retinal edema H35.81 OCT, Retina - OU - Both Eyes  3. Pseudophakia of both eyes Z96.1   4. Primary open angle glaucoma (POAG) of both eyes, mild stage H40.1131     1. Retinal detachment, OS-  - bullous superior detachment from 1030 to 0230,  fovea splitting - small breaks ST quad within patch of thin retina - s/p SBP + PPV/PFC/EL/FAX/14% C3F8 OS, 05.02.19             - doing well             - retina reattached and in good position -- good buckle height and laser around breaks  - BCVA 20/60 OS!             - IOP 25 today             - cont   PF 4x/day OS                         Atropine BID OS   Brimonidine BID OS                         Cosopt BID OS                         PSO ung QID/PRN OS -- can stop after 5 days   Besivance QID OS -- until bottle runs out             - avoid laying flat on back             - post op drop and positioning instructions reviewed             - tylenol/ibuprofen for pain             - Rx given for breakthrough pain - F/U 2 wks  2. Retinal edema - mild IRF within detached retina on initial evaluation  3. Pseudophakia OU  -  s/p CE/IOL w/ iStent OU by Dr. Cher Nakai  - beautiful surgeries, doing well  - monitor  4. POAG OU - s/p iStents OU as above - IOP remains slightly elevated today -- drops as above - under the expert management of Dr. Cher Nakai   Ophthalmic Meds Ordered this visit:  No orders of the defined types were placed in this encounter.      Return in about 2 weeks (around 09/24/2017) for POV.  There are no Patient Instructions on file for this visit.   Explained the diagnoses, plan, and follow up with the patient and they expressed understanding.  Patient expressed understanding of the importance of proper follow up care.   This document serves as a record of services personally performed by Gardiner Sleeper, MD, PhD. It was created on their behalf by Catha Brow, [place], a certified ophthalmic assistant. The creation of this record is the provider's dictation and/or activities during the visit.  Electronically signed by: Catha Brow, COA  05.30.19 1:41 PM    Gardiner Sleeper, M.D., Ph.D. Diseases & Surgery of the Retina and Vitreous [facility]  I have reviewed the above documentation for accuracy and completeness, and I agree with the above. Gardiner Sleeper, M.D., Ph.D. 09/10/17 2:34 PM      Abbreviations: M myopia (nearsighted); A astigmatism; H hyperopia (farsighted); P presbyopia; Mrx spectacle prescription;  CTL contact lenses; OD right eye; OS left eye; OU both eyes  XT exotropia; ET esotropia; PEK punctate epithelial keratitis; PEE punctate epithelial erosions; DES dry eye syndrome; MGD meibomian gland dysfunction; ATs artificial tears; PFAT's preservative free artificial tears; [place] nuclear sclerotic cataract; PSC posterior subcapsular cataract; ERM epi-retinal membrane; PVD posterior vitreous detachment; RD retinal detachment; DM diabetes mellitus; DR diabetic retinopathy; NPDR non-proliferative diabetic retinopathy; PDR proliferative diabetic retinopathy; CSME clinically significant macular edema; DME diabetic macular edema; dbh dot blot hemorrhages; CWS cotton wool spot; POAG primary open angle glaucoma; C/D cup-to-disc ratio; HVF humphrey visual field; GVF goldmann visual field; OCT optical coherence tomography; IOP intraocular pressure; BRVO Branch retinal vein occlusion; CRVO central retinal vein occlusion; CRAO central retinal artery occlusion; BRAO branch retinal artery occlusion; RT retinal tear; SB scleral buckle; PPV pars plana vitrectomy; VH Vitreous hemorrhage; PRP panretinal laser photocoagulation; IVK intravitreal kenalog; VMT vitreomacular traction; MH Macular hole;  NVD neovascularization of the disc; NVE neovascularization elsewhere; AREDS age related eye disease study; ARMD age related macular degeneration; POAG primary open angle glaucoma; EBMD epithelial/anterior basement membrane dystrophy; ACIOL anterior chamber intraocular lens; IOL intraocular lens; PCIOL posterior chamber intraocular lens; Phaco/IOL phacoemulsification with intraocular lens placement; [place] photorefractive keratectomy; LASIK laser  assisted in situ keratomileusis; HTN hypertension; DM diabetes mellitus; COPD chronic obstructive pulmonary disease

## 2017-09-10 ENCOUNTER — Ambulatory Visit (INDEPENDENT_AMBULATORY_CARE_PROVIDER_SITE_OTHER): Payer: Medicare HMO | Admitting: Ophthalmology

## 2017-09-10 ENCOUNTER — Encounter (INDEPENDENT_AMBULATORY_CARE_PROVIDER_SITE_OTHER): Payer: Self-pay | Admitting: Ophthalmology

## 2017-09-10 DIAGNOSIS — H3581 Retinal edema: Secondary | ICD-10-CM | POA: Diagnosis not present

## 2017-09-10 DIAGNOSIS — H401131 Primary open-angle glaucoma, bilateral, mild stage: Secondary | ICD-10-CM

## 2017-09-10 DIAGNOSIS — H3322 Serous retinal detachment, left eye: Secondary | ICD-10-CM

## 2017-09-10 DIAGNOSIS — Z961 Presence of intraocular lens: Secondary | ICD-10-CM

## 2017-09-23 NOTE — Progress Notes (Signed)
[facility] Note  09/24/2017     CHIEF COMPLAINT Patient presents for Post-op Follow-up   HISTORY OF PRESENT ILLNESS: Jeffery George is a 72 y.o. male who presents to the clinic today for:   HPI    Post-op Follow-up    In left eye.  Discomfort includes none.  Negative for pain, itching, foreign body sensation, tearing, discharge and floaters.  Vision is stable.  I, the attending physician,  performed the HPI with the patient and updated documentation appropriately.          Comments    Pt presents for RD OS F/U, pt states VA has improved a lot since last visit, pt denies flashes, pain or wavy vision, but states he has a lot of floaters the past 3 or 4 days, pt states he can still see the gas bubble but states it has dropped down some, pt states compliance with gtts,        Last edited by Bernarda Caffey, MD on 09/27/2017 10:26 PM. (History)      Referring physician: [name], MD [place], [place] [address]  HISTORICAL INFORMATION:   Selected notes from the MEDICAL RECORD NUMBER Referred by Dr. Parke Simmers for possible RD OS LEE- 05.01.19  Ocular Hx- pseudophakia OU with dual I-stent (OD 02.11.19 OS 02.25.19 with Dr. Cher Nakai),  PMH- DM, heart murmur    CURRENT MEDICATIONS: Current Outpatient Medications (Ophthalmic Drugs)  Medication Sig  . bacitracin-polymyxin b (POLYSPORIN) ophthalmic ointment Place into the left eye 4 (four) times daily. Place a 1/2 inch ribbon of ointment into the lower eyelid.  Marland Kitchen latanoprost (XALATAN) 0.005 % ophthalmic solution Place 1 drop into both eyes every evening.   . prednisoLONE acetate (PRED FORTE) 1 % ophthalmic suspension Place 1 drop into the left eye 4 (four) times daily.   No current facility-administered medications for this visit.  (Ophthalmic Drugs)   Current Outpatient Medications (Other)  Medication Sig  . ACCU-CHEK SOFTCLIX LANCETS lancets Test blood sugar once daily. Dx E11.9  .  amLODipine (NORVASC) 5 MG tablet TAKE 1 TABLET DAILY  . aspirin EC 81 MG tablet Take 1 tablet (81 mg total) by mouth daily.  Marland Kitchen atorvastatin (LIPITOR) 10 MG tablet Take 1 tablet (10 mg total) by mouth daily.  . blood glucose meter kit and supplies TrueMetrix, TruTrack, Accu-Chek Aviva Expert, Accu-Chek Aviva Plus, Accu-Chek Guide, Accu-Chek Nano, or Accu-Chek Smartview.   Use once per day.  . Blood Glucose Monitoring Suppl (BLOOD GLUCOSE MONITOR KIT) KIT Use to test blood sugar twice daily.  . colchicine (COLCRYS) 0.6 MG tablet Take 2 tabs po at symptom onset. Repeat 1 tab po 1 hour later.  Marland Kitchen glucose blood (ONE TOUCH ULTRA TEST) test strip 1 each by Other route daily. Use as instructed  . HYDROcodone-acetaminophen (NORCO/VICODIN) 5-325 MG tablet Take 1 tablet by mouth every 4 (four) hours as needed for moderate pain.  . indomethacin (INDOCIN) 50 MG capsule TAKE 1 CAPSULE THREE TIMES A DAY AS NEEDED FOR MODERATE PAIN  . Lancets (ONETOUCH ULTRASOFT) lancets Use as instructed  . losartan (COZAAR) 100 MG tablet Take 1 tablet (100 mg total) by mouth daily.  . metFORMIN (GLUCOPHAGE) 500 MG tablet Take 1 tablet (500 mg total) by mouth daily with breakfast.  . Omega-3 Fatty Acids (FISH OIL) 1000 MG CAPS Take by mouth daily.    No current facility-administered medications for this visit.  (Other)      REVIEW OF  SYSTEMS: ROS    Positive for: Endocrine, Cardiovascular, Eyes   Negative for: Constitutional, Gastrointestinal, Neurological, Skin, Genitourinary, Musculoskeletal, HENT, Respiratory, Psychiatric, Allergic/Imm, Heme/Lymph   Last edited by Debbrah Alar, COT on 09/24/2017 10:12 AM. (History)       ALLERGIES No Known Allergies  PAST MEDICAL HISTORY Past Medical History:  Diagnosis Date  . Arthritis   . Diabetes mellitus without complication ([place])   . Heart murmur   . Hypertension    Past Surgical History:  Procedure Laterality Date  . CATARACT EXTRACTION    . FRACTURE SURGERY      Lt ankle  . GAS/FLUID EXCHANGE Left 08/12/2017   Procedure: C3F8 GAS/FLUID EXCHANGE;  Surgeon: Bernarda Caffey, MD;  Location: [facility];  Service: Ophthalmology;  Laterality: Left;  . HERNIA REPAIR    . IRIDOTOMY / IRIDECTOMY Bilateral    stents  . VASECTOMY    . VITRECTOMY 25 GAUGE WITH SCLERAL BUCKLE Left 08/12/2017   Procedure: VITRECTOMY 25 GAUGE WITH SCLERAL BUCKLE WITH ENDOLASER;  Surgeon: Bernarda Caffey, MD;  Location: [facility];  Service: Ophthalmology;  Laterality: Left;    FAMILY HISTORY Family History  Problem Relation Age of Onset  . Cancer Mother   . Diabetes Mother   . Stroke Mother   . Heart disease Father   . Stroke Father   . Diabetes Brother   . Heart disease Brother   . Diabetes Brother   . Colon cancer Neg Hx     SOCIAL HISTORY Social History   Tobacco Use  . Smoking status: Former Research scientist (life sciences)  . Smokeless tobacco: Never Used  Substance Use Topics  . Alcohol use: No    Alcohol/week: 0.0 oz  . Drug use: No         OPHTHALMIC EXAM:  Base Eye Exam    Visual Acuity (Snellen - Linear)      Right Left   Dist [place] 20/30 20/150 -1   Dist ph [place] 20/20 20/30 -1       Tonometry (Tonopen, 10:20 AM)      Right Left   Pressure 21 16       Pupils      Dark Light Shape React APD   Right 7 7 Round NR None   Left 5 3 Round Brisk None       Neuro/Psych    Oriented x3:  Yes   Mood/Affect:  Normal       Dilation    Left eye:  1.0% Mydriacyl, 2.5% Phenylephrine @ 10:20 AM        Slit Lamp and Fundus Exam    Slit Lamp Exam      Right Left   Lids/Lashes Dermatochalasis - upper lid Dermatochalasis - upper lid, peri orbital edema, Ecchymosis   Conjunctiva/Sclera White and quiet Subconjunctival hemorrhage - improving, sutures dissolving   Cornea Arcus, Well healed cataract wounds Arcus, Well healed cataract wounds, microcystic edema, superior epi defect almost closed -- now 0.67m, round with surrounding irreg epithelium   Anterior Chamber Deep and quiet, stent at  0230 and 0300 angle  1+ Cell and pigment, deep   Iris Round and dilated to 7.256mRound and dilated to 77m91mPosterior synechiae at 0600   Lens PC IOL in good position, trace Posterior capsular opacification PC IOL in good position   Vitreous Vitreous syneresis Post vitrectomy, gas bubble at 30-35%       Fundus Exam      Right Left   Disc  360 Peripapillary atrophy, +  cupping   C/D Ratio 0.6 0.6   Macula  flat under gas   Vessels  Mild Vascular attenuation   Periphery  Retina attached; good buckle height, great laser in place 360 on buckle and just posterior to buckle          IMAGING AND PROCEDURES  Imaging and Procedures for 08/13/17  OCT, Retina - OU - Both Eyes       Right Eye Quality was good. Central Foveal Thickness: 263. Progression has been stable. Findings include normal foveal contour, no IRF, no SRF, retinal drusen  (Trace ERM).   Left Eye Quality was good. Central Foveal Thickness: 273. Progression has been stable. Findings include normal foveal contour, no IRF, no SRF, outer retinal atrophy.   Notes *Images captured and stored on drive  Diagnosis / Impression:  OD: NFP, No IRF/SRF; scattered drusen OS: retina / fovea reattached, mild ORA in formerly detached retina  Clinical management:  See below  Abbreviations: NFP - Normal foveal profile. CME - cystoid macular edema. PED - pigment epithelial detachment. IRF - intraretinal fluid. SRF - subretinal fluid. EZ - ellipsoid zone. ERM - epiretinal membrane. ORA - outer retinal atrophy. ORT - outer retinal tubulation. SRHM - subretinal hyper-reflective material                  ASSESSMENT/PLAN:    ICD-10-CM   1. Left retinal detachment H33.22   2. Retinal edema H35.81 OCT, Retina - OU - Both Eyes  3. Pseudophakia of both eyes Z96.1   4. Primary open angle glaucoma (POAG) of both eyes, mild stage H40.1131     1. Retinal detachment, OS-  - bullous superior detachment from 1030 to 0230, fovea  splitting - small breaks ST quad within patch of thin retina - s/p SBP + PPV/PFC/EL/FAX/14% C3F8 OS, 05.02.19             - doing well             - retina reattached and in good position -- good buckle height and laser around breaks  - gas bubble 30-35%  - BCVA 20/30-1 OS!             - IOP 16 today             - cont   PF 4x/day OS                         Atropine BID OS -- STOP   Brimonidine BID OS-- STOP                         Cosopt BID OS                         PSO ung PRN OS              - avoid laying flat on back             - post op drop and positioning instructions reviewed             - tylenol/ibuprofen for pain             - Rx given for breakthrough pain - F/U 3-4 weeks  2. Retinal edema - mild IRF within detached retina on initial evaluation   3. Pseudophakia OU  - s/p CE/IOL w/ iStent OU by Dr. Cher Nakai  - beautiful surgeries,  doing well  - monitor   4. POAG OU - s/p iStents OU as above - IOP remains slightly elevated today -- drops as above - under the expert management of Dr. Cher Nakai  Ophthalmic Meds Ordered this visit:  No orders of the defined types were placed in this encounter.      Return in about 1 month (around 10/22/2017) for POV.  There are no Patient Instructions on file for this visit.   Explained the diagnoses, plan, and follow up with the patient and they expressed understanding.  Patient expressed understanding of the importance of proper follow up care.   This document serves as a record of services personally performed by Gardiner Sleeper, MD, PhD. It was created on their behalf by Ernest Mallick, OA, an ophthalmic assistant. The creation of this record is the provider's dictation and/or activities during the visit.    Electronically signed by: Ernest Mallick, OA  06.13.2019 10:27 PM   This document serves as a record of services personally performed by Gardiner Sleeper, MD, PhD. It was created on their behalf by Catha Brow,  [place], a certified ophthalmic assistant. The creation of this record is the provider's dictation and/or activities during the visit.  Electronically signed by: Catha Brow, [place]  06.14.19 10:27 PM   Gardiner Sleeper, M.D., Ph.D. Diseases & Surgery of the Retina and Vitreous [facility]  I have reviewed the above documentation for accuracy and completeness, and I agree with the above. Gardiner Sleeper, M.D., Ph.D. 09/27/17 10:29 PM   Abbreviations: M myopia (nearsighted); A astigmatism; H hyperopia (farsighted); P presbyopia; Mrx spectacle prescription;  CTL contact lenses; OD right eye; OS left eye; OU both eyes  XT exotropia; ET esotropia; PEK punctate epithelial keratitis; PEE punctate epithelial erosions; DES dry eye syndrome; MGD meibomian gland dysfunction; ATs artificial tears; PFAT's preservative free artificial tears; [place] nuclear sclerotic cataract; PSC posterior subcapsular cataract; ERM epi-retinal membrane; PVD posterior vitreous detachment; RD retinal detachment; DM diabetes mellitus; DR diabetic retinopathy; NPDR non-proliferative diabetic retinopathy; PDR proliferative diabetic retinopathy; CSME clinically significant macular edema; DME diabetic macular edema; dbh dot blot hemorrhages; CWS cotton wool spot; POAG primary open angle glaucoma; C/D cup-to-disc ratio; HVF humphrey visual field; GVF goldmann visual field; OCT optical coherence tomography; IOP intraocular pressure; BRVO Branch retinal vein occlusion; CRVO central retinal vein occlusion; CRAO central retinal artery occlusion; BRAO branch retinal artery occlusion; RT retinal tear; SB scleral buckle; PPV pars plana vitrectomy; VH Vitreous hemorrhage; PRP panretinal laser photocoagulation; IVK intravitreal kenalog; VMT vitreomacular traction; MH Macular hole;  NVD neovascularization of the disc; NVE neovascularization elsewhere; AREDS age related eye disease study; ARMD age related macular degeneration; POAG  primary open angle glaucoma; EBMD epithelial/anterior basement membrane dystrophy; ACIOL anterior chamber intraocular lens; IOL intraocular lens; PCIOL posterior chamber intraocular lens; Phaco/IOL phacoemulsification with intraocular lens placement; [place] photorefractive keratectomy; LASIK laser assisted in situ keratomileusis; HTN hypertension; DM diabetes mellitus; COPD chronic obstructive pulmonary disease

## 2017-09-24 ENCOUNTER — Encounter (INDEPENDENT_AMBULATORY_CARE_PROVIDER_SITE_OTHER): Payer: Self-pay | Admitting: Ophthalmology

## 2017-09-24 ENCOUNTER — Ambulatory Visit (INDEPENDENT_AMBULATORY_CARE_PROVIDER_SITE_OTHER): Payer: Medicare HMO | Admitting: Ophthalmology

## 2017-09-24 DIAGNOSIS — H3581 Retinal edema: Secondary | ICD-10-CM

## 2017-09-24 DIAGNOSIS — H3322 Serous retinal detachment, left eye: Secondary | ICD-10-CM

## 2017-09-24 DIAGNOSIS — H401131 Primary open-angle glaucoma, bilateral, mild stage: Secondary | ICD-10-CM

## 2017-09-24 DIAGNOSIS — Z961 Presence of intraocular lens: Secondary | ICD-10-CM

## 2017-09-27 ENCOUNTER — Encounter (INDEPENDENT_AMBULATORY_CARE_PROVIDER_SITE_OTHER): Payer: Self-pay | Admitting: Ophthalmology

## 2017-10-19 NOTE — Progress Notes (Signed)
[facility] Note  10/22/2017     CHIEF COMPLAINT Patient presents for Post-op Follow-up   HISTORY OF PRESENT ILLNESS: Jeffery George is a 72 y.o. male who presents to the clinic today for:   HPI    Post-op Follow-up    In right eye.  Discomfort includes none.  I, the attending physician,  performed the HPI with the patient and updated documentation appropriately.          Comments    F/U RD OS. Patient states he still has occasional floaters OS, gas bubble smaller lower left corner, he has return to driving some . Denies ocular pain       Last edited by Bernarda Caffey, MD on 10/22/2017 11:03 AM. (History)      Referring physician: [name], MD [place], [place] [address]  HISTORICAL INFORMATION:   Selected notes from the MEDICAL RECORD NUMBER Referred by Dr. Parke Simmers for possible RD OS LEE- 05.01.19  Ocular Hx- pseudophakia OU with dual I-stent (OD 02.11.19 OS 02.25.19 with Dr. Cher Nakai),  PMH- DM, heart murmur    CURRENT MEDICATIONS: Current Outpatient Medications (Ophthalmic Drugs)  Medication Sig  . bacitracin-polymyxin b (POLYSPORIN) ophthalmic ointment Place into the left eye 4 (four) times daily. Place a 1/2 inch ribbon of ointment into the lower eyelid.  Marland Kitchen latanoprost (XALATAN) 0.005 % ophthalmic solution Place 1 drop into both eyes every evening.   . prednisoLONE acetate (PRED FORTE) 1 % ophthalmic suspension Place 1 drop into the left eye 4 (four) times daily.  . brimonidine (ALPHAGAN) 0.2 % ophthalmic solution Place 1 drop into the left eye 2 (two) times daily.  . dorzolamide-timolol (COSOPT) 22.3-6.8 MG/ML ophthalmic solution Place 1 drop into the left eye 2 (two) times daily.   No current facility-administered medications for this visit.  (Ophthalmic Drugs)   Current Outpatient Medications (Other)  Medication Sig  . ACCU-CHEK SOFTCLIX LANCETS lancets Test blood sugar once daily. Dx E11.9  . amLODipine  (NORVASC) 5 MG tablet TAKE 1 TABLET DAILY  . aspirin EC 81 MG tablet Take 1 tablet (81 mg total) by mouth daily.  Marland Kitchen atorvastatin (LIPITOR) 10 MG tablet Take 1 tablet (10 mg total) by mouth daily.  . blood glucose meter kit and supplies TrueMetrix, TruTrack, Accu-Chek Aviva Expert, Accu-Chek Aviva Plus, Accu-Chek Guide, Accu-Chek Nano, or Accu-Chek Smartview.   Use once per day.  . Blood Glucose Monitoring Suppl (BLOOD GLUCOSE MONITOR KIT) KIT Use to test blood sugar twice daily.  . colchicine (COLCRYS) 0.6 MG tablet Take 2 tabs po at symptom onset. Repeat 1 tab po 1 hour later.  Marland Kitchen glucose blood (ONE TOUCH ULTRA TEST) test strip 1 each by Other route daily. Use as instructed  . HYDROcodone-acetaminophen (NORCO/VICODIN) 5-325 MG tablet Take 1 tablet by mouth every 4 (four) hours as needed for moderate pain.  . indomethacin (INDOCIN) 50 MG capsule TAKE 1 CAPSULE THREE TIMES A DAY AS NEEDED FOR MODERATE PAIN  . Lancets (ONETOUCH ULTRASOFT) lancets Use as instructed  . losartan (COZAAR) 100 MG tablet Take 1 tablet (100 mg total) by mouth daily.  . metFORMIN (GLUCOPHAGE) 500 MG tablet Take 1 tablet (500 mg total) by mouth daily with breakfast.  . Omega-3 Fatty Acids (FISH OIL) 1000 MG CAPS Take by mouth daily.    No current facility-administered medications for this visit.  (Other)      REVIEW OF SYSTEMS: ROS    Positive for: Eyes  Negative for: Constitutional, Gastrointestinal, Neurological, Skin, Genitourinary, Musculoskeletal, HENT, Endocrine, Cardiovascular, Respiratory, Psychiatric, Allergic/Imm, Heme/Lymph   Last edited by Zenovia Jordan, LPN on 10/08/3660 94:76 AM. (History)       ALLERGIES No Known Allergies  PAST MEDICAL HISTORY Past Medical History:  Diagnosis Date  . Arthritis   . Diabetes mellitus without complication ([place])   . Heart murmur   . Hypertension    Past Surgical History:  Procedure Laterality Date  . CATARACT EXTRACTION    . FRACTURE SURGERY     Lt  ankle  . GAS/FLUID EXCHANGE Left 08/12/2017   Procedure: C3F8 GAS/FLUID EXCHANGE;  Surgeon: Bernarda Caffey, MD;  Location: [facility];  Service: Ophthalmology;  Laterality: Left;  . HERNIA REPAIR    . IRIDOTOMY / IRIDECTOMY Bilateral    stents  . VASECTOMY    . VITRECTOMY 25 GAUGE WITH SCLERAL BUCKLE Left 08/12/2017   Procedure: VITRECTOMY 25 GAUGE WITH SCLERAL BUCKLE WITH ENDOLASER;  Surgeon: Bernarda Caffey, MD;  Location: [place];  Service: Ophthalmology;  Laterality: Left;    FAMILY HISTORY Family History  Problem Relation Age of Onset  . Cancer Mother   . Diabetes Mother   . Stroke Mother   . Heart disease Father   . Stroke Father   . Diabetes Brother   . Heart disease Brother   . Diabetes Brother   . Colon cancer Neg Hx     SOCIAL HISTORY Social History   Tobacco Use  . Smoking status: Former Research scientist (life sciences)  . Smokeless tobacco: Never Used  Substance Use Topics  . Alcohol use: No    Alcohol/week: 0.0 oz  . Drug use: No         OPHTHALMIC EXAM:  Base Eye Exam    Visual Acuity (Snellen - Linear)      Right Left   Dist [place] 20/40 20/80 -1   Dist ph [place] 20/20 -2 20/30       Tonometry (Tonopen, 9:59 AM)      Right Left   Pressure 21 20       Pupils      Dark Light Shape React APD   Right 4 3 Round Slow None   Left 7 7 Round Minimal None       Visual Fields (Counting fingers)      Left Right    Full Full       Extraocular Movement      Right Left    Full, Ortho Full, Ortho       Neuro/Psych    Oriented x3:  Yes   Mood/Affect:  Normal       Dilation    Both eyes:  1.0% Mydriacyl, 2.5% Phenylephrine @ 9:59 AM        Slit Lamp and Fundus Exam    Slit Lamp Exam      Right Left   Lids/Lashes Dermatochalasis - upper lid Dermatochalasis - upper lid, peri orbital edema, Ecchymosis   Conjunctiva/Sclera White and quiet Subconjunctival hemorrhage - improving, sutures dissolving   Cornea Arcus, Well healed cataract wounds Arcus, Well healed cataract wounds, microcystic  edema, superior epi defect almost closed -- now 0.69m, round with surrounding irreg epithelium   Anterior Chamber Deep and quiet, stent at 0230 and 0300 angle  2+ Cell and pigment, deep   Iris Round and dilated to 7.235mRound and dilated to 85m36mPosterior synechiae at 0600   Lens PC IOL in good position, trace Posterior capsular opacification PC IOL in good position  Vitreous Vitreous syneresis Post vitrectomy, gas bubble at 2%       Fundus Exam      Right Left   Disc 360 Peripapillary atrophy 360 Peripapillary atrophy, +cupping   C/D Ratio 0.6 0.5   Macula Flat, Retinal pigment epithelial mottling, Drusen, No heme or edema Flat, mild Retinal pigment epithelial mottling, No heme or edema   Vessels Mild Vascular attenuation Mild Vascular attenuation   Periphery Attached, scattered peripheral drusen / RPE changes, No RT/RD on 360 scleral depressed exam Retina attached; good buckle height, great laser in place 360 on buckle and just posterior to buckle        Refraction    Manifest Refraction      Sphere Cylinder Axis Dist VA   Right -1.75 +2.25 005 20/20-2   Left -4.25 +2.00 160 20/40-2          IMAGING AND PROCEDURES  Imaging and Procedures for 08/13/17  OCT, Retina - OU - Both Eyes       Right Eye Quality was good. Central Foveal Thickness: 261. Progression has been stable. Findings include normal foveal contour, no IRF, no SRF, retinal drusen  (Trace ERM).   Left Eye Quality was good. Central Foveal Thickness: 268. Progression has been stable. Findings include normal foveal contour, no IRF, no SRF, outer retinal atrophy (mild improvement in ellipsoid hyperreflectivity).   Notes *Images captured and stored on drive  Diagnosis / Impression:  OD: NFP, No IRF/SRF; scattered drusen OS: retina / fovea reattached, mild ORA in formerly detached retina, mild improvement in ellipsoid hyperreflectivity  Clinical management:  See below  Abbreviations: NFP - Normal foveal  profile. CME - cystoid macular edema. PED - pigment epithelial detachment. IRF - intraretinal fluid. SRF - subretinal fluid. EZ - ellipsoid zone. ERM - epiretinal membrane. ORA - outer retinal atrophy. ORT - outer retinal tubulation. SRHM - subretinal hyper-reflective material                  ASSESSMENT/PLAN:    ICD-10-CM   1. Left retinal detachment H33.22 OCT, Retina - OU - Both Eyes  2. Retinal edema H35.81 OCT, Retina - OU - Both Eyes  3. Pseudophakia of both eyes Z96.1   4. Primary open angle glaucoma (POAG) of both eyes, mild stage H40.1131     1. Retinal detachment, OS-  - bullous superior detachment from 1030 to 0230, fovea splitting - small breaks ST quad within patch of thin retina - s/p SBP + PPV/PFC/EL/FAX/14% C3F8 OS, 05.02.19             - doing well             - retina reattached and in good position -- good buckle height and laser around breaks  - gas bubble 2%  - BCVA 20/30-1 OS!             - IOP 20 today  - re-start  PF OS QID    Brimonidine OS BID    Cosopt OS BID  - post op drop instructions given  - F/U 1-2 weeks  2. No retinal edema on exam or OCT  3. Pseudophakia OU  - s/p CE/IOL w/ iStent OU by Dr. Cher Nakai  - beautiful surgeries, doing well  - monitor  4. POAG OU - s/p iStents OU as above - IOP remains slightly elevated today -- drops as above - under the expert management of Dr. Cher Nakai  Ophthalmic Meds Ordered this visit:  Meds ordered this  encounter  Medications  . prednisoLONE acetate (PRED FORTE) 1 % ophthalmic suspension    Sig: Place 1 drop into the left eye 4 (four) times daily.    Dispense:  10 mL    Refill:  0  . brimonidine (ALPHAGAN) 0.2 % ophthalmic solution    Sig: Place 1 drop into the left eye 2 (two) times daily.    Dispense:  10 mL    Refill:  1  . dorzolamide-timolol (COSOPT) 22.3-6.8 MG/ML ophthalmic solution    Sig: Place 1 drop into the left eye 2 (two) times daily.    Dispense:  10 mL    Refill:  1        Return in about 2 weeks (around 11/05/2017) for POV.  There are no Patient Instructions on file for this visit.   Explained the diagnoses, plan, and follow up with the patient and they expressed understanding.  Patient expressed understanding of the importance of proper follow up care.   This document serves as a record of services personally performed by Gardiner Sleeper, MD, PhD. It was created on their behalf by Catha Brow, [place], a certified ophthalmic assistant. The creation of this record is the provider's dictation and/or activities during the visit.  Electronically signed by: Catha Brow, COA  07.09.19 10:02 AM   Gardiner Sleeper, M.D., Ph.D. Diseases & Surgery of the Retina and Vitreous [facility]  I have reviewed the above documentation for accuracy and completeness, and I agree with the above. Gardiner Sleeper, M.D., Ph.D. 10/25/17 10:02 AM    Abbreviations: M myopia (nearsighted); A astigmatism; H hyperopia (farsighted); P presbyopia; Mrx spectacle prescription;  CTL contact lenses; OD right eye; OS left eye; OU both eyes  XT exotropia; ET esotropia; PEK punctate epithelial keratitis; PEE punctate epithelial erosions; DES dry eye syndrome; MGD meibomian gland dysfunction; ATs artificial tears; PFAT's preservative free artificial tears; [place] nuclear sclerotic cataract; PSC posterior subcapsular cataract; ERM epi-retinal membrane; PVD posterior vitreous detachment; RD retinal detachment; DM diabetes mellitus; DR diabetic retinopathy; NPDR non-proliferative diabetic retinopathy; PDR proliferative diabetic retinopathy; CSME clinically significant macular edema; DME diabetic macular edema; dbh dot blot hemorrhages; CWS cotton wool spot; POAG primary open angle glaucoma; C/D cup-to-disc ratio; HVF humphrey visual field; GVF goldmann visual field; OCT optical coherence tomography; IOP intraocular pressure; BRVO Branch retinal vein occlusion; CRVO central  retinal vein occlusion; CRAO central retinal artery occlusion; BRAO branch retinal artery occlusion; RT retinal tear; SB scleral buckle; PPV pars plana vitrectomy; VH Vitreous hemorrhage; PRP panretinal laser photocoagulation; IVK intravitreal kenalog; VMT vitreomacular traction; MH Macular hole;  NVD neovascularization of the disc; NVE neovascularization elsewhere; AREDS age related eye disease study; ARMD age related macular degeneration; POAG primary open angle glaucoma; EBMD epithelial/anterior basement membrane dystrophy; ACIOL anterior chamber intraocular lens; IOL intraocular lens; PCIOL posterior chamber intraocular lens; Phaco/IOL phacoemulsification with intraocular lens placement; [place] photorefractive keratectomy; LASIK laser assisted in situ keratomileusis; HTN hypertension; DM diabetes mellitus; COPD chronic obstructive pulmonary disease

## 2017-10-22 ENCOUNTER — Ambulatory Visit (INDEPENDENT_AMBULATORY_CARE_PROVIDER_SITE_OTHER): Payer: Medicare HMO | Admitting: Ophthalmology

## 2017-10-22 ENCOUNTER — Encounter (INDEPENDENT_AMBULATORY_CARE_PROVIDER_SITE_OTHER): Payer: Self-pay | Admitting: Ophthalmology

## 2017-10-22 DIAGNOSIS — H3581 Retinal edema: Secondary | ICD-10-CM | POA: Diagnosis not present

## 2017-10-22 DIAGNOSIS — H401131 Primary open-angle glaucoma, bilateral, mild stage: Secondary | ICD-10-CM

## 2017-10-22 DIAGNOSIS — Z961 Presence of intraocular lens: Secondary | ICD-10-CM

## 2017-10-22 DIAGNOSIS — H3322 Serous retinal detachment, left eye: Secondary | ICD-10-CM | POA: Diagnosis not present

## 2017-10-22 MED ORDER — PREDNISOLONE ACETATE 1 % OP SUSP: 1.0000 [drp] | Freq: Four times a day (QID) | OPHTHALMIC | 0 refills | Status: DC

## 2017-10-22 MED ORDER — DORZOLAMIDE HCL-TIMOLOL MAL 2-0.5 % OP SOLN: 1.0000 [drp] | Freq: Two times a day (BID) | OPHTHALMIC | 1 refills | Status: DC

## 2017-10-22 MED ORDER — BRIMONIDINE TARTRATE 0.2 % OP SOLN: 1.0000 [drp] | Freq: Two times a day (BID) | OPHTHALMIC | 1 refills | Status: DC

## 2017-10-25 ENCOUNTER — Encounter (INDEPENDENT_AMBULATORY_CARE_PROVIDER_SITE_OTHER): Payer: Self-pay | Admitting: Ophthalmology

## 2017-10-28 ENCOUNTER — Encounter (INDEPENDENT_AMBULATORY_CARE_PROVIDER_SITE_OTHER): Payer: Self-pay | Admitting: Ophthalmology

## 2017-11-05 ENCOUNTER — Encounter (INDEPENDENT_AMBULATORY_CARE_PROVIDER_SITE_OTHER): Payer: Medicare HMO | Admitting: Ophthalmology

## 2017-11-11 NOTE — Progress Notes (Signed)
[facility] Note  11/12/2017     CHIEF COMPLAINT Patient presents for Post-op Follow-up   HISTORY OF PRESENT ILLNESS: Jeffery George is a 72 y.o. male who presents to the clinic today for:   HPI    Post-op Follow-up    In left eye.  Discomfort includes none.  Negative for pain, itching, foreign body sensation, tearing, discharge and floaters.  Vision is improved.  I, the attending physician,  performed the HPI with the patient and updated documentation appropriately.          Comments    72 y/o male pt returning to clinic today for 2 wk po.  Pt is s/p PPV and SB OS on 05.02.19.  VA continues to improve OS.  Gas bubble is now gone.  Intermittent irritation OS.  No change in vision OD.  Denies pain, flashes.   PF QID OS Brimonidine BID OS Cosopt BID OS       Last edited by Bernarda Caffey, MD on 11/14/2017 11:29 PM. (History)      Referring physician: [name], MD [place], [place] [address]  HISTORICAL INFORMATION:   Selected notes from the MEDICAL RECORD NUMBER Referred by Dr. Parke Simmers for possible RD OS LEE- 05.01.19  Ocular Hx- pseudophakia OU with dual I-stent (OD 02.11.19 OS 02.25.19 with Dr. Cher Nakai),  PMH- DM, heart murmur    CURRENT MEDICATIONS: Current Outpatient Medications (Ophthalmic Drugs)  Medication Sig  . bacitracin-polymyxin b (POLYSPORIN) ophthalmic ointment Place into the left eye 4 (four) times daily. Place a 1/2 inch ribbon of ointment into the lower eyelid.  Marland Kitchen brimonidine (ALPHAGAN) 0.2 % ophthalmic solution Place 1 drop into the left eye 2 (two) times daily.  . dorzolamide-timolol (COSOPT) 22.3-6.8 MG/ML ophthalmic solution Place 1 drop into the left eye 2 (two) times daily.  Marland Kitchen latanoprost (XALATAN) 0.005 % ophthalmic solution Place 1 drop into both eyes every evening.   . prednisoLONE acetate (PRED FORTE) 1 % ophthalmic suspension Place 1 drop into the left eye 4 (four) times daily.   No current  facility-administered medications for this visit.  (Ophthalmic Drugs)   Current Outpatient Medications (Other)  Medication Sig  . ACCU-CHEK SOFTCLIX LANCETS lancets Test blood sugar once daily. Dx E11.9  . amLODipine (NORVASC) 5 MG tablet TAKE 1 TABLET DAILY  . aspirin EC 81 MG tablet Take 1 tablet (81 mg total) by mouth daily.  Marland Kitchen atorvastatin (LIPITOR) 10 MG tablet Take 1 tablet (10 mg total) by mouth daily.  . blood glucose meter kit and supplies TrueMetrix, TruTrack, Accu-Chek Aviva Expert, Accu-Chek Aviva Plus, Accu-Chek Guide, Accu-Chek Nano, or Accu-Chek Smartview.   Use once per day.  . Blood Glucose Monitoring Suppl (BLOOD GLUCOSE MONITOR KIT) KIT Use to test blood sugar twice daily.  . colchicine (COLCRYS) 0.6 MG tablet Take 2 tabs po at symptom onset. Repeat 1 tab po 1 hour later.  Marland Kitchen glucose blood (ONE TOUCH ULTRA TEST) test strip 1 each by Other route daily. Use as instructed  . HYDROcodone-acetaminophen (NORCO/VICODIN) 5-325 MG tablet Take 1 tablet by mouth every 4 (four) hours as needed for moderate pain.  . indomethacin (INDOCIN) 50 MG capsule TAKE 1 CAPSULE THREE TIMES A DAY AS NEEDED FOR MODERATE PAIN  . Lancets (ONETOUCH ULTRASOFT) lancets Use as instructed  . losartan (COZAAR) 100 MG tablet Take 1 tablet (100 mg total) by mouth daily.  . metFORMIN (GLUCOPHAGE) 500 MG tablet Take 1 tablet (500 mg total)  by mouth daily with breakfast.  . Omega-3 Fatty Acids (FISH OIL) 1000 MG CAPS Take by mouth daily.    No current facility-administered medications for this visit.  (Other)      REVIEW OF SYSTEMS: ROS    Positive for: Eyes   Negative for: Constitutional, Gastrointestinal, Neurological, Skin, Genitourinary, Musculoskeletal, HENT, Endocrine, Cardiovascular, Respiratory, Psychiatric, Allergic/Imm, Heme/Lymph   Last edited by Matthew Folks, COA on 11/12/2017  9:48 AM. (History)       ALLERGIES No Known Allergies  PAST MEDICAL HISTORY Past Medical History:   Diagnosis Date  . Arthritis   . Diabetes mellitus without complication ([place])   . Heart murmur   . Hypertension    Past Surgical History:  Procedure Laterality Date  . CATARACT EXTRACTION    . FRACTURE SURGERY     Lt ankle  . GAS/FLUID EXCHANGE Left 08/12/2017   Procedure: C3F8 GAS/FLUID EXCHANGE;  Surgeon: Bernarda Caffey, MD;  Location: [facility];  Service: Ophthalmology;  Laterality: Left;  . HERNIA REPAIR    . IRIDOTOMY / IRIDECTOMY Bilateral    stents  . VASECTOMY    . VITRECTOMY 25 GAUGE WITH SCLERAL BUCKLE Left 08/12/2017   Procedure: VITRECTOMY 25 GAUGE WITH SCLERAL BUCKLE WITH ENDOLASER;  Surgeon: Bernarda Caffey, MD;  Location: [facility];  Service: Ophthalmology;  Laterality: Left;    FAMILY HISTORY Family History  Problem Relation Age of Onset  . Cancer Mother   . Diabetes Mother   . Stroke Mother   . Heart disease Father   . Stroke Father   . Diabetes Brother   . Heart disease Brother   . Diabetes Brother   . Colon cancer Neg Hx     SOCIAL HISTORY Social History   Tobacco Use  . Smoking status: Former Research scientist (life sciences)  . Smokeless tobacco: Never Used  Substance Use Topics  . Alcohol use: No    Alcohol/week: 0.0 oz  . Drug use: No         OPHTHALMIC EXAM:  Base Eye Exam    Visual Acuity (Snellen - Linear)      Right Left   Dist [place]  20/150   Dist cc 20/25 -    Dist ph [place]  20/40 -   Dist ph cc 20/20 -2    Correction:  Glasses  Pt states VA OS is a little better w/o glasses.       Tonometry (Tonopen, 10:04 AM)      Right Left   Pressure 10 15       Pupils      Dark Light Shape React APD   Right 4 3 Round Brisk None   Left 7 7 Round No None       Visual Fields (Counting fingers)      Left Right    Full Full       Extraocular Movement      Right Left    Full, Ortho Full, Ortho       Neuro/Psych    Oriented x3:  Yes   Mood/Affect:  Normal       Dilation    Left eye:  1.0% Mydriacyl, 2.5% Phenylephrine @ 10:03 AM        Slit Lamp and Fundus  Exam    Slit Lamp Exam      Right Left   Lids/Lashes Dermatochalasis - upper lid Dermatochalasis - upper lid, peri orbital edema, Ecchymosis   Conjunctiva/Sclera White and quiet White and quiet   Cornea Arcus,  Well healed cataract wounds Arcus, Well healed cataract wounds   Anterior Chamber Deep and quiet, stent at 0230 and 0300 angle  Deep and quiet   Iris Round and dilated to 7.67m Round and dilated to 822m Posterior synechiae at 0600   Lens PC IOL in good position, trace Posterior capsular opacification PC IOL in good position   Vitreous Vitreous syneresis Post vitrectomy, gas bubble gone       Fundus Exam      Right Left   Disc 360 Peripapillary atrophy 360 Peripapillary atrophy, +cupping   C/D Ratio 0.6 0.5   Macula Blunted foveal reflex, Flat, Retinal pigment epithelial mottling, Drusen, Epiretinal membrane, No heme or edema Flat, mild Retinal pigment epithelial mottling, No heme or edema   Vessels Mild Vascular attenuation Mild Vascular attenuation   Periphery Attached, scattered peripheral drusen / RPE changes, No RT/RD on 360 scleral depressed exam Retina attached; good buckle height, great laser in place 360 on buckle and just posterior to buckle          IMAGING AND PROCEDURES  Imaging and Procedures for 08/13/17  OCT, Retina - OU - Both Eyes       Right Eye Quality was good. Central Foveal Thickness: 261. Progression has been stable. Findings include normal foveal contour, no IRF, no SRF, retinal drusen  (Trace ERM).   Left Eye Quality was good. Central Foveal Thickness: 267. Progression has been stable. Findings include normal foveal contour, no IRF, no SRF, outer retinal atrophy, epiretinal membrane (mild improvement in ellipsoid hyperreflectivity).   Notes *Images captured and stored on drive  Diagnosis / Impression:  OD: NFP, No IRF/SRF; scattered drusen OS: retina / fovea reattached, mild ORA in formerly detached retina, mild improvement in ellipsoid  hyperreflectivity  Clinical management:  See below  Abbreviations: NFP - Normal foveal profile. CME - cystoid macular edema. PED - pigment epithelial detachment. IRF - intraretinal fluid. SRF - subretinal fluid. EZ - ellipsoid zone. ERM - epiretinal membrane. ORA - outer retinal atrophy. ORT - outer retinal tubulation. SRHM - subretinal hyper-reflective material                  ASSESSMENT/PLAN:    ICD-10-CM   1. Left retinal detachment H33.22 OCT, Retina - OU - Both Eyes  2. Retinal edema H35.81 OCT, Retina - OU - Both Eyes  3. Pseudophakia of both eyes Z96.1   4. Primary open angle glaucoma (POAG) of both eyes, mild stage H40.1131     1. Retinal detachment, OS-  - bullous superior detachment from 1030 to 0230, fovea splitting - small breaks ST quad within patch of thin retina - s/p SBP + PPV/PFC/EL/FAX/14% C3F8 OS, 05.02.19             - doing well             - retina reattached and in good position -- good buckle height and laser around breaks  - gas bubble gone  - BCVA 20/40 today             - IOP 15 today  - start PF taper - 4, 3, 2, 1 decrease weekly  - STOP Brimonidine OS BID    - cont Cosopt OS BID  - post op drop instructions given  - F/U 4-6 weeks  2. No retinal edema on exam or OCT  3. Pseudophakia OU  - s/p CE/IOL w/ iStent OU by Dr. BoCher Nakai- beautiful surgeries, doing well  - monitor  4. POAG OU - s/p iStents OU as above - IOP improved today -- drops as above - under the expert management of Dr. Cher Nakai  Ophthalmic Meds Ordered this visit:  No orders of the defined types were placed in this encounter.      Return in about 6 weeks (around 12/24/2017) for F/U RD rep OS, DFE, OCT.  There are no Patient Instructions on file for this visit.   Explained the diagnoses, plan, and follow up with the patient and they expressed understanding.  Patient expressed understanding of the importance of proper follow up care.   This document serves as  a record of services personally performed by Gardiner Sleeper, MD, PhD. It was created on their behalf by Catha Brow, [place], a certified ophthalmic assistant. The creation of this record is the provider's dictation and/or activities during the visit.  Electronically signed by: Catha Brow, [place]  08.01.19 11:29 PM   Gardiner Sleeper, M.D., Ph.D. Diseases & Surgery of the Retina and Vitreous [facility]  I have reviewed the above documentation for accuracy and completeness, and I agree with the above. Gardiner Sleeper, M.D., Ph.D. 11/14/17 11:31 PM   Abbreviations: M myopia (nearsighted); A astigmatism; H hyperopia (farsighted); P presbyopia; Mrx spectacle prescription;  CTL contact lenses; OD right eye; OS left eye; OU both eyes  XT exotropia; ET esotropia; PEK punctate epithelial keratitis; PEE punctate epithelial erosions; DES dry eye syndrome; MGD meibomian gland dysfunction; ATs artificial tears; PFAT's preservative free artificial tears; [place] nuclear sclerotic cataract; PSC posterior subcapsular cataract; ERM epi-retinal membrane; PVD posterior vitreous detachment; RD retinal detachment; DM diabetes mellitus; DR diabetic retinopathy; NPDR non-proliferative diabetic retinopathy; PDR proliferative diabetic retinopathy; CSME clinically significant macular edema; DME diabetic macular edema; dbh dot blot hemorrhages; CWS cotton wool spot; POAG primary open angle glaucoma; C/D cup-to-disc ratio; HVF humphrey visual field; GVF goldmann visual field; OCT optical coherence tomography; IOP intraocular pressure; BRVO Branch retinal vein occlusion; CRVO central retinal vein occlusion; CRAO central retinal artery occlusion; BRAO branch retinal artery occlusion; RT retinal tear; SB scleral buckle; PPV pars plana vitrectomy; VH Vitreous hemorrhage; PRP panretinal laser photocoagulation; IVK intravitreal kenalog; VMT vitreomacular traction; MH Macular hole;  NVD neovascularization of the  disc; NVE neovascularization elsewhere; AREDS age related eye disease study; ARMD age related macular degeneration; POAG primary open angle glaucoma; EBMD epithelial/anterior basement membrane dystrophy; ACIOL anterior chamber intraocular lens; IOL intraocular lens; PCIOL posterior chamber intraocular lens; Phaco/IOL phacoemulsification with intraocular lens placement; [place] photorefractive keratectomy; LASIK laser assisted in situ keratomileusis; HTN hypertension; DM diabetes mellitus; COPD chronic obstructive pulmonary disease

## 2017-11-12 ENCOUNTER — Ambulatory Visit (INDEPENDENT_AMBULATORY_CARE_PROVIDER_SITE_OTHER): Payer: Medicare HMO | Admitting: Ophthalmology

## 2017-11-12 ENCOUNTER — Encounter (INDEPENDENT_AMBULATORY_CARE_PROVIDER_SITE_OTHER): Payer: Self-pay | Admitting: Ophthalmology

## 2017-11-12 DIAGNOSIS — H3581 Retinal edema: Secondary | ICD-10-CM | POA: Diagnosis not present

## 2017-11-12 DIAGNOSIS — Z961 Presence of intraocular lens: Secondary | ICD-10-CM | POA: Diagnosis not present

## 2017-11-12 DIAGNOSIS — H401131 Primary open-angle glaucoma, bilateral, mild stage: Secondary | ICD-10-CM

## 2017-11-12 DIAGNOSIS — H3322 Serous retinal detachment, left eye: Secondary | ICD-10-CM

## 2017-11-14 ENCOUNTER — Encounter (INDEPENDENT_AMBULATORY_CARE_PROVIDER_SITE_OTHER): Payer: Self-pay | Admitting: Ophthalmology

## 2017-12-23 ENCOUNTER — Ambulatory Visit
Admission: RE | Admit: 2017-12-23 | Discharge: 2017-12-23 | Disposition: A | Discharge status: Home / Self Care | Payer: Medicare HMO | Source: Ambulatory Visit | Attending: Family Medicine | Admitting: Family Medicine

## 2017-12-23 ENCOUNTER — Encounter: Payer: Self-pay | Admitting: Family Medicine

## 2017-12-23 ENCOUNTER — Ambulatory Visit (INDEPENDENT_AMBULATORY_CARE_PROVIDER_SITE_OTHER): Payer: Medicare HMO | Admitting: Family Medicine

## 2017-12-23 ENCOUNTER — Other Ambulatory Visit: Payer: Self-pay

## 2017-12-23 VITALS — BP 146/78 | HR 63 | Temp 98.0°F | Ht 71.0 in | Wt 261.6 lb

## 2017-12-23 DIAGNOSIS — R0789 Other chest pain: Secondary | ICD-10-CM

## 2017-12-23 DIAGNOSIS — J069 Acute upper respiratory infection, unspecified: Secondary | ICD-10-CM | POA: Diagnosis not present

## 2017-12-23 DIAGNOSIS — Z8249 Family history of ischemic heart disease and other diseases of the circulatory system: Secondary | ICD-10-CM | POA: Diagnosis not present

## 2017-12-23 DIAGNOSIS — R0602 Shortness of breath: Secondary | ICD-10-CM | POA: Diagnosis not present

## 2017-12-23 DIAGNOSIS — R0989 Other specified symptoms and signs involving the circulatory and respiratory systems: Secondary | ICD-10-CM | POA: Diagnosis not present

## 2017-12-23 DIAGNOSIS — E785 Hyperlipidemia, unspecified: Secondary | ICD-10-CM | POA: Diagnosis not present

## 2017-12-23 DIAGNOSIS — E119 Type 2 diabetes mellitus without complications: Secondary | ICD-10-CM

## 2017-12-23 DIAGNOSIS — I10 Essential (primary) hypertension: Secondary | ICD-10-CM

## 2017-12-23 DIAGNOSIS — M109 Gout, unspecified: Secondary | ICD-10-CM | POA: Diagnosis not present

## 2017-12-23 DIAGNOSIS — R011 Cardiac murmur, unspecified: Secondary | ICD-10-CM

## 2017-12-23 LAB — COMPREHENSIVE METABOLIC PANEL
ALT: 34 IU/L (ref 0–44)
AST: 22 IU/L (ref 0–40)
Albumin/Globulin Ratio: 1.8 (ref 1.2–2.2)
Albumin: 4.5 g/dL (ref 3.5–4.8)
Alkaline Phosphatase: 56 IU/L (ref 39–117)
BUN / CREAT RATIO: 25 — AB (ref 10–24)
BUN: 21 mg/dL (ref 8–27)
Bilirubin Total: 0.6 mg/dL (ref 0.0–1.2)
CO2: 22 mmol/L (ref 20–29)
CREATININE: 0.84 mg/dL (ref 0.76–1.27)
Calcium: 9.4 mg/dL (ref 8.6–10.2)
Chloride: 102 mmol/L (ref 96–106)
GFR calc Af Amer: 102 mL/min/{1.73_m2} (ref >59)
GFR calc non Af Amer: 88 mL/min/{1.73_m2} (ref >59)
GLUCOSE: 143 mg/dL — AB (ref 65–99)
Globulin, Total: 2.5 g/dL (ref 1.5–4.5)
POTASSIUM: 4.2 mmol/L (ref 3.5–5.2)
SODIUM: 137 mmol/L (ref 134–144)
Total Protein: 7 g/dL (ref 6.0–8.5)

## 2017-12-23 LAB — LIPID PANEL
Chol/HDL Ratio: 3.1 ratio (ref 0.0–5.0)
Cholesterol, Total: 110 mg/dL (ref 100–199)
HDL: 36 mg/dL — ABNORMAL LOW (ref >39)
LDL Calculated: 46 mg/dL (ref 0–99)
TRIGLYCERIDES: 142 mg/dL (ref 0–149)
VLDL Cholesterol Cal: 28 mg/dL (ref 5–40)

## 2017-12-23 LAB — HEMOGLOBIN A1C
Est. average glucose Bld gHb Est-mCnc: 140 mg/dL
HEMOGLOBIN A1C: 6.5 % — AB (ref 4.8–5.6)

## 2017-12-23 MED ORDER — ATORVASTATIN CALCIUM 10 MG PO TABS: 10.0000 mg | ORAL_TABLET | Freq: Every day | ORAL | 2 refills | Status: DC

## 2017-12-23 MED ORDER — METFORMIN HCL 500 MG PO TABS: 500.0000 mg | ORAL_TABLET | Freq: Every day | ORAL | 2 refills | Status: DC

## 2017-12-23 MED ORDER — AMLODIPINE BESYLATE 5 MG PO TABS: ORAL_TABLET | ORAL | 2 refills | Status: DC

## 2017-12-23 MED ORDER — LOSARTAN POTASSIUM 100 MG PO TABS: 100.0000 mg | ORAL_TABLET | Freq: Every day | ORAL | 2 refills | Status: DC

## 2017-12-23 NOTE — Progress Notes (Signed)
[facility] Note  12/24/2017     CHIEF COMPLAINT Patient presents for Retina Follow Up   HISTORY OF PRESENT ILLNESS: Jeffery George is a 72 y.o. male who presents to the clinic today for:   HPI    Retina Follow Up    Patient presents with  Retinal Break/Detachment.  In left eye.  This started 4 months ago.  Severity is mild.  Since onset it is stable.  I, the attending physician,  performed the HPI with the patient and updated documentation appropriately.          Comments    POV RD repair Os(08/12/17). Patient states he occasionally has floaters OS, Denies flashes and ocular pain. Bs 123 this am BS WINL per patient.Pt continues to use gtt's as instructed       Last edited by Bernarda Caffey, MD on 12/24/2017 12:59 PM. (History)      Referring physician: [name], MD [place], [place] [address]  HISTORICAL INFORMATION:   Selected notes from the MEDICAL RECORD NUMBER Referred by Dr. Parke Simmers for possible RD OS LEE- 05.01.19  Ocular Hx- pseudophakia OU with dual I-stent (OD 02.11.19 OS 02.25.19 with Dr. Cher Nakai),  PMH- DM, heart murmur    CURRENT MEDICATIONS: Current Outpatient Medications (Ophthalmic Drugs)  Medication Sig  . brimonidine (ALPHAGAN) 0.2 % ophthalmic solution Place 1 drop into the left eye 2 (two) times daily.  Marland Kitchen latanoprost (XALATAN) 0.005 % ophthalmic solution Place 1 drop into both eyes every evening.   . dorzolamide-timolol (COSOPT) 22.3-6.8 MG/ML ophthalmic solution Place 1 drop into the left eye 2 (two) times daily.   No current facility-administered medications for this visit.  (Ophthalmic Drugs)   Current Outpatient Medications (Other)  Medication Sig  . ACCU-CHEK SOFTCLIX LANCETS lancets Test blood sugar once daily. Dx E11.9  . amLODipine (NORVASC) 5 MG tablet TAKE 1 TABLET DAILY  . aspirin EC 81 MG tablet Take 1 tablet (81 mg total) by mouth daily.  Marland Kitchen atorvastatin (LIPITOR) 10 MG tablet Take 1  tablet (10 mg total) by mouth daily.  . blood glucose meter kit and supplies TrueMetrix, TruTrack, Accu-Chek Aviva Expert, Accu-Chek Aviva Plus, Accu-Chek Guide, Accu-Chek Nano, or Accu-Chek Smartview.   Use once per day.  . Blood Glucose Monitoring Suppl (BLOOD GLUCOSE MONITOR KIT) KIT Use to test blood sugar twice daily.  . colchicine (COLCRYS) 0.6 MG tablet Take 2 tabs po at symptom onset. Repeat 1 tab po 1 hour later.  Marland Kitchen glucose blood (ONE TOUCH ULTRA TEST) test strip 1 each by Other route daily. Use as instructed  . indomethacin (INDOCIN) 50 MG capsule TAKE 1 CAPSULE THREE TIMES A DAY AS NEEDED FOR MODERATE PAIN  . Lancets (ONETOUCH ULTRASOFT) lancets Use as instructed  . losartan (COZAAR) 100 MG tablet Take 1 tablet (100 mg total) by mouth daily.  . metFORMIN (GLUCOPHAGE) 500 MG tablet Take 1 tablet (500 mg total) by mouth daily with breakfast.  . Omega-3 Fatty Acids (FISH OIL) 1000 MG CAPS Take by mouth daily.    No current facility-administered medications for this visit.  (Other)      REVIEW OF SYSTEMS: ROS    Positive for: Endocrine, Eyes   Negative for: Constitutional, Gastrointestinal, Neurological, Skin, Genitourinary, Musculoskeletal, HENT, Cardiovascular, Respiratory, Psychiatric, Allergic/Imm, Heme/Lymph   Last edited by Zenovia Jordan, LPN on 09/08/4130 44:01 AM. (History)       ALLERGIES No Known Allergies  PAST MEDICAL HISTORY Past Medical History:  Diagnosis Date  . Arthritis   . Diabetes mellitus without complication ([place])   . Heart murmur   . Hypertension    Past Surgical History:  Procedure Laterality Date  . CATARACT EXTRACTION    . FRACTURE SURGERY     Lt ankle  . GAS/FLUID EXCHANGE Left 08/12/2017   Procedure: C3F8 GAS/FLUID EXCHANGE;  Surgeon: Bernarda Caffey, MD;  Location: [facility];  Service: Ophthalmology;  Laterality: Left;  . HERNIA REPAIR    . IRIDOTOMY / IRIDECTOMY Bilateral    stents  . VASECTOMY    . VITRECTOMY 25 GAUGE WITH SCLERAL  BUCKLE Left 08/12/2017   Procedure: VITRECTOMY 25 GAUGE WITH SCLERAL BUCKLE WITH ENDOLASER;  Surgeon: Bernarda Caffey, MD;  Location: [facility];  Service: Ophthalmology;  Laterality: Left;    FAMILY HISTORY Family History  Problem Relation Age of Onset  . Cancer Mother   . Diabetes Mother   . Stroke Mother   . Heart disease Father   . Stroke Father   . Diabetes Brother   . Heart disease Brother   . Diabetes Brother   . Colon cancer Neg Hx     SOCIAL HISTORY Social History   Tobacco Use  . Smoking status: Former Research scientist (life sciences)  . Smokeless tobacco: Never Used  Substance Use Topics  . Alcohol use: No    Alcohol/week: 0.0 standard drinks  . Drug use: No         OPHTHALMIC EXAM:  Base Eye Exam    Visual Acuity (Snellen - Linear)      Right Left   Dist cc 20/20 20/250   Dist ph cc NI 20/50 +2   Correction:  Glasses       Tonometry (Tonopen, 10:29 AM)      Right Left   Pressure 16 19       Pupils      Dark Light Shape React APD   Right 4 3 Round Brisk None   Left 4 3 Round Brisk None       Visual Fields (Counting fingers)      Left Right     Full   Restrictions Partial outer inferior temporal, inferior nasal deficiencies        Extraocular Movement      Right Left    Full, Ortho Full, Ortho       Neuro/Psych    Oriented x3:  Yes   Mood/Affect:  Normal       Dilation    Both eyes:  1.0% Mydriacyl, 2.5% Phenylephrine @ 10:29 AM        Slit Lamp and Fundus Exam    Slit Lamp Exam      Right Left   Lids/Lashes Dermatochalasis - upper lid Dermatochalasis - upper lid, peri orbital edema, Ecchymosis   Conjunctiva/Sclera White and quiet White and quiet   Cornea Arcus, Well healed cataract wounds Arcus, Well healed cataract wounds   Anterior Chamber Deep and quiet, stent at 0230 and 0300 angle  Deep and quiet   Iris Round and dilated to 7.28m Round and dilated to 883m Posterior synechiae at 0600   Lens PC IOL in good position, trace Posterior capsular opacification  PC IOL in good position   Vitreous Vitreous syneresis Post vitrectomy       Fundus Exam      Right Left   Disc 360 Peripapillary atrophy 360 Peripapillary atrophy, +cupping   C/D Ratio 0.6 0.6   Macula Blunted foveal reflex, Flat, Retinal pigment epithelial mottling, Drusen,  Epiretinal membrane, No heme or edema Flat, mild Retinal pigment epithelial mottling, No heme or edema   Vessels Mild Vascular attenuation Mild Vascular attenuation   Periphery Attached, scattered peripheral drusen / RPE changes, No RT/RD on 360 scleral depressed exam Retina attached; good buckle height, great laser in place 360 on buckle and just posterior to buckle          IMAGING AND PROCEDURES  Imaging and Procedures for 08/13/17  OCT, Retina - OU - Both Eyes       Right Eye Quality was good. Central Foveal Thickness: 263. Progression has been stable. Findings include normal foveal contour, no IRF, no SRF, retinal drusen  (Trace ERM).   Left Eye Quality was good. Central Foveal Thickness: 272. Progression has been stable. Findings include normal foveal contour, no IRF, no SRF, outer retinal atrophy, epiretinal membrane (interval improvement in ellipsoid hyperreflectivity).   Notes *Images captured and stored on drive  Diagnosis / Impression:  OD: NFP, No IRF/SRF; scattered drusen OS: retina / fovea reattached, mild ORA in formerly detached retina, mild interval improvement in ellipsoid hyperreflectivity  Clinical management:  See below  Abbreviations: NFP - Normal foveal profile. CME - cystoid macular edema. PED - pigment epithelial detachment. IRF - intraretinal fluid. SRF - subretinal fluid. EZ - ellipsoid zone. ERM - epiretinal membrane. ORA - outer retinal atrophy. ORT - outer retinal tubulation. SRHM - subretinal hyper-reflective material                  ASSESSMENT/PLAN:    ICD-10-CM   1. Left retinal detachment H33.22 OCT, Retina - OU - Both Eyes  2. Retinal edema H35.81 OCT,  Retina - OU - Both Eyes  3. Pseudophakia of both eyes Z96.1   4. Primary open angle glaucoma (POAG) of both eyes, mild stage H40.1131     1. Retinal detachment, OS-  - bullous superior detachment from 1030 to 0230, fovea splitting - small breaks ST quad within patch of thin retina - s/p SBP + PPV/PFC/EL/FAX/14% C3F8 OS, 05.02.19             - doing well             - retina reattached and in good position -- good buckle height and laser around breaks  - gas bubble gone  - BCVA 20/50 today             - IOP 19 today  - finished PF taper  - cont Cosopt OS BID  - post op drop instructions given  - F/U 6-9 months  2. No retinal edema on exam or OCT  3. Pseudophakia OU  - s/p CE/IOL w/ iStent OU by Dr. Cher Nakai  - beautiful surgeries, doing well  - monitor  4. POAG OU - s/p iStents OU as above - IOP improved today -- drops as above - under the expert management of Dr. Cher Nakai  Ophthalmic Meds Ordered this visit:  Meds ordered this encounter  Medications  . dorzolamide-timolol (COSOPT) 22.3-6.8 MG/ML ophthalmic solution    Sig: Place 1 drop into the left eye 2 (two) times daily.    Dispense:  10 mL    Refill:  5       Return in about 9 months (around 09/24/2018) for F/U RD repair OS, DFE, OCT.  There are no Patient Instructions on file for this visit.   Explained the diagnoses, plan, and follow up with the patient and they expressed understanding.  Patient expressed understanding of  the importance of proper follow up care.   This document serves as a record of services personally performed by Gardiner Sleeper, MD, PhD. It was created on their behalf by Ernest Mallick, OA, an ophthalmic assistant. The creation of this record is the provider's dictation and/or activities during the visit.    Electronically signed by: Ernest Mallick, OA  09.12.2019 12:42 AM    Gardiner Sleeper, M.D., Ph.D. Diseases & Surgery of the Retina and Vitreous [facility]  I  have reviewed the above documentation for accuracy and completeness, and I agree with the above. Gardiner Sleeper, M.D., Ph.D. 12/26/17 12:43 AM    Abbreviations: M myopia (nearsighted); A astigmatism; H hyperopia (farsighted); P presbyopia; Mrx spectacle prescription;  CTL contact lenses; OD right eye; OS left eye; OU both eyes  XT exotropia; ET esotropia; PEK punctate epithelial keratitis; PEE punctate epithelial erosions; DES dry eye syndrome; MGD meibomian gland dysfunction; ATs artificial tears; PFAT's preservative free artificial tears; [place] nuclear sclerotic cataract; PSC posterior subcapsular cataract; ERM epi-retinal membrane; PVD posterior vitreous detachment; RD retinal detachment; DM diabetes mellitus; DR diabetic retinopathy; NPDR non-proliferative diabetic retinopathy; PDR proliferative diabetic retinopathy; CSME clinically significant macular edema; DME diabetic macular edema; dbh dot blot hemorrhages; CWS cotton wool spot; POAG primary open angle glaucoma; C/D cup-to-disc ratio; HVF humphrey visual field; GVF goldmann visual field; OCT optical coherence tomography; IOP intraocular pressure; BRVO Branch retinal vein occlusion; CRVO central retinal vein occlusion; CRAO central retinal artery occlusion; BRAO branch retinal artery occlusion; RT retinal tear; SB scleral buckle; PPV pars plana vitrectomy; VH Vitreous hemorrhage; PRP panretinal laser photocoagulation; IVK intravitreal kenalog; VMT vitreomacular traction; MH Macular hole;  NVD neovascularization of the disc; NVE neovascularization elsewhere; AREDS age related eye disease study; ARMD age related macular degeneration; POAG primary open angle glaucoma; EBMD epithelial/anterior basement membrane dystrophy; ACIOL anterior chamber intraocular lens; IOL intraocular lens; PCIOL posterior chamber intraocular lens; Phaco/IOL phacoemulsification with intraocular lens placement; [place] photorefractive keratectomy; LASIK laser assisted in situ  keratomileusis; HTN hypertension; DM diabetes mellitus; COPD chronic obstructive pulmonary disease

## 2017-12-23 NOTE — Progress Notes (Addendum)
Subjective:  By signing my name below, I, [name], attest that this documentation has been prepared under the direction and in the presence of Jeffery Agreste, MD Electronically Signed: Ladene Artist, ED Scribe 12/23/2017 at 8:48 AM.   Patient ID: Jeffery George, male    DOB: [birth year], 72 y.o.   MRN: [id number]  Chief Complaint  Patient presents with  . Diabetes    [facility] for long term meds, [facility] or local need now meds)  . Hypertension    6 m f/u   . Hyperlipidemia   HPI Jeffery George is a 72 y.o. male who presents to Primary Care at [facility] for f/u.  DM Lab Results  Component Value Date   HGBA1C 6.5 (H) 06/22/2017   Wt Readings from Last 3 Encounters:  12/23/17 261 lb 9.6 oz (118.7 kg)  08/12/17 259 lb 0.7 oz (117.5 kg)  06/22/17 260 lb (117.9 kg)  Metformin 500 mg qd. On statin, ARB and asa. Optho 05/17/17. Under care of optho for retinal detachment in May, most recent [place] 8/2, next appointment tomorrow. Foot: 12/23/17. Pneumonia vaccine: 2017. - Home blood glucose readings ranging 112-122. Denies new side-effects.  Hyperlipidemia Lab Results  Component Value Date   CHOL 115 02/25/2017   HDL 47 02/25/2017   LDLCALC 52 02/25/2017   TRIG 79 02/25/2017   CHOLHDL 2.4 02/25/2017   Lab Results  Component Value Date   ALT 26 02/25/2017   AST 16 02/25/2017   ALKPHOS 48 02/25/2017   BILITOT 0.6 02/25/2017  Lipitor 10 mg qd. - Denies new myalgias, arthralgias, other side-effects.  HTN Lab Results  Component Value Date   CREATININE 0.91 08/12/2017   BP Readings from Last 3 Encounters:  12/23/17 (!) 146/78  08/12/17 138/77  06/22/17 137/80  Norvasc 5 mg qd, losartan 100 mg qd. - Pt does not have a home BP monitor or check his BP outside of the office. Denies new side-effects.  Gout Lab Results  Component Value Date   LABURIC 6.1 02/25/2017  Colchicine or indomethacin prn. - Pt does not recall last gout flare; states it has been sev yrs. States he used to take  indomethacin for rare ankle pain.  Congestion Pt reports nasal congestion x 10 days. He has taken NyQuil for 2 few days which he stopped 2 days ago. He has noticed rhinorrhea which improved, some gradually improving sob and chest discomfort, similar to heartburn, 1-2 wks ago that has since resolved. Reports that he was getting winded quickly which he suspects is due to deconditioning from eye surgery. Denies fever, recent cp or sob, cp or sob on exertion. States he never scheduled an echo for heart murmur due to eye surgery. He has not seen cardiology for heart murmur in the past. Pt reports that his father, 2 brothers and sister have a h/o cardiac issues including enlarged heart, PACE maker and hospitalization for a few days due to their heart (he is unsure of actual diagnosis).  Patient Active Problem List   Diagnosis Date Noted  . Left retinal detachment 08/11/2017  . Gout 02/27/2013  . Essential hypertension, benign 02/27/2013  . Type II or unspecified type diabetes mellitus without mention of complication, uncontrolled 02/27/2013   Past Medical History:  Diagnosis Date  . Arthritis   . Diabetes mellitus without complication ([place])   . Heart murmur   . Hypertension    Past Surgical History:  Procedure Laterality Date  . CATARACT EXTRACTION    . FRACTURE  SURGERY     Lt ankle  . GAS/FLUID EXCHANGE Left 08/12/2017   Procedure: C3F8 GAS/FLUID EXCHANGE;  Surgeon: Bernarda Caffey, MD;  Location: [place];  Service: Ophthalmology;  Laterality: Left;  . HERNIA REPAIR    . IRIDOTOMY / IRIDECTOMY Bilateral    stents  . VASECTOMY    . VITRECTOMY 25 GAUGE WITH SCLERAL BUCKLE Left 08/12/2017   Procedure: VITRECTOMY 25 GAUGE WITH SCLERAL BUCKLE WITH ENDOLASER;  Surgeon: Bernarda Caffey, MD;  Location: [place];  Service: Ophthalmology;  Laterality: Left;   No Known Allergies Prior to Admission medications   Medication Sig Start Date End Date Taking? Authorizing Provider  ACCU-CHEK SOFTCLIX LANCETS lancets  Test blood sugar once daily. Dx E11.9 08/26/15   Jeffery Agreste, MD  amLODipine (NORVASC) 5 MG tablet TAKE 1 TABLET DAILY 06/22/17   Jeffery Agreste, MD  aspirin EC 81 MG tablet Take 1 tablet (81 mg total) by mouth daily. 02/27/13   Shawnee Knapp, MD  atorvastatin (LIPITOR) 10 MG tablet Take 1 tablet (10 mg total) by mouth daily. 06/22/17   Jeffery Agreste, MD  bacitracin-polymyxin b (POLYSPORIN) ophthalmic ointment Place into the left eye 4 (four) times daily. Place a 1/2 inch ribbon of ointment into the lower eyelid. 09/07/17   Bernarda Caffey, MD  blood glucose meter kit and supplies TrueMetrix, TruTrack, Accu-Chek Aviva Expert, Accu-Chek Aviva Plus, Accu-Chek Guide, Accu-Chek Nano, or Accu-Chek Smartview.   Use once per day. 07/31/17   Jeffery Agreste, MD  Blood Glucose Monitoring Suppl (BLOOD GLUCOSE MONITOR KIT) KIT Use to test blood sugar twice daily. 07/17/13   Jeffery Agreste, MD  brimonidine (ALPHAGAN) 0.2 % ophthalmic solution Place 1 drop into the left eye 2 (two) times daily. 10/22/17 10/22/18  Bernarda Caffey, MD  colchicine (COLCRYS) 0.6 MG tablet Take 2 tabs po at symptom onset. Repeat 1 tab po 1 hour later. 06/22/17   Jeffery Agreste, MD  dorzolamide-timolol (COSOPT) 22.3-6.8 MG/ML ophthalmic solution Place 1 drop into the left eye 2 (two) times daily. 10/22/17 10/22/18  Bernarda Caffey, MD  glucose blood (ONE TOUCH ULTRA TEST) test strip 1 each by Other route daily. Use as instructed 06/22/17   Jeffery Agreste, MD  HYDROcodone-acetaminophen (NORCO/VICODIN) 5-325 MG tablet Take 1 tablet by mouth every 4 (four) hours as needed for moderate pain. 08/12/17   Bernarda Caffey, MD  indomethacin (INDOCIN) 50 MG capsule TAKE 1 CAPSULE THREE TIMES A DAY AS NEEDED FOR MODERATE PAIN 06/22/17   Jeffery Agreste, MD  Lancets [facility] ULTRASOFT) lancets Use as instructed 06/05/13   Jeffery Agreste, MD  latanoprost (XALATAN) 0.005 % ophthalmic solution Place 1 drop into both eyes every evening.  07/23/17    [provider]  losartan (COZAAR) 100 MG tablet Take 1 tablet (100 mg total) by mouth daily. 06/22/17   Jeffery Agreste, MD  metFORMIN (GLUCOPHAGE) 500 MG tablet Take 1 tablet (500 mg total) by mouth daily with breakfast. 06/22/17   Jeffery Agreste, MD  Omega-3 Fatty Acids (FISH OIL) 1000 MG CAPS Take by mouth daily.     [provider]  prednisoLONE acetate (PRED FORTE) 1 % ophthalmic suspension Place 1 drop into the left eye 4 (four) times daily. 10/22/17   Bernarda Caffey, MD   Social History   Socioeconomic History  . Marital status: Married    Spouse name: Not on file  . Number of children: Not on file  . Years of education: Not on  file  . Highest education level: Not on file  Occupational History  . Occupation: Educational psychologist  Social Needs  . Financial resource strain: Not on file  . Food insecurity:    Worry: Not on file    Inability: Not on file  . Transportation needs:    Medical: Not on file    Non-medical: Not on file  Tobacco Use  . Smoking status: Former Research scientist (life sciences)  . Smokeless tobacco: Never Used  Substance and Sexual Activity  . Alcohol use: No    Alcohol/week: 0.0 standard drinks  . Drug use: No  . Sexual activity: Never  Lifestyle  . Physical activity:    Days per week: Not on file    Minutes per session: Not on file  . Stress: Not on file  Relationships  . Social connections:    Talks on phone: Not on file    Gets together: Not on file    Attends religious service: Not on file    Active member of club or organization: Not on file    Attends meetings of clubs or organizations: Not on file    Relationship status: Not on file  . Intimate partner violence:    Fear of current or ex partner: Not on file    Emotionally abused: Not on file    Physically abused: Not on file    Forced sexual activity: Not on file  Other Topics Concern  . Not on file  Social History Narrative   Married   Counsellor at [employer]  Constitutional: Negative for fatigue, fever and unexpected weight change.  HENT: Positive for congestion and rhinorrhea.   Eyes: Negative for visual disturbance.  Respiratory: Negative for cough and chest tightness. Shortness of breath: resolved.   Cardiovascular: Negative for palpitations and leg swelling. Chest pain: resolved.  Gastrointestinal: Negative for abdominal pain and blood in stool.  Musculoskeletal: Negative for arthralgias and myalgias.  Neurological: Negative for dizziness, light-headedness and headaches.      Objective:   Physical Exam  Constitutional: He is oriented to person, place, and time. He appears well-developed and well-nourished.  HENT:  Head: Normocephalic and atraumatic.  Eyes: Pupils are equal, round, and reactive to light. EOM are normal.  Neck: No JVD present. Carotid bruit is not present.  Cardiovascular: Normal rate and regular rhythm.  Murmur heard.  Systolic (3-4) murmur is present with a grade of 3/6. Pulmonary/Chest: Effort normal and breath sounds normal. He has no rales.  Musculoskeletal: He exhibits no edema.  Neurological: He is alert and oriented to person, place, and time.  Skin: Skin is warm and dry.  Psychiatric: He has a normal mood and affect.  Vitals reviewed.  Vitals:   12/23/17 0830 12/23/17 0835  BP: (!) 174/73 (!) 146/78  Pulse: 63   Temp: 98 F (36.7 C)   TempSrc: Oral   SpO2: 98%   Weight: 261 lb 9.6 oz (118.7 kg)   Height: '5\' 11"'$  (1.803 m)    EKG reading done by Jeffery Agreste, MD: sinus bradycardia rate 58. No apparent acute ST/T wave changes.     Assessment & Plan:   SCHNEUR CROWSON is a 72 y.o. male Shortness of breath - Plan: Ambulatory referral to Cardiology, EKG 12-Lead, DG Chest 2 View, CANCELED: DG Chest 2 View Heart murmur - Plan: Ambulatory referral to Cardiology, EKG 12-LeadFamily history of heart disease - Plan: Ambulatory referral to Cardiology, EKG 12-Lead Chest discomfort -  Plan: Ambulatory  referral to Cardiology, EKG 12-Lead, DG Chest 2 View, CANCELED: DG Chest 2 View  -History of heart murmur with previous plan for echocardiogram.  Does report some dyspnea on exertion, no acute findings on EKG and reassuring exam at present, and improved symptoms.  Chest x-ray without concerning findings.  -Refer to cardiology, ER precautions if worsening symptoms or chest pain.  Acute upper respiratory infection  -Symptomatic care discussed.  RTC precautions.  Avoid decongestants with history of hypertension  Essential hypertension, benign - Plan: Comprehensive metabolic panel, amLODipine (NORVASC) 5 MG tablet, losartan (COZAAR) 100 MG tablet  -.  Borderline control, but recent cold medication may have increased readings.  continue same dose of medications for now.  Hyperlipidemia, unspecified hyperlipidemia type - Plan: Lipid panel, Comprehensive metabolic panel, atorvastatin (LIPITOR) 10 MG tablet  - Stable, tolerating current regimen. Medications refilled. Labs pending as above.   Controlled type 2 diabetes mellitus without complication, without long-term current use of insulin ([place]) - Plan: Hemoglobin A1c, metFORMIN (GLUCOPHAGE) 500 MG tablet  - Stable, tolerating current regimen. Medications refilled. Labs pending as above.   Gout, unspecified cause, unspecified chronicity, unspecified site - Plan: Uric Acid  -Overall stable, check uric acid.  No change in regimen at this time.  Meds ordered this encounter  Medications  . amLODipine (NORVASC) 5 MG tablet    Sig: TAKE 1 TABLET DAILY    Dispense:  90 tablet    Refill:  2  . atorvastatin (LIPITOR) 10 MG tablet    Sig: Take 1 tablet (10 mg total) by mouth daily.    Dispense:  90 tablet    Refill:  2  . losartan (COZAAR) 100 MG tablet    Sig: Take 1 tablet (100 mg total) by mouth daily.    Dispense:  90 tablet    Refill:  2  . metFORMIN (GLUCOPHAGE) 500 MG tablet    Sig: Take 1 tablet (500 mg total) by mouth daily with breakfast.     Dispense:  90 tablet    Refill:  2   Patient Instructions    Current congestion is likely due to an upper respiratory infection or cold virus.  As it is improving, okay to continue symptomatic care with Mucinex, saline nasal spray, drink plenty of fluids.  Try to avoid decongestants as those can increase blood pressure.  If any worsening symptoms or fever please return for other testing.   No change in diabetes, cholesterol, gout medication at this time.  I will refer you to cardiology to evaluate the heart murmur further along with the prior shortness of breath and chest discomfort although that may have been due to heartburn.  If you have any return of chest discomfort or chest pain, be seen immediately through the emergency room.  Also if you have any worsening shortness of breath, be seen right away.  Please proceed to [place] [facility]. today for your chest x-ray.  I will let you know if there are any concerning findings on that study.  Return to the clinic or go to the nearest emergency room if any of your symptoms worsen or new symptoms occur.   If you have lab work done today you will be contacted with your lab results within the next 2 weeks.  If you have not heard from [place] then please contact us. The fastest way to get your results is to register for My Chart.   IF you received an x-ray today,  you will receive an invoice from [facility]. Please contact [facility]iology at [phone] with questions or concerns regarding your invoice.   IF you received labwork today, you will receive an invoice from [facility]. Please contact [facility] at [phone] with questions or concerns regarding your invoice.   Our billing staff will not be able to assist you with questions regarding bills from these companies.  You will be contacted with the lab results as soon as they are available. The fastest way to get your results is to activate your My Chart  account. Instructions are located on the last page of this paperwork. If you have not heard from [place] regarding the results in 2 weeks, please contact this office.       I personally performed the services described in this documentation, which was scribed in my presence. The recorded information has been reviewed and considered for accuracy and completeness, addended by me as needed, and agree with information above.  Signed,   Merri Ray, MD Primary Care at [place].  12/23/17 10:02 PM

## 2017-12-23 NOTE — Patient Instructions (Addendum)
  Current congestion is likely due to an upper respiratory infection or cold virus.  As it is improving, okay to continue symptomatic care with Mucinex, saline nasal spray, drink plenty of fluids.  Try to avoid decongestants as those can increase blood pressure.  If any worsening symptoms or fever please return for other testing.   No change in diabetes, cholesterol, gout medication at this time.  I will refer you to cardiology to evaluate the heart murmur further along with the prior shortness of breath and chest discomfort although that may have been due to heartburn.  If you have any return of chest discomfort or chest pain, be seen immediately through the emergency room.  Also if you have any worsening shortness of breath, be seen right away.  Please proceed to [place] [facility]. today for your chest x-ray.  I will let you know if there are any concerning findings on that study.  Return to the clinic or go to the nearest emergency room if any of your symptoms worsen or new symptoms occur.   If you have lab work done today you will be contacted with your lab results within the next 2 weeks.  If you have not heard from [place] then please contact us. The fastest way to get your results is to register for My Chart.   IF you received an x-ray today, you will receive an invoice from [facility]. Please contact [facility]iology at [phone] with questions or concerns regarding your invoice.   IF you received labwork today, you will receive an invoice from [place]. Please contact [facility] at [phone] with questions or concerns regarding your invoice.   Our billing staff will not be able to assist you with questions regarding bills from these companies.  You will be contacted with the lab results as soon as they are available. The fastest way to get your results is to activate your My Chart account. Instructions are located on the last page of this paperwork.  If you have not heard from [place] regarding the results in 2 weeks, please contact this office.

## 2017-12-24 ENCOUNTER — Ambulatory Visit (INDEPENDENT_AMBULATORY_CARE_PROVIDER_SITE_OTHER): Payer: Medicare HMO | Admitting: Ophthalmology

## 2017-12-24 ENCOUNTER — Encounter (INDEPENDENT_AMBULATORY_CARE_PROVIDER_SITE_OTHER): Payer: Self-pay | Admitting: Ophthalmology

## 2017-12-24 DIAGNOSIS — Z961 Presence of intraocular lens: Secondary | ICD-10-CM | POA: Diagnosis not present

## 2017-12-24 DIAGNOSIS — H3322 Serous retinal detachment, left eye: Secondary | ICD-10-CM

## 2017-12-24 DIAGNOSIS — H401131 Primary open-angle glaucoma, bilateral, mild stage: Secondary | ICD-10-CM

## 2017-12-24 DIAGNOSIS — H3581 Retinal edema: Secondary | ICD-10-CM

## 2017-12-24 LAB — URIC ACID: Uric Acid: 5.5 mg/dL (ref 3.7–8.6)

## 2017-12-24 MED ORDER — DORZOLAMIDE HCL-TIMOLOL MAL 2-0.5 % OP SOLN: 1.0000 [drp] | Freq: Two times a day (BID) | OPHTHALMIC | 5 refills | Status: DC

## 2017-12-26 ENCOUNTER — Encounter (INDEPENDENT_AMBULATORY_CARE_PROVIDER_SITE_OTHER): Payer: Self-pay | Admitting: Ophthalmology

## 2018-01-05 ENCOUNTER — Encounter: Payer: Self-pay | Admitting: Radiology

## 2018-01-26 DIAGNOSIS — H401131 Primary open-angle glaucoma, bilateral, mild stage: Secondary | ICD-10-CM | POA: Diagnosis not present

## 2018-03-09 ENCOUNTER — Other Ambulatory Visit: Payer: Self-pay | Admitting: Family Medicine

## 2018-03-09 DIAGNOSIS — E119 Type 2 diabetes mellitus without complications: Secondary | ICD-10-CM

## 2018-03-11 NOTE — Telephone Encounter (Signed)
Requested Prescriptions  Pending Prescriptions Disp Refills  . [place] [Pharmacy Med Name: TRUEPLUS LANCETS 33G   ] 100 each 0    Sig: TEST ONE TIME DAILY     Endocrinology: Diabetes - Testing Supplies Passed - 03/09/2018  8:53 PM      Passed - Valid encounter within last 12 months    Recent Outpatient Visits          2 months ago Shortness of breath   [facility] [place], Ranell Patrick, MD   8 months ago Controlled type 2 diabetes mellitus without complication, without long-term current use of insulin [facility])   [facility], Ranell Patrick, MD   1 year ago Type 2 diabetes mellitus without complication, without long-term current use of insulin [facility])   [facility], Ranell Patrick, MD   1 year ago Medicare annual wellness visit, subsequent   [facility], Ranell Patrick, MD   2 years ago Type 2 diabetes mellitus without complication, without long-term current use of insulin [facility])   [facility], Ranell Patrick, MD      Future Appointments            In 3 months Carlota Raspberry Ranell Patrick, MD [facility] at [place], [place] BLOOD GLUCOSE TEST test strip [Pharmacy Med Name: TRUE METRIX SELF MONITORING BLOOD GLUCOSE STRIPS   Strip]  0    Sig: TEST ONE TIME DAILY     Endocrinology: Diabetes - Testing Supplies Passed - 03/09/2018  8:53 PM      Passed - Valid encounter within last 12 months    Recent Outpatient Visits          2 months ago Shortness of breath   [facility], Ranell Patrick, MD   8 months ago Controlled type 2 diabetes mellitus without complication, without long-term current use of insulin [facility])   [facility], Ranell Patrick, MD   1 year ago Type 2 diabetes mellitus without complication, without long-term current use of insulin [facility])   [facility], Ranell Patrick, MD   1 year ago Medicare annual wellness visit, subsequent   [facility], Ranell Patrick, MD   2 years ago Type 2 diabetes mellitus without complication, without long-term current use of insulin [facility])   [facility], Ranell Patrick, MD      Future Appointments            In 3 months Carlota Raspberry Ranell Patrick, MD Primary [facility] at [place], [facility]         . metFORMIN (GLUCOPHAGE) 500 MG tablet [Pharmacy Med Name: METFORMIN HYDROCHLORIDE 500 MG Tablet] 90 tablet 0    Sig: TAKE 1 TABLET ONE TIME DAILY WITH BREAKFAST     Endocrinology:  Diabetes - Biguanides Passed - 03/09/2018  8:53 PM      Passed - Cr in normal range and within 360 days    Creat  Date Value Ref Range Status  11/25/2015 0.93 0.70 - 1.25 mg/dL Final    Comment:      For patients > or = 71 years of age: The upper reference limit for Creatinine is approximately 13% higher for people identified as African-American.      Creatinine, Ser  Date Value Ref Range Status  12/23/2017 0.84 0.76 - 1.27 mg/dL Final  Passed - HBA1C is between 0 and 7.9 and within 180 days    Hgb A1c MFr Bld  Date Value Ref Range Status  12/23/2017 6.5 (H) 4.8 - 5.6 % Final    Comment:             Prediabetes: 5.7 - 6.4          Diabetes: >6.4          Glycemic control for adults with diabetes: <7.0          Passed - eGFR in normal range and within 360 days    GFR, Est African American  Date Value Ref Range Status  11/25/2015 >89 >=60 mL/min Final   GFR calc Af Amer  Date Value Ref Range Status  12/23/2017 102 >59 mL/min/1.73 Final   GFR, Est Non African American  Date Value Ref Range Status  11/25/2015 83 >=60 mL/min Final   GFR calc non Af Amer  Date Value Ref Range Status  12/23/2017 88 >59 mL/min/1.73 Final         Passed - Valid encounter within last 6 months    Recent Outpatient Visits          2 months ago Shortness of breath   [facility], Ranell Patrick, MD   8 months ago Controlled type 2 diabetes mellitus without complication, without long-term current use of  insulin [facility])   [facility], Ranell Patrick, MD   1 year ago Type 2 diabetes mellitus without complication, without long-term current use of insulin [facility])   [facility], Ranell Patrick, MD   1 year ago Medicare annual wellness visit, subsequent   [facility], Ranell Patrick, MD   2 years ago Type 2 diabetes mellitus without complication, without long-term current use of insulin [facility])   [facility], Ranell Patrick, MD      Future Appointments            In 3 months Carlota Raspberry Ranell Patrick, MD Primary Care at [place], [facility]

## 2018-03-11 NOTE — Telephone Encounter (Signed)
Requested medication (s) are due for refill today: ?  Requested medication (s) are on the active medication list: no  Last refill:  08/04/17  Future visit scheduled: no  Notes to clinic:  Requested supplies do not match medication list    Requested Prescriptions  Pending Prescriptions Disp Norco [Pharmacy Med Name: TRUEPLUS LANCETS 33G   ] 100 each 0    Sig: TEST ONE TIME DAILY     Endocrinology: Diabetes - Testing Supplies Passed - 03/09/2018  8:53 PM      Passed - Valid encounter within last 12 months    Recent Outpatient Visits          2 months ago Shortness of breath   [facility], Ranell Patrick, MD   8 months ago Controlled type 2 diabetes mellitus without complication, without long-term current use of insulin [facility])   [facility], Ranell Patrick, MD   1 year ago Type 2 diabetes mellitus without complication, without long-term current use of insulin [facility])   [facility], Ranell Patrick, MD   1 year ago Medicare annual wellness visit, subsequent   [facility], Ranell Patrick, MD   2 years ago Type 2 diabetes mellitus without complication, without long-term current use of insulin [facility])   [facility], Ranell Patrick, MD      Future Appointments            In 3 months Carlota Raspberry Ranell Patrick, MD [facility] [place], [place] TEST test strip [Pharmacy Med Name: TRUE METRIX SELF MONITORING BLOOD GLUCOSE STRIPS   Strip]  0    Sig: TEST ONE TIME DAILY     Endocrinology: Diabetes - Testing Supplies Passed - 03/09/2018  8:53 PM      Passed - Valid encounter within last 12 months    Recent Outpatient Visits          2 months ago Shortness of breath   [facility], Ranell Patrick, MD   8 months ago Controlled type 2 diabetes mellitus without complication, without long-term current use of insulin [facility])   [facility], Ranell Patrick, MD   1 year ago Type 2 diabetes mellitus without complication, without long-term current use of insulin [facility])   [facility], Ranell Patrick, MD   1 year ago Medicare annual wellness visit, subsequent   [facility], Ranell Patrick, MD   2 years ago Type 2 diabetes mellitus without complication, without long-term current use of insulin [facility])   [facility], Ranell Patrick, MD      Future Appointments            In 3 months Carlota Raspberry Ranell Patrick, MD [facility] at [place], [facility]         Signed Prescriptions Disp Refills   metFORMIN (GLUCOPHAGE) 500 MG tablet 90 tablet 0    Sig: TAKE 1 TABLET ONE TIME DAILY WITH BREAKFAST     Endocrinology:  Diabetes - Biguanides Passed - 03/09/2018  8:53 PM      Passed - Cr in normal range and within 360 days    Creat  Date Value Ref Range Status  11/25/2015 0.93 0.70 - 1.25 mg/dL Final    Comment:      For patients >  or = 71 years of age: The upper reference limit for Creatinine is approximately 13% higher for people identified as African-American.      Creatinine, Ser  Date Value Ref Range Status  12/23/2017 0.84 0.76 - 1.27 mg/dL Final         Passed - HBA1C is between 0 and 7.9 and within 180 days    Hgb A1c MFr Bld  Date Value Ref Range Status  12/23/2017 6.5 (H) 4.8 - 5.6 % Final    Comment:             Prediabetes: 5.7 - 6.4          Diabetes: >6.4          Glycemic control for adults with diabetes: <7.0          Passed - eGFR in normal range and within 360 days    GFR, Est African American  Date Value Ref Range Status  11/25/2015 >89 >=60 mL/min Final   GFR calc Af Amer  Date Value Ref Range Status  12/23/2017 102 >59 mL/min/1.73 Final   GFR, Est Non African American  Date Value Ref Range Status  11/25/2015 83 >=60 mL/min Final   GFR calc non Af Amer  Date Value Ref Range Status  12/23/2017 88 >59 mL/min/1.73 Final         Passed - Valid encounter within last 6 months     Recent Outpatient Visits          2 months ago Shortness of breath   [facility], Ranell Patrick, MD   8 months ago Controlled type 2 diabetes mellitus without complication, without long-term current use of insulin [facility])   [facility], Ranell Patrick, MD   1 year ago Type 2 diabetes mellitus without complication, without long-term current use of insulin [facility])   [facility], Ranell Patrick, MD   1 year ago Medicare annual wellness visit, subsequent   [facility], Ranell Patrick, MD   2 years ago Type 2 diabetes mellitus without complication, without long-term current use of insulin [facility])   [facility], Ranell Patrick, MD      Future Appointments            In 3 months Carlota Raspberry Ranell Patrick, MD [facility] at [place], [facility]

## 2018-03-29 ENCOUNTER — Telehealth: Payer: Self-pay | Admitting: Cardiology

## 2018-03-29 NOTE — Telephone Encounter (Signed)
Called patient and LVM to call back to schedule appt.

## 2018-04-27 DIAGNOSIS — H401131 Primary open-angle glaucoma, bilateral, mild stage: Secondary | ICD-10-CM | POA: Diagnosis not present

## 2018-04-27 DIAGNOSIS — E119 Type 2 diabetes mellitus without complications: Secondary | ICD-10-CM | POA: Diagnosis not present

## 2018-05-19 DIAGNOSIS — H1089 Other conjunctivitis: Secondary | ICD-10-CM | POA: Diagnosis not present

## 2018-05-31 NOTE — Progress Notes (Signed)
**Jeffery Jeffery** Jeffery Jeffery  06/01/2018     CHIEF COMPLAINT Patient presents for Retina Follow Up   HISTORY OF PRESENT ILLNESS: Jeffery Jeffery is a 73 y.o. male who presents to the clinic today for:   HPI    Retina Follow Up    Patient presents with  Retinal Break/Detachment.  In left eye.  This started 5 months ago.  Severity is moderate.  Duration of 5 months.  Since onset it is gradually improving.  I, the attending physician,  performed the HPI with the patient and updated documentation appropriately.          Comments    Patient here for 5 months RD OS ( s/p SBP 05.02). Patient states vision doing pretty good. No eye pain. Had an eye infection OS about 2 - 3 weeks ago. Was given eye drop Tobramycin, still using 5 times a day OS.        Last edited by Bernarda Caffey, MD on 06/01/2018 12:25 PM. (History)    pt states he has been doing well, he states he had an eye infection a few weeks ago and saw Dr. Idolina George and got some drops to clear it up, he states Dr. Idolina George was impressed with how well his vision is doing  Referring physician: [name], MD [place], [place] [id number]  HISTORICAL INFORMATION:   Selected notes from the [facility] Referred by Dr. Parke Simmers for possible RD OS LEE- 05.01.19  Ocular Hx- pseudophakia OU with dual I-stent (OD 02.11.19 OS 02.25.19 with Dr. Cher Nakai),  PMH- DM, heart murmur    CURRENT MEDICATIONS: Current Outpatient Medications (Ophthalmic Drugs)  Medication Sig  . brimonidine (ALPHAGAN) 0.2 % ophthalmic solution Place 1 drop into the left eye 2 (two) times daily.  . dorzolamide-timolol (COSOPT) 22.3-6.8 MG/ML ophthalmic solution Place 1 drop into the left eye 2 (two) times daily.  Marland Kitchen latanoprost (XALATAN) 0.005 % ophthalmic solution Place 1 drop into both eyes every evening.    No current facility-administered medications for this visit.  (Ophthalmic Drugs)   Current Outpatient Medications (Other)   Medication Sig  . ACCU-CHEK SOFTCLIX LANCETS lancets Test blood sugar once daily. Dx E11.9  . amLODipine (NORVASC) 5 MG tablet TAKE 1 TABLET DAILY  . aspirin EC 81 MG tablet Take 1 tablet (81 mg total) by mouth daily.  Marland Kitchen atorvastatin (LIPITOR) 10 MG tablet Take 1 tablet (10 mg total) by mouth daily.  . blood glucose meter kit and supplies TrueMetrix, TruTrack, Accu-Chek Aviva Expert, Accu-Chek Aviva Plus, Accu-Chek Guide, Accu-Chek Nano, or Accu-Chek Smartview.   Use once per day.  . Blood Glucose Monitoring Suppl (BLOOD GLUCOSE MONITOR KIT) KIT Use to test blood sugar twice daily.  . colchicine (COLCRYS) 0.6 MG tablet Take 2 tabs po at symptom onset. Repeat 1 tab po 1 hour later.  . indomethacin (INDOCIN) 50 MG capsule TAKE 1 CAPSULE THREE TIMES A DAY AS NEEDED FOR MODERATE PAIN  . Lancets (ONETOUCH ULTRASOFT) lancets Use as instructed  . losartan (COZAAR) 100 MG tablet Take 1 tablet (100 mg total) by mouth daily.  . metFORMIN (GLUCOPHAGE) 500 MG tablet TAKE 1 TABLET ONE TIME DAILY WITH BREAKFAST  . Omega-3 Fatty Acids (FISH OIL) 1000 MG CAPS Take by mouth daily.   . TRUE METRIX BLOOD GLUCOSE TEST test strip TEST ONE TIME DAILY  . TRUEPLUS LANCETS 33G MISC TEST ONE TIME DAILY   No current facility-administered medications for this visit.  (  Other)      REVIEW OF SYSTEMS: ROS    Positive for: Endocrine, Cardiovascular, Eyes   Negative for: Constitutional, Gastrointestinal, Neurological, Skin, Genitourinary, Musculoskeletal, HENT, Respiratory, Psychiatric, Allergic/Imm, Heme/Lymph   Last edited by Theodore Demark on 06/01/2018 10:02 AM. (History)       ALLERGIES No Known Allergies  PAST MEDICAL HISTORY Past Medical History:  Diagnosis Date  . Arthritis   . Diabetes mellitus without complication ([place])   . Heart murmur   . Hypertension    Past Surgical History:  Procedure Laterality Date  . CATARACT EXTRACTION    . FRACTURE SURGERY     Lt ankle  . GAS/FLUID EXCHANGE  Left 08/12/2017   Procedure: C3F8 GAS/FLUID EXCHANGE;  Surgeon: Bernarda Caffey, MD;  Location: [place];  Service: Ophthalmology;  Laterality: Left;  . HERNIA REPAIR    . IRIDOTOMY / IRIDECTOMY Bilateral    stents  . VASECTOMY    . VITRECTOMY 25 GAUGE WITH SCLERAL BUCKLE Left 08/12/2017   Procedure: VITRECTOMY 25 GAUGE WITH SCLERAL BUCKLE WITH ENDOLASER;  Surgeon: Bernarda Caffey, MD;  Location: [place];  Service: Ophthalmology;  Laterality: Left;    FAMILY HISTORY Family History  Problem Relation Age of Onset  . Cancer Mother   . Diabetes Mother   . Stroke Mother   . Heart disease Father   . Stroke Father   . Diabetes Brother   . Heart disease Brother   . Diabetes Brother   . Colon cancer Neg Hx     SOCIAL HISTORY Social History   Tobacco Use  . Smoking status: Former Research scientist (life sciences)  . Smokeless tobacco: Never Used  Substance Use Topics  . Alcohol use: No    Alcohol/week: 0.0 standard drinks  . Drug use: No         OPHTHALMIC EXAM:  Base Eye Exam    Visual Acuity (Snellen - Linear)      Right Left   Dist cc 20/25 -2 20/80 -1   Dist ph cc 20/20 -2 20/30 -2   Correction:  Glasses       Tonometry (Tonopen, 9:58 AM)      Right Left   Pressure 20 19       Pupils      Dark Light Shape React APD   Right 4 3 Round Brisk None   Left 4 3 Round Brisk None       Visual Fields (Counting fingers)      Left Right     Full   Restrictions Partial outer inferior temporal, inferior nasal deficiencies        Extraocular Movement      Right Left    Full, Ortho Full, Ortho       Neuro/Psych    Oriented x3:  Yes   Mood/Affect:  Normal       Dilation    Both eyes:  1.0% Mydriacyl, 2.5% Phenylephrine @ 9:58 AM        Slit Lamp and Fundus Exam    Slit Lamp Exam      Right Left   Lids/Lashes Dermatochalasis - upper lid Dermatochalasis - upper lid, peri orbital edema, Ecchymosis   Conjunctiva/Sclera White and quiet White and quiet   Cornea Arcus, Well healed cataract wounds, 1+  Punctate epithelial erosions Arcus, Well healed cataract wounds, 1+ Punctate epithelial erosions   Anterior Chamber Deep and quiet, stent at 0230 and 0300 angle  Deep and quiet   Iris Round and dilated to 7.58m Round  and dilated to 88m, Posterior synechiae at 0600 -- now broken   Lens PC IOL in good position, trace Posterior capsular opacification PC IOL in good position   Vitreous Vitreous syneresis Post vitrectomy       Fundus Exam      Right Left   Disc 360 Peripapillary atrophy, Pink and Sharp 360 Peripapillary atrophy, Pink and Sharp   C/D Ratio 0.6 0.6   Macula Blunted foveal reflex, Flat, Retinal pigment epithelial mottling, Drusen, Epiretinal membrane, No heme or edema Flat, mild Retinal pigment epithelial mottling, trace Epiretinal membrane   Vessels Mild Vascular attenuation Mild Vascular attenuation   Periphery Attached, scattered peripheral drusen / RPE changes, No RT/RD 360, reticular degeneration Retina attached; good buckle height, great laser in place 360 on buckle and just posterior to buckle        Refraction    Wearing Rx      Sphere Cylinder Axis Add   Right -1.25 +2.50 012 +2.75   Left -1.50 +2.25 003 +2.75   Type:  PAL          IMAGING AND PROCEDURES  Imaging and Procedures for 08/13/17  OCT, Retina - OU - Both Eyes       Right Eye Quality was good. Central Foveal Thickness: 261. Progression has been stable. Findings include normal foveal contour, no IRF, no SRF, retinal drusen  (Trace ERM).   Left Eye Quality was good. Central Foveal Thickness: 275. Progression has improved. Findings include normal foveal contour, no IRF, no SRF, epiretinal membrane, retinal drusen  (interval improvement in ellipsoid hyperreflectivity).   Notes *Images captured and stored on drive  Diagnosis / Impression:  OD: NFP, No IRF/SRF; scattered drusen OS: retina / fovea reattached, interval improvement in ellipsoid hyperreflectivity  Clinical management:  See  below  Abbreviations: NFP - Normal foveal profile. CME - cystoid macular edema. PED - pigment epithelial detachment. IRF - intraretinal fluid. SRF - subretinal fluid. EZ - ellipsoid zone. ERM - epiretinal membrane. ORA - outer retinal atrophy. ORT - outer retinal tubulation. SRHM - subretinal hyper-reflective material                  ASSESSMENT/PLAN:    ICD-10-CM   1. Left retinal detachment H33.22   2. Retinal edema H35.81 OCT, Retina - OU - Both Eyes  3. Pseudophakia of both eyes Z96.1   4. Primary open angle glaucoma (POAG) of both eyes, mild stage H40.1131     1. Retinal detachment, OS-  - bullous superior detachment from 1030 to 0230, fovea splitting - small breaks ST quad within patch of thin retina - s/p SBP + PPV/PFC/EL/FAX/14% C3F8 OS, 05.02.19             - doing great -- BCVA 20/30 today!!             - retina reattached and in good position -- good buckle height and laser around breaks  - OCT shows reconstitution of ellipsoid centrally             - IOP 19 today  - cont Cosopt OS BID  - F/U 6-9 months  2. No retinal edema on exam or OCT  3. Pseudophakia OU  - s/p CE/IOL w/ iStent OU by Dr. BCher Nakai - beautiful surgeries, doing well  - monitor  4. POAG OU - s/p iStents OU as above - IOP stable -- drops as above - under the expert management of Dr. BCher Nakai Ophthalmic Meds Ordered this  visit:  No orders of the defined types were placed in this encounter.      Return in about 9 months (around 03/02/2019) for Dilated Exam, OCT.  There are no Patient Instructions on file for this visit.   Explained the diagnoses, plan, and follow up with the patient and they expressed understanding.  Patient expressed understanding of the importance of proper follow up care.   This document serves as a record of services personally performed by Gardiner Sleeper, MD, PhD. It was created on their behalf by Ernest Mallick, OA, an ophthalmic assistant. The creation of  this record is the provider's dictation and/or activities during the visit.    Electronically signed by: Ernest Mallick, OA  02.18.2020 12:26 PM     Gardiner Sleeper, M.D., Ph.D. Diseases & Surgery of the Retina and Vitreous [facility]  I have reviewed the above documentation for accuracy and completeness, and I agree with the above. Gardiner Sleeper, M.D., Ph.D. 06/01/18 12:27 PM     Abbreviations: M myopia (nearsighted); A astigmatism; H hyperopia (farsighted); P presbyopia; Mrx spectacle prescription;  CTL contact lenses; OD right eye; OS left eye; OU both eyes  XT exotropia; ET esotropia; PEK punctate epithelial keratitis; PEE punctate epithelial erosions; DES dry eye syndrome; MGD meibomian gland dysfunction; ATs artificial tears; PFAT's preservative free artificial tears; [place] nuclear sclerotic cataract; PSC posterior subcapsular cataract; ERM epi-retinal membrane; PVD posterior vitreous detachment; RD retinal detachment; DM diabetes mellitus; DR diabetic retinopathy; NPDR non-proliferative diabetic retinopathy; PDR proliferative diabetic retinopathy; CSME clinically significant macular edema; DME diabetic macular edema; dbh dot blot hemorrhages; CWS cotton wool spot; POAG primary open angle glaucoma; C/D cup-to-disc ratio; HVF humphrey visual field; GVF goldmann visual field; OCT optical coherence tomography; IOP intraocular pressure; BRVO Branch retinal vein occlusion; CRVO central retinal vein occlusion; CRAO central retinal artery occlusion; BRAO branch retinal artery occlusion; RT retinal tear; SB scleral buckle; PPV pars plana vitrectomy; VH Vitreous hemorrhage; PRP panretinal laser photocoagulation; IVK intravitreal kenalog; VMT vitreomacular traction; MH Macular hole;  NVD neovascularization of the disc; NVE neovascularization elsewhere; AREDS age related eye disease study; ARMD age related macular degeneration; POAG primary open angle glaucoma; EBMD  epithelial/anterior basement membrane dystrophy; ACIOL anterior chamber intraocular lens; IOL intraocular lens; PCIOL posterior chamber intraocular lens; Phaco/IOL phacoemulsification with intraocular lens placement; [place] photorefractive keratectomy; LASIK laser assisted in situ keratomileusis; HTN hypertension; DM diabetes mellitus; COPD chronic obstructive pulmonary disease

## 2018-06-01 ENCOUNTER — Ambulatory Visit (INDEPENDENT_AMBULATORY_CARE_PROVIDER_SITE_OTHER): Payer: Medicare HMO | Admitting: Ophthalmology

## 2018-06-01 ENCOUNTER — Encounter (INDEPENDENT_AMBULATORY_CARE_PROVIDER_SITE_OTHER): Payer: Self-pay | Admitting: Ophthalmology

## 2018-06-01 DIAGNOSIS — H401131 Primary open-angle glaucoma, bilateral, mild stage: Secondary | ICD-10-CM

## 2018-06-01 DIAGNOSIS — H3322 Serous retinal detachment, left eye: Secondary | ICD-10-CM | POA: Diagnosis not present

## 2018-06-01 DIAGNOSIS — Z961 Presence of intraocular lens: Secondary | ICD-10-CM

## 2018-06-01 DIAGNOSIS — H3581 Retinal edema: Secondary | ICD-10-CM

## 2018-06-27 ENCOUNTER — Ambulatory Visit (INDEPENDENT_AMBULATORY_CARE_PROVIDER_SITE_OTHER): Payer: Medicare HMO | Admitting: Family Medicine

## 2018-06-27 ENCOUNTER — Other Ambulatory Visit: Payer: Self-pay

## 2018-06-27 ENCOUNTER — Encounter: Payer: Self-pay | Admitting: Family Medicine

## 2018-06-27 VITALS — BP 140/80 | HR 94 | Temp 98.7°F | Resp 16 | Ht 72.0 in | Wt 254.8 lb

## 2018-06-27 DIAGNOSIS — E119 Type 2 diabetes mellitus without complications: Secondary | ICD-10-CM | POA: Diagnosis not present

## 2018-06-27 DIAGNOSIS — I10 Essential (primary) hypertension: Secondary | ICD-10-CM | POA: Diagnosis not present

## 2018-06-27 DIAGNOSIS — R011 Cardiac murmur, unspecified: Secondary | ICD-10-CM

## 2018-06-27 DIAGNOSIS — E785 Hyperlipidemia, unspecified: Secondary | ICD-10-CM

## 2018-06-27 LAB — LIPID PANEL
Chol/HDL Ratio: 2.8 ratio (ref 0.0–5.0)
Cholesterol, Total: 125 mg/dL (ref 100–199)
HDL: 44 mg/dL (ref >39)
LDL Calculated: 55 mg/dL (ref 0–99)
Triglycerides: 132 mg/dL (ref 0–149)
VLDL Cholesterol Cal: 26 mg/dL (ref 5–40)

## 2018-06-27 LAB — COMPREHENSIVE METABOLIC PANEL
A/G RATIO: 1.8 (ref 1.2–2.2)
ALT: 33 IU/L (ref 0–44)
AST: 20 IU/L (ref 0–40)
Albumin: 4.7 g/dL (ref 3.7–4.7)
Alkaline Phosphatase: 48 IU/L (ref 39–117)
BUN / CREAT RATIO: 24 (ref 10–24)
BUN: 24 mg/dL (ref 8–27)
Bilirubin Total: 0.5 mg/dL (ref 0.0–1.2)
CO2: 22 mmol/L (ref 20–29)
Calcium: 9.6 mg/dL (ref 8.6–10.2)
Chloride: 104 mmol/L (ref 96–106)
Creatinine, Ser: 1.02 mg/dL (ref 0.76–1.27)
GFR calc Af Amer: 85 mL/min/{1.73_m2} (ref >59)
GFR calc non Af Amer: 74 mL/min/{1.73_m2} (ref >59)
Globulin, Total: 2.6 g/dL (ref 1.5–4.5)
Glucose: 140 mg/dL — ABNORMAL HIGH (ref 65–99)
Potassium: 4.4 mmol/L (ref 3.5–5.2)
Sodium: 142 mmol/L (ref 134–144)
TOTAL PROTEIN: 7.3 g/dL (ref 6.0–8.5)

## 2018-06-27 LAB — HEMOGLOBIN A1C
Est. average glucose Bld gHb Est-mCnc: 128 mg/dL
Hgb A1c MFr Bld: 6.1 % — ABNORMAL HIGH (ref 4.8–5.6)

## 2018-06-27 MED ORDER — ATORVASTATIN CALCIUM 10 MG PO TABS: 10.0000 mg | ORAL_TABLET | Freq: Every day | ORAL | 2 refills | Status: DC

## 2018-06-27 MED ORDER — METFORMIN HCL 500 MG PO TABS: 500.0000 mg | ORAL_TABLET | Freq: Every day | ORAL | 2 refills | Status: DC

## 2018-06-27 MED ORDER — LOSARTAN POTASSIUM 100 MG PO TABS: 100.0000 mg | ORAL_TABLET | Freq: Every day | ORAL | 2 refills | Status: DC

## 2018-06-27 MED ORDER — AMLODIPINE BESYLATE 5 MG PO TABS: ORAL_TABLET | ORAL | 2 refills | Status: DC

## 2018-06-27 NOTE — Progress Notes (Signed)
Subjective:    Patient ID: Jeffery George, male    DOB: Aug 12, 1945, 73 y.o.   MRN: [id number]  HPI Jeffery George is a 73 y.o. male Presents today for: Chief Complaint  Patient presents with   Diabetes    6 mon f/u   Hypertension    6 mon f/u    Diabetes: Metformin 5675m qd. No new side effects.  Home readings  100-118.  Microalbumin: in 2018, normal.  Optho, foot exam, pneumovax: up to date. Hx of retinal detachment - followed by optho - had surgery, doing well.  20/30 vision.   Lab Results  Component Value Date   HGBA1C 6.5 (H) 12/23/2017   HGBA1C 6.5 (H) 06/22/2017   HGBA1C 6.4 (H) 02/25/2017   Lab Results  Component Value Date   MICROALBUR 0.6 12/10/2014   LDLCALC 46 12/23/2017   CREATININE 0.84 12/23/2017    Hyperlipidemia:  Lab Results  Component Value Date   CHOL 110 12/23/2017   HDL 36 (L) 12/23/2017   LDLCALC 46 12/23/2017   TRIG 142 12/23/2017   CHOLHDL 3.1 12/23/2017   Lab Results  Component Value Date   ALT 34 12/23/2017   AST 22 12/23/2017   ALKPHOS 56 12/23/2017   BILITOT 0.6 12/23/2017  lipitor 129mqd, no new myalgia/side effects.   Hypertension: BP Readings from Last 3 Encounters:  06/27/18 140/80  12/23/17 (!) 146/78  08/12/17 138/77   Lab Results  Component Value Date   CREATININE 0.84 12/23/2017  norvasc 75m3mlosartan 100m12msa 81mg12m   Gout: No recent flairs. No daily meds, has colcrys if needed.  Lab Results  Component Value Date   LABURIC 5.5 12/23/2017     Patient Active Problem List   Diagnosis Date Noted   Left retinal detachment 08/11/2017   Gout 02/27/2013   Essential hypertension, benign 02/27/2013   Type II or unspecified type diabetes mellitus without mention of complication, uncontrolled 02/27/2013   Past Medical History:  Diagnosis Date   Arthritis    Di[place] complication ([place]    Hypertension    Past Surgical History:  Procedure Laterality Date    CATARACT EXTRACTION     FRACTURE SURGERY     Lt ankle   GAS/FLUID EXCHANGE Left 08/12/2017   Procedure: C3F8 GAS/FLUID EXCHANGE;  Surgeon: ZamorBernarda Caffey  [facility];  Laterality: Left;   HERNIA REPAIR     IRIDOTOMY / IRIDECTOMY Bilateral    stents   VASECTOMY     VITRECTOMY 25 GAUGE WITH SCLERAL BUCKLE Left 08/12/2017   Procedure: VITRECTOMY 25 GAUGE WITH SCLERAL BUCKLE WITH ENDOLASER;  Surgeon: ZamorBernarda Caffey  [facility];  Laterality: Left;   No Known Allergies Prior to Admission medications   Medication Sig Start Date End Date Taking? Authorizing Provider  ACCU-CHEK SOFTCLIX LANCETS lancets Test blood sugar once daily. Dx E11.9 08/26/15   GreenWendie Agreste amLODipine (NORVASC) 5 MG tablet TAKE 1 TABLET DAILY 12/23/17   GreenWendie Agreste aspirin EC 81 MG tablet Take 1 tablet (81 mg total) by mouth daily. 02/27/13   Shaw,Shawnee Knapp atorvastatin (LIPITOR) 10 MG tablet Take 1 tablet (10 mg total) by mouth daily. 12/23/17   GreenWendie Agreste blood glucose meter kit and supplies TrueMetrix, TruTrack, Accu-Chek Aviva Expert, Accu-Chek Aviva Plus, Accu-Chek Guide, Accu-Chek Nano, or Accu-Chek Smartview.   Use once  per day. 07/31/17   [name], MD  Blood Glucose Monitoring Suppl (BLOOD GLUCOSE MONITOR KIT) KIT Use to test blood sugar twice daily. 07/17/13   [name], MD  colchicine (COLCRYS) 0.6 MG tablet Take 2 tabs po at symptom onset. Repeat 1 tab po 1 hour later. 06/22/17   [name], MD  indomethacin (INDOCIN) 50 MG capsule TAKE 1 CAPSULE THREE TIMES A DAY AS NEEDED FOR MODERATE PAIN 06/22/17   [name], MD  Lancets [facility] ULTRASOFT) lancets Use as instructed 06/05/13   [name], MD  latanoprost (XALATAN) 0.005 % ophthalmic solution Place 1 drop into both eyes every evening.  07/23/17   [provider]  losartan (COZAAR) 100 MG tablet Take 1 tablet (100 mg  total) by mouth daily. 12/23/17   [name], MD  metFORMIN (GLUCOPHAGE) 500 MG tablet TAKE 1 TABLET ONE TIME DAILY WITH BREAKFAST 03/11/18   Horald Pollen, MD  Omega-3 Fatty Acids (FISH OIL) 1000 MG CAPS Take by mouth daily.     [provider]  TRUE METRIX BLOOD GLUCOSE TEST test strip TEST ONE TIME DAILY 03/11/18   [name], MD  TRUEPLUS LANCETS 33G MISC TEST ONE TIME DAILY 03/11/18   [name], MD   Social History   Socioeconomic History   Marital status: Married    Spouse name: Not on file   Number of children: Not on file   Years of education: Not on file   Highest education level: Not on file  Occupational History   Occupation: [employer] [place] resource strain: Not on file   Food insecurity:    Worry: Not on file    Inability: Not on file   Transportation needs:    Medical: Not on file    Non-medical: Not on file  Tobacco Use   Smoking status: Former Smoker   Smokeless tobacco: Never Used  Substance and Sexual Activity   Alcohol use: No    Alcohol/week: 0.0 standard drinks   Drug use: No   Sexual activity: Never  Lifestyle   Physical activity:    Days per week: Not on file    Minutes per session: Not on file   Stress: Not on file  Relationships   Social connections:    Talks on phone: Not on file    Gets together: Not on file    Attends religious service: Not on file    Active member of club or organization: Not on file    Attends meetings of clubs or organizations: Not on file    Relationship status: Not on file   Intimate partner violence:    Fear of current or ex partner: Not on file    Emotionally abused: Not on file    Physically abused: Not on file    Forced sexual activity: Not on file  Other Topics Concern   Not on file  Social History Narrative   Married   Counsellor at [employer]    Review of Systems  Constitutional: Negative for fatigue and  unexpected weight change.  Eyes: Negative for visual disturbance.  Respiratory: Negative for cough, chest tightness and shortness of breath.   Cardiovascular: Negative for chest pain, palpitations and leg swelling.  Gastrointestinal: Negative for abdominal pain and blood in stool.  Neurological: Negative for dizziness, light-headedness and headaches.       Objective:   Physical Exam Vitals signs reviewed.  Constitutional:      Appearance: He is well-developed.  HENT:     Head: Normocephalic and atraumatic.  Eyes:     Pupils: Pupils are equal, round, and reactive to light.  Neck:     Vascular: No carotid bruit or JVD.  Cardiovascular:     Rate and Rhythm: Normal rate and regular rhythm.     Heart sounds: Murmur (2/6 systolic. ) present.  Pulmonary:     Effort: Pulmonary effort is normal.     Breath sounds: Normal breath sounds. No rales.  Skin:    General: Skin is warm and dry.  Neurological:     Mental Status: He is alert and oriented to person, place, and time.        Assessment & Plan:   Jeffery George is a 73 y.o. male Type 2 diabetes mellitus without complication, without long-term current use of insulin ([place]) - Plan: Hemoglobin A1c, Microalbumin / creatinine urine ratio Controlled type 2 diabetes mellitus without complication, without long-term current use of insulin ([place]) - Plan: metFORMIN (GLUCOPHAGE) 500 MG tablet  -  Stable, tolerating current regimen. Medications refilled. Labs pending as above.   Essential hypertension, benign - Plan: Lipid Panel, Comprehensive metabolic panel, amLODipine (NORVASC) 5 MG tablet, losartan (COZAAR) 100 MG tablet  -  Stable, tolerating current regimen. Medications refilled. Labs pending as above.   Heart murmur - Plan: Ambulatory referral to Cardiology  -Previously discussed eval by cardiology, asymptomatic.  Will place referral again to decide on imaging/work-up.  Hyperlipidemia, unspecified hyperlipidemia type - Plan: Lipid  Panel, Comprehensive metabolic panel, atorvastatin (LIPITOR) 10 MG tablet  -Tolerating current regimen, no changes.  Meds ordered this encounter  Medications   amLODipine (NORVASC) 5 MG tablet    Sig: TAKE 1 TABLET DAILY    Dispense:  90 tablet    Refill:  2   atorvastatin (LIPITOR) 10 MG tablet    Sig: Take 1 tablet (10 mg total) by mouth daily.    Dispense:  90 tablet    Refill:  2   metFORMIN (GLUCOPHAGE) 500 MG tablet    Sig: Take 1 tablet (500 mg total) by mouth daily with breakfast.    Dispense:  90 tablet    Refill:  2   losartan (COZAAR) 100 MG tablet    Sig: Take 1 tablet (100 mg total) by mouth daily.    Dispense:  90 tablet    Refill:  2   Patient Instructions    I will refer you to cardiology again for the heart murmur. No med changes for now. Thanks for coming in today.    If you have lab work done today you will be contacted with your lab results within the next 2 weeks.  If you have not heard from [place] then please contact us. The fastest way to get your results is to register for My Chart.   IF you received an x-ray today, you will receive an invoice from [facility]. Please contact [facility] at [phone] with questions or concerns regarding your invoice.   IF you received labwork today, you will receive an invoice from [place]. Please contact [facility] at [phone] with questions or concerns regarding your invoice.   Our billing staff will not be able to assist you with questions regarding bills from these companies.  You will be contacted with the lab results as soon as they are available. The fastest way to get your results is to activate your My Chart account. Instructions  are located on the last page of this paperwork. If you have not heard from [place] regarding the results in 2 weeks, please contact this office.       Signed,   Merri Ray, MD [facility].  06/29/18 3:28 PM

## 2018-06-27 NOTE — Patient Instructions (Addendum)
  I will refer you to cardiology again for the heart murmur. No med changes for now. Thanks for coming in today.    If you have lab work done today you will be contacted with your lab results within the next 2 weeks.  If you have not heard from [place] then please contact us. The fastest way to get your results is to register for My Chart.   IF you received an x-ray today, you will receive an invoice from [facility]. Please contact [facility] at [phone] with questions or concerns regarding your invoice.   IF you received labwork today, you will receive an invoice from [place]. Please contact [facility] at [phone] with questions or concerns regarding your invoice.   Our billing staff will not be able to assist you with questions regarding bills from these companies.  You will be contacted with the lab results as soon as they are available. The fastest way to get your results is to activate your My Chart account. Instructions are located on the last page of this paperwork. If you have not heard from [place] regarding the results in 2 weeks, please contact this office.

## 2018-06-28 LAB — MICROALBUMIN / CREATININE URINE RATIO
Creatinine, Urine: 121.6 mg/dL
MICROALBUM., U, RANDOM: 10.7 ug/mL
Microalb/Creat Ratio: 9 mg/g creat (ref 0–29)

## 2018-07-06 ENCOUNTER — Encounter: Payer: Self-pay | Admitting: Radiology

## 2018-08-09 ENCOUNTER — Ambulatory Visit (INDEPENDENT_AMBULATORY_CARE_PROVIDER_SITE_OTHER): Payer: No Typology Code available for payment source

## 2018-08-09 ENCOUNTER — Ambulatory Visit (INDEPENDENT_AMBULATORY_CARE_PROVIDER_SITE_OTHER): Payer: No Typology Code available for payment source | Admitting: Orthopaedic Surgery

## 2018-08-09 ENCOUNTER — Encounter (INDEPENDENT_AMBULATORY_CARE_PROVIDER_SITE_OTHER): Payer: Self-pay | Admitting: Orthopaedic Surgery

## 2018-08-09 ENCOUNTER — Other Ambulatory Visit: Payer: Self-pay

## 2018-08-09 VITALS — Ht 72.0 in | Wt 255.0 lb

## 2018-08-09 DIAGNOSIS — M25512 Pain in left shoulder: Secondary | ICD-10-CM

## 2018-08-09 DIAGNOSIS — G8929 Other chronic pain: Secondary | ICD-10-CM

## 2018-08-09 MED ORDER — LIDOCAINE HCL 2 % IJ SOLN
2.0000 mL | INTRAMUSCULAR | Status: AC | PRN
  Administered 2018-08-09: 2 mL

## 2018-08-09 MED ORDER — METHYLPREDNISOLONE ACETATE 40 MG/ML IJ SUSP
40.0000 mg | INTRAMUSCULAR | Status: AC | PRN
  Administered 2018-08-09: 10:00:00 40 mg via INTRA_ARTICULAR

## 2018-08-09 MED ORDER — BUPIVACAINE HCL 0.25 % IJ SOLN
2.0000 mL | INTRAMUSCULAR | Status: AC | PRN
  Administered 2018-08-09: 10:00:00 2 mL via INTRA_ARTICULAR

## 2018-08-09 NOTE — Progress Notes (Signed)
Office Visit Note   Patient: Jeffery George           Date of Birth: 1945-10-20           MRN: [id number] Visit Date: 08/09/2018              Requested by: [name], MD [address] [place],  [address] PCP: [name], MD   Assessment & Plan: Visit Diagnoses:  1. Chronic left shoulder pain     Plan: Impression is left shoulder subacromial bursitis and rotator cuff tendinitis.  Injected the left subacromial space with cortisone today.  I have advised him to take the next week off of formal physical therapy to let the injection kick in.  He will call us in the next 2 weeks if his symptoms have not improved, at that point we will obtain an MRI to further assess his rotator cuff.  Otherwise, he will follow-up with [place] in 6 weeks time for recheck.  A work note was provided today for light duty for the next 6 weeks.  Call with concerns or questions in the meantime.  Follow-Up Instructions: Return in about 6 weeks (around 09/20/2018).   Orders:  Orders Placed This Encounter  Procedures  . Large Joint Inj: L subacromial bursa  . XR Shoulder Left   No orders of the defined types were placed in this encounter.     Procedures: Large Joint Inj: L subacromial bursa on 08/09/2018 10:14 AM Indications: pain Details: 22 G needle Medications: 2 mL bupivacaine 0.25 %; 2 mL lidocaine 2 %; 40 mg methylPREDNISolone acetate 40 MG/ML Outcome: tolerated well, no immediate complications Patient was prepped and draped in the usual sterile fashion.       Clinical Data: No additional findings.   Subjective: Chief Complaint  Patient presents with  . Left Shoulder - Pain    HPI patient is a pleasant right-hand-dominant gentleman who presents our clinic today following an injury to his left shoulder.  This is being filed under [employer].  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  On 12/30/2017, he was at work when he sustained a mechanical fall onto his left shoulder.   He notes that a few days following the injury, his shoulder started to improve.  Several months later, another employee was on light duty so he had to do the majority of the heavy lifting.  His symptoms started to return and have since worsened.  He was seen in urgent care on 07/25/2018 where x-rays were obtained.  These were negative for fracture.  He was given Tylenol and sent to formal physical therapy.  He has been to 1 eval visit.  They also put him on light duty which he thinks has been helping.  The pain he is complaining about today is deep within the shoulder joint itself radiating into the deltoid.  He denies any weakness.  Any movement of the shoulder away from his side seems to worsen his symptoms.  He also has significant pain sleeping on the left side.  He has taken Tylenol without relief of symptoms.  He denies any numbness, tingling or burning.  No previous injection or surgical intervention to either shoulder.  Review of Systems as detailed in HPI.  All others reviewed and are negative.   Objective: Vital Signs: Ht 6' (1.829 m)   Wt 255 lb (115.7 kg)   BMI 34.58 kg/m   Physical Exam well-developed well-nourished gentleman in no acute distress.  Alert  and oriented x3.  Ortho Exam examination of his left shoulder reveals approximately 75% active range of motion all planes.  He can internally rotate to L5.  He has full strength throughout.  Markedly positive empty can and cross body abduction.  Negative drop arm.  He is neurovascularly intact distally.  Specialty Comments:  No specialty comments available.  Imaging: Xr Shoulder Left  Result Date: 08/09/2018 X-rays of the left shoulder are negative for acute findings.  No superior migration humeral head.    PMFS History: Patient Active Problem List   Diagnosis Date Noted  . Chronic left shoulder pain 08/09/2018  . Left retinal detachment 08/11/2017  . Gout 02/27/2013  . Essential hypertension, benign 02/27/2013  . Type II  or unspecified type diabetes mellitus without mention of complication, uncontrolled 02/27/2013   Past Medical History:  Diagnosis Date  . Arthritis   . Diabetes mellitus without complication ([place])   . Heart murmur   . Hypertension     Family History  Problem Relation Age of Onset  . Cancer Mother   . Diabetes Mother   . Stroke Mother   . Heart disease Father   . Stroke Father   . Diabetes Brother   . Heart disease Brother   . Diabetes Brother   . Colon cancer Neg Hx     Past Surgical History:  Procedure Laterality Date  . CATARACT EXTRACTION    . FRACTURE SURGERY     Lt ankle  . GAS/FLUID EXCHANGE Left 08/12/2017   Procedure: C3F8 GAS/FLUID EXCHANGE;  Surgeon: Bernarda Caffey, MD;  Location: [place];  Service: Ophthalmology;  Laterality: Left;  . HERNIA REPAIR    . IRIDOTOMY / IRIDECTOMY Bilateral    stents  . VASECTOMY    . VITRECTOMY 25 GAUGE WITH SCLERAL BUCKLE Left 08/12/2017   Procedure: VITRECTOMY 25 GAUGE WITH SCLERAL BUCKLE WITH ENDOLASER;  Surgeon: Bernarda Caffey, MD;  Location: [place];  Service: Ophthalmology;  Laterality: Left;   Social History   Occupational History  . Occupation: Educational psychologist  Tobacco Use  . Smoking status: Former Research scientist (life sciences)  . Smokeless tobacco: Never Used  Substance and Sexual Activity  . Alcohol use: No    Alcohol/week: 0.0 standard drinks  . Drug use: No  . Sexual activity: Never

## 2018-10-04 ENCOUNTER — Ambulatory Visit (INDEPENDENT_AMBULATORY_CARE_PROVIDER_SITE_OTHER): Payer: No Typology Code available for payment source | Admitting: Orthopaedic Surgery

## 2018-10-04 ENCOUNTER — Telehealth: Payer: Self-pay | Admitting: Orthopaedic Surgery

## 2018-10-04 ENCOUNTER — Other Ambulatory Visit: Payer: Self-pay

## 2018-10-04 ENCOUNTER — Encounter: Payer: Self-pay | Admitting: Orthopaedic Surgery

## 2018-10-04 DIAGNOSIS — G8929 Other chronic pain: Secondary | ICD-10-CM | POA: Diagnosis not present

## 2018-10-04 DIAGNOSIS — M25512 Pain in left shoulder: Secondary | ICD-10-CM

## 2018-10-04 NOTE — Telephone Encounter (Signed)
Pt came into his appt today and said it was WC but I couldn't find any information on it.  I tried to give you a call but I think you had left for the day.

## 2018-10-04 NOTE — Progress Notes (Signed)
Office Visit Note   Patient: Jeffery George           Date of Birth: Nov 16, 1945           MRN: [id number] Visit Date: 10/04/2018              Requested by: [name], MD [address] [place],  [place] [address] PCP: [name], MD   Assessment & Plan: Visit Diagnoses:  1. Chronic left shoulder pain     Plan: Impression is continued left shoulder pain concerning for rotator cuff tear.  My impression is that he may have a small full-thickness tear and likely tendinosis of the rotator cuff.  We will need to obtain an MRI to fully evaluate for this.  We will see him back afterwards.  Work note was provided today for no lifting more than 10 pounds and no lifting at all above the head.  Follow-Up Instructions: Return for MRI review of left shoulder.   Orders:  Orders Placed This Encounter  Procedures  . MR Shoulder Left w/o contrast   No orders of the defined types were placed in this encounter.     Procedures: No procedures performed   Clinical Data: No additional findings.   Subjective: Chief Complaint  Patient presents with  . Left Shoulder - Pain, Follow-up    Jeffery George returns today for evaluation of his left shoulder pain.  We previously saw him on 08/09/2018 and provide him with a subacromial injection which helped tremendously for the first several weeks.  The pain has returned especially with lifting and carrying heavy objects.  This is a workers comp case.   Review of Systems  Constitutional: Negative.   All other systems reviewed and are negative.    Objective: Vital Signs: There were no vitals taken for this visit.  Physical Exam Vitals signs and nursing note reviewed.  Constitutional:      Appearance: He is well-developed.  Pulmonary:     Effort: Pulmonary effort is normal.  Abdominal:     Palpations: Abdomen is soft.  Skin:    General: Skin is warm.  Neurological:     Mental Status: He is alert and oriented to person, place,  and time.  Psychiatric:        Behavior: Behavior normal.        Thought Content: Thought content normal.        Judgment: Judgment normal.     Ortho Exam  Left shoulder exam shows pain with empty can testing with slight weakness due to pain.  Mild weakness with infraspinatus testing Specialty Comments:  No specialty comments available.  Imaging: No results found.   PMFS History: Patient Active Problem List   Diagnosis Date Noted  . Chronic left shoulder pain 08/09/2018  . Left retinal detachment 08/11/2017  . Gout 02/27/2013  . Essential hypertension, benign 02/27/2013  . Type II or unspecified type diabetes mellitus without mention of complication, uncontrolled 02/27/2013   Past Medical History:  Diagnosis Date  . Arthritis   . Diabetes mellitus without complication ([place])   . Heart murmur   . Hypertension     Family History  Problem Relation Age of Onset  . Cancer Mother   . Diabetes Mother   . Stroke Mother   . Heart disease Father   . Stroke Father   . Diabetes Brother   . Heart disease Brother   . Diabetes Brother   . Colon cancer Neg Hx  Past Surgical History:  Procedure Laterality Date  . CATARACT EXTRACTION    . FRACTURE SURGERY     Lt ankle  . GAS/FLUID EXCHANGE Left 08/12/2017   Procedure: C3F8 GAS/FLUID EXCHANGE;  Surgeon: Bernarda Caffey, MD;  Location: [place];  Service: Ophthalmology;  Laterality: Left;  . HERNIA REPAIR    . IRIDOTOMY / IRIDECTOMY Bilateral    stents  . VASECTOMY    . VITRECTOMY 25 GAUGE WITH SCLERAL BUCKLE Left 08/12/2017   Procedure: VITRECTOMY 25 GAUGE WITH SCLERAL BUCKLE WITH ENDOLASER;  Surgeon: Bernarda Caffey, MD;  Location: [place];  Service: Ophthalmology;  Laterality: Left;   Social History   Occupational History  . Occupation: Educational psychologist  Tobacco Use  . Smoking status: Former Research scientist (life sciences)  . Smokeless tobacco: Never Used  Substance and Sexual Activity  . Alcohol use: No    Alcohol/week: 0.0 standard drinks  .  Drug use: No  . Sexual activity: Never

## 2018-10-06 IMAGING — CR DG CHEST 2V
2 series · 2 of 2 positions shown · non-contrast
Comparison: None.

CLINICAL DATA: Shortness of breath with congestion

EXAM:
CHEST - 2 VIEW

[w chest pa]
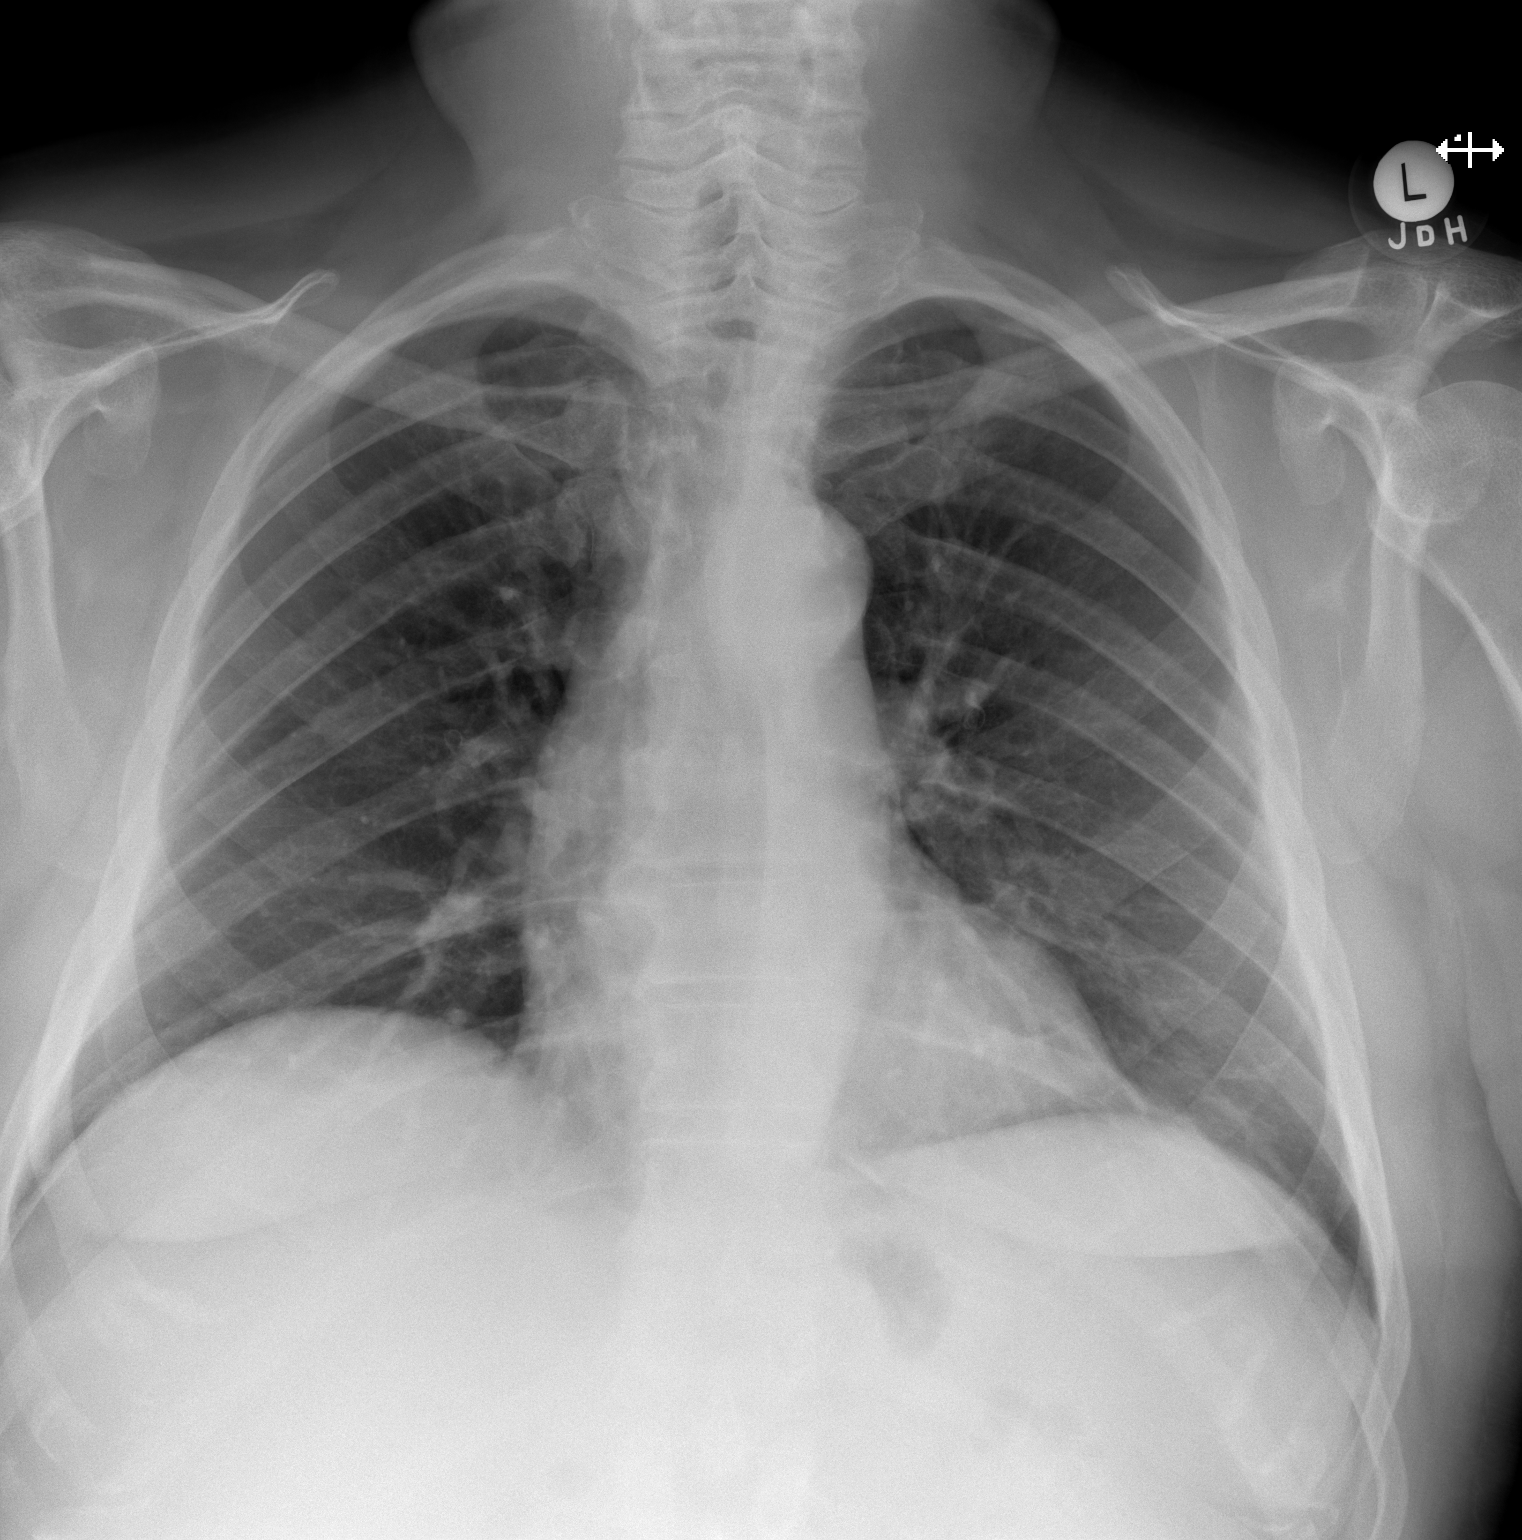

[w chest lat]
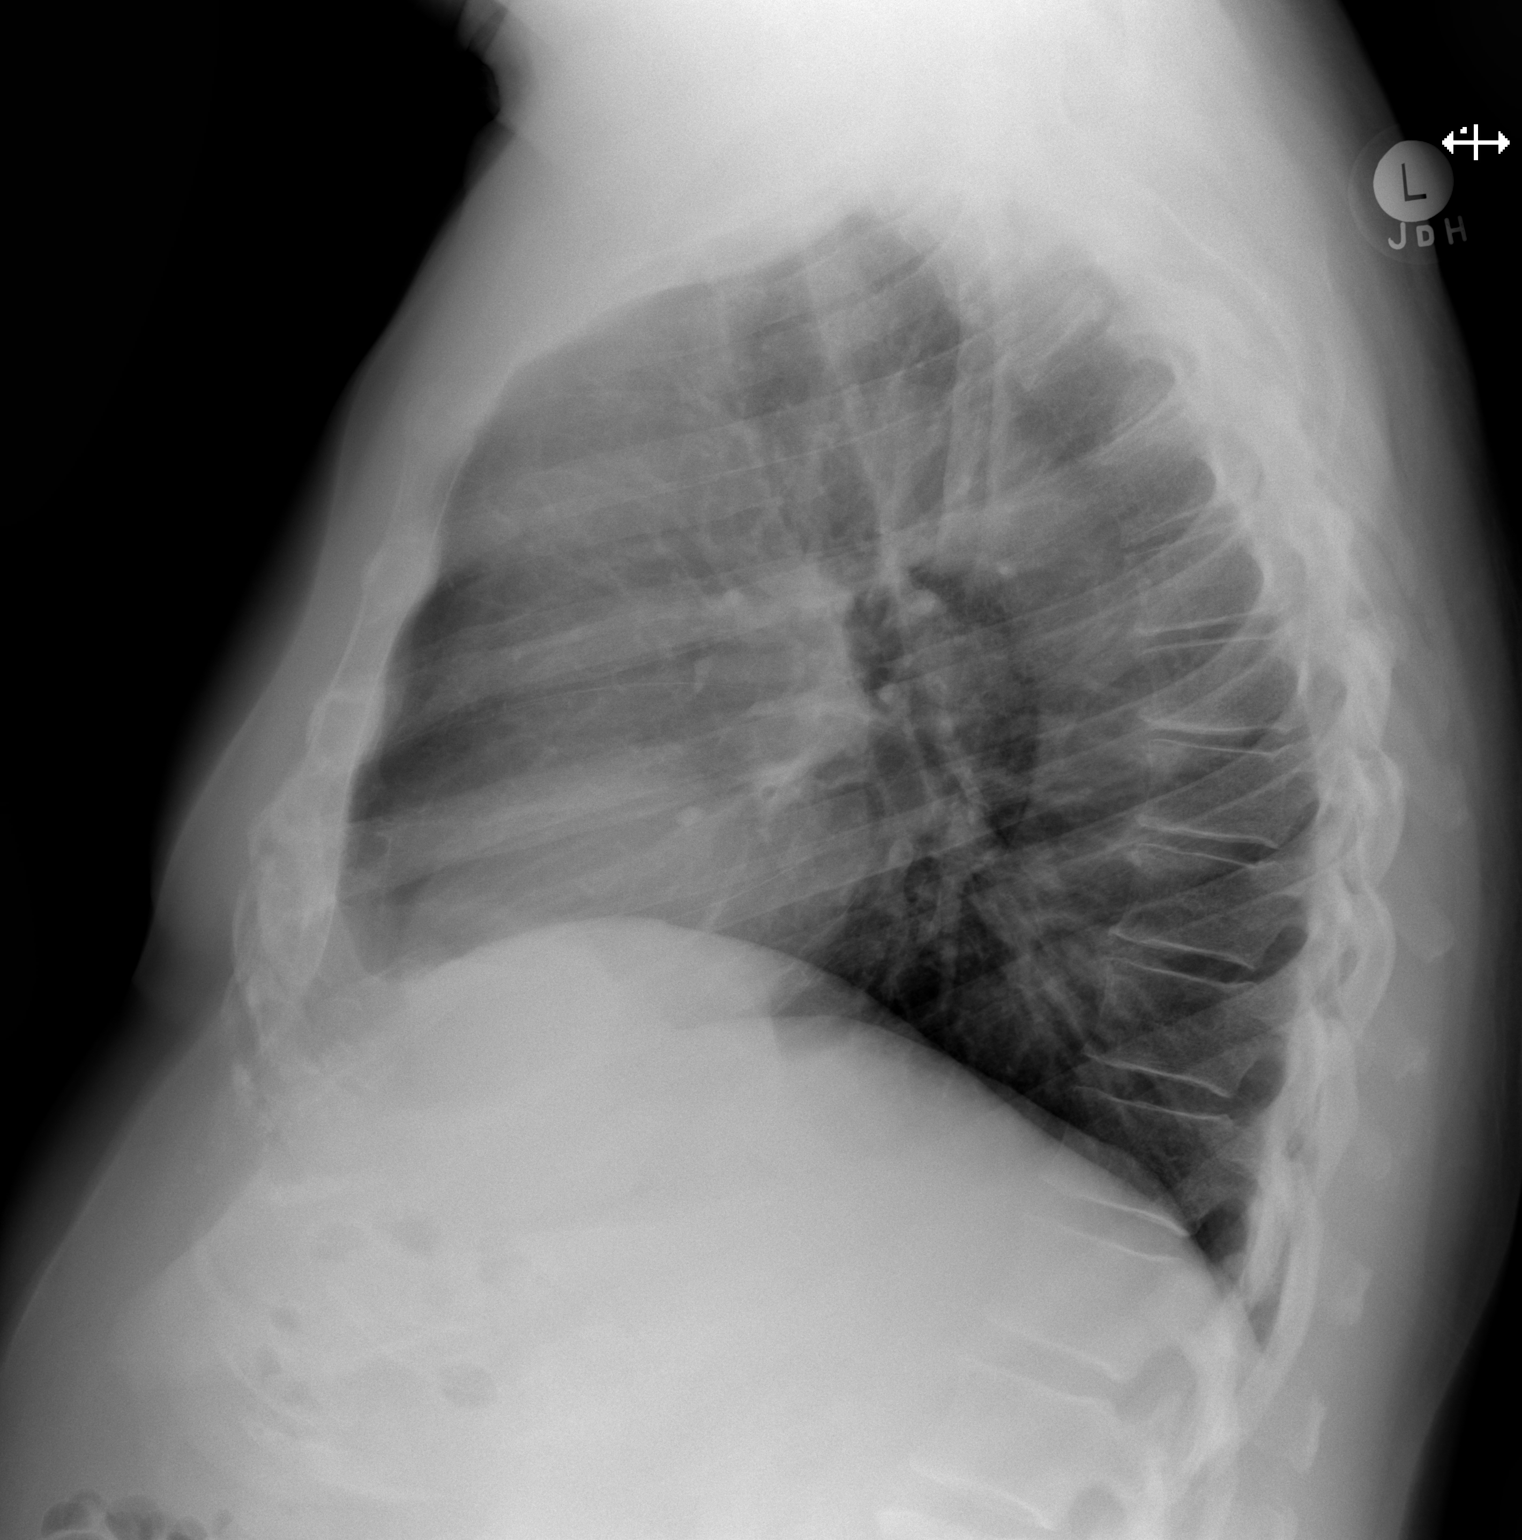

[2 of 2 positions shown; findings below may reference images not displayed]

FINDINGS: Lungs are clear. Heart size and pulmonary vascularity are normal. No
adenopathy. No bone lesions.
IMPRESSION: No edema or consolidation.

## 2018-11-02 ENCOUNTER — Telehealth: Payer: Self-pay | Admitting: Family Medicine

## 2018-11-02 NOTE — Telephone Encounter (Signed)
Left 3 VM to reschedule appt for 01/03/2019 due to provider being out of office. Cancelled appt

## 2018-11-10 ENCOUNTER — Ambulatory Visit: Payer: No Typology Code available for payment source | Admitting: Physician Assistant

## 2018-11-10 ENCOUNTER — Other Ambulatory Visit: Payer: Self-pay

## 2018-11-15 ENCOUNTER — Ambulatory Visit (INDEPENDENT_AMBULATORY_CARE_PROVIDER_SITE_OTHER): Payer: No Typology Code available for payment source | Admitting: Orthopaedic Surgery

## 2018-11-15 ENCOUNTER — Encounter: Payer: Self-pay | Admitting: Orthopaedic Surgery

## 2018-11-15 ENCOUNTER — Other Ambulatory Visit: Payer: Self-pay

## 2018-11-15 VITALS — Ht 72.0 in | Wt 255.0 lb

## 2018-11-15 DIAGNOSIS — M75102 Unspecified rotator cuff tear or rupture of left shoulder, not specified as traumatic: Secondary | ICD-10-CM

## 2018-11-15 NOTE — Progress Notes (Signed)
Office Visit Note   Patient: Jeffery George           Date of Birth: 11-24-1945           MRN: [id number] Visit Date: 11/15/2018              Requested by: [name], MD [address] [place],  [place] [address] PCP: [name], MD   Assessment & Plan: Visit Diagnoses:  1. Rotator cuff syndrome, left     Plan: MRI independently reviewed and interpreted shows a large full-thickness retracted tear of the supraspinatus with atrophy.  He also has severe tendinosis of the subscapularis tendon and bicipital tenosynovitis.  Given these findings my impression is that he suffered an acute on chronic injury to his rotator cuff.  Fortunately his function has improved significantly with outpatient physical therapy.  Given the atrophy and the age and the improvement with physical therapy surgery is unlikely going to give him much benefit.  I would recommend continued outpatient physical therapy until he reaches MMI.  He does need to be on light duty with no lifting of his left arm for work.  He did get really good relief from previous subacromial injection back in April but he declined another one today.  I would like to recheck him in 2 months.  Questions encouraged and answered. Total face to face encounter time was greater than 25 minutes and over half of this time was spent in counseling and/or coordination of care.  Follow-Up Instructions: Return in about 2 months (around 01/15/2019).   Orders:  No orders of the defined types were placed in this encounter.  No orders of the defined types were placed in this encounter.     Procedures: No procedures performed   Clinical Data: No additional findings.   Subjective: Chief Complaint  Patient presents with  . Left Shoulder - Follow-up    MRI results    Jeffery George returns today for MRI review of his left shoulder.  He rarely takes Aleve.  He is currently working [employer].     Review of Systems   Objective:  Vital Signs: Ht 6' (1.829 m)   Wt 255 lb (115.7 kg)   BMI 34.58 kg/m   Physical Exam  Ortho Exam Left shoulder exam shows intact ability to raise his arm above the shoulder about 10 to 15 degrees with some mild discomfort.  Forward flexion is about the same.  Specialty Comments:  No specialty comments available.  Imaging: No results found.   PMFS History: Patient Active Problem List   Diagnosis Date Noted  . Rotator cuff syndrome, left 11/15/2018  . Chronic left shoulder pain 08/09/2018  . Left retinal detachment 08/11/2017  . Gout 02/27/2013  . Essential hypertension, benign 02/27/2013  . Type II or unspecified type diabetes mellitus without mention of complication, uncontrolled 02/27/2013   Past Medical History:  Diagnosis Date  . Arthritis   . Diabetes mellitus without complication ([place])   . Heart murmur   . Hypertension     Family History  Problem Relation Age of Onset  . Cancer Mother   . Diabetes Mother   . Stroke Mother   . Heart disease Father   . Stroke Father   . Diabetes Brother   . Heart disease Brother   . Diabetes Brother   . Colon cancer Neg Hx     Past Surgical History:  Procedure Laterality Date  . CATARACT EXTRACTION    .  FRACTURE SURGERY     Lt ankle  . GAS/FLUID EXCHANGE Left 08/12/2017   Procedure: C3F8 GAS/FLUID EXCHANGE;  Surgeon: Bernarda Caffey, MD;  Location: [facility];  Service: Ophthalmology;  Laterality: Left;  . HERNIA REPAIR    . IRIDOTOMY / IRIDECTOMY Bilateral    stents  . VASECTOMY    . VITRECTOMY 25 GAUGE WITH SCLERAL BUCKLE Left 08/12/2017   Procedure: VITRECTOMY 25 GAUGE WITH SCLERAL BUCKLE WITH ENDOLASER;  Surgeon: Bernarda Caffey, MD;  Location: [facility];  Service: Ophthalmology;  Laterality: Left;   Social History   Occupational History  . Occupation: Educational psychologist  Tobacco Use  . Smoking status: Former Research scientist (life sciences)  . Smokeless tobacco: Never Used  Substance and Sexual Activity  . Alcohol use: No    Alcohol/week: 0.0  standard drinks  . Drug use: No  . Sexual activity: Never

## 2018-12-08 ENCOUNTER — Other Ambulatory Visit: Payer: Self-pay

## 2018-12-08 ENCOUNTER — Encounter: Payer: Self-pay | Admitting: Family Medicine

## 2018-12-08 ENCOUNTER — Ambulatory Visit (INDEPENDENT_AMBULATORY_CARE_PROVIDER_SITE_OTHER): Payer: Medicare HMO | Admitting: Family Medicine

## 2018-12-08 VITALS — BP 140/70 | HR 69 | Temp 98.8°F | Resp 17 | Ht 72.0 in | Wt 250.6 lb

## 2018-12-08 DIAGNOSIS — E785 Hyperlipidemia, unspecified: Secondary | ICD-10-CM | POA: Diagnosis not present

## 2018-12-08 DIAGNOSIS — M109 Gout, unspecified: Secondary | ICD-10-CM

## 2018-12-08 DIAGNOSIS — I10 Essential (primary) hypertension: Secondary | ICD-10-CM | POA: Diagnosis not present

## 2018-12-08 DIAGNOSIS — E119 Type 2 diabetes mellitus without complications: Secondary | ICD-10-CM

## 2018-12-08 MED ORDER — LOSARTAN POTASSIUM 100 MG PO TABS: 100.0000 mg | ORAL_TABLET | Freq: Every day | ORAL | 2 refills | Status: DC

## 2018-12-08 MED ORDER — METFORMIN HCL 500 MG PO TABS: 500.0000 mg | ORAL_TABLET | Freq: Every day | ORAL | 2 refills | Status: DC

## 2018-12-08 MED ORDER — ATORVASTATIN CALCIUM 10 MG PO TABS: 10.0000 mg | ORAL_TABLET | Freq: Every day | ORAL | 2 refills | Status: DC

## 2018-12-08 MED ORDER — AMLODIPINE BESYLATE 5 MG PO TABS: ORAL_TABLET | ORAL | 2 refills | Status: DC

## 2018-12-08 NOTE — Progress Notes (Signed)
Subjective:    Patient ID: Jeffery George, male    DOB: December 07, 1945, 73 y.o.   MRN: [id number]  HPI Jeffery George is a 73 y.o. male Presents today for: Chief Complaint  Patient presents with  . Medication Refill    norvasc, asa, lipitor, cozaar, glucophage.  No other concerns per pt.   Hypertension: BP Readings from Last 3 Encounters:  12/08/18 140/70  06/27/18 140/80  12/23/17 (!) 146/78   Lab Results  Component Value Date   CREATININE 1.02 06/27/2018  norvasc 36m, losartan 1072m- both QD.  No new side effects, No home readings.  ASA 81 mg qd.   Wt Readings from Last 3 Encounters:  12/08/18 250 lb 9.6 oz (113.7 kg)  11/15/18 255 lb (115.7 kg)  08/09/18 255 lb (115.7 kg)     Diabetes:  Metformin 50032mD.  Home readings -110-122.  No symptomatic lows.  Microalbumin:nl in 06/2018.  Optho, foot exam, pneumovax: up to date.  On ARB and statin.  Exercise at work and has adjusted diet.   Lab Results  Component Value Date   HGBA1C 6.1 (H) 06/27/2018   HGBA1C 6.5 (H) 12/23/2017   HGBA1C 6.5 (H) 06/22/2017   Lab Results  Component Value Date   MICROALBUR 0.6 12/10/2014   LDLCALC 55 06/27/2018   CREATININE 1.02 06/27/2018   Gout: Last flare: long time ago. Rare pain in toes.  Daily meds: Prn med: coclrys, occasional need for indocin.  Lab Results  Component Value Date   LABURIC 5.5 12/23/2017   Hyperlipidemia:  Lab Results  Component Value Date   CHOL 125 06/27/2018   HDL 44 06/27/2018   LDLCALC 55 06/27/2018   TRIG 132 06/27/2018   CHOLHDL 2.8 06/27/2018   Lab Results  Component Value Date   ALT 33 06/27/2018   AST 20 06/27/2018   ALKPHOS 48 06/27/2018   BILITOT 0.5 06/27/2018  lipitor 103m107m, no new myalgias/side effects.     Patient Active Problem List   Diagnosis Date Noted  . Rotator cuff syndrome, left 11/15/2018  . Chronic left shoulder pain 08/09/2018  . Left retinal detachment 08/11/2017  . Gout 02/27/2013  . Essential  hypertension, benign 02/27/2013  . Type II or unspecified type diabetes mellitus without mention of complication, uncontrolled 02/27/2013   Past Medical History:  Diagnosis Date  . Arthritis   . Diabetes mellitus without complication ([facility])[place]. Heart murmur   . Hypertension    Past Surgical History:  Procedure Laterality Date  . CATARACT EXTRACTION    . FRACTURE SURGERY     Lt ankle  . GAS/FLUID EXCHANGE Left 08/12/2017   Procedure: C3F8 GAS/FLUID EXCHANGE;  Surgeon: ZamoBernarda Caffey;  [facility];  Laterality: Left;  . HERNIA REPAIR    . IRIDOTOMY / IRIDECTOMY Bilateral    stents  . VASECTOMY    . VITRECTOMY 25 GAUGE WITH SCLERAL BUCKLE Left 08/12/2017   Procedure: VITRECTOMY 25 GAUGE WITH SCLERAL BUCKLE WITH ENDOLASER;  Surgeon: ZamoBernarda Caffey;  [facility];  Laterality: Left;   No Known Allergies Prior to Admission medications   Medication Sig Start Date End Date Taking? Authorizing Provider  ACCU-CHEK SOFTCLIX LANCETS lancets Test blood sugar once daily. Dx E11.9 08/26/15  Yes GreeWendie Agreste  amLODipine (NORVASC) 5 MG tablet TAKE 1 TABLET DAILY 06/27/18  Yes GreeWendie Agreste  aspirin EC 81 MG tablet Take 1 tablet (81 mg  total) by mouth daily. 02/27/13  Yes Shawnee Knapp, MD  atorvastatin (LIPITOR) 10 MG tablet Take 1 tablet (10 mg total) by mouth daily. 06/27/18  Yes [name], MD  blood glucose meter kit and supplies TrueMetrix, TruTrack, Accu-Chek Aviva Expert, Accu-Chek Aviva Plus, Accu-Chek Guide, Accu-Chek Nano, or Accu-Chek Smartview.   Use once per day. 07/31/17  Yes [name], MD  losartan (COZAAR) 100 MG tablet Take 1 tablet (100 mg total) by mouth daily. 06/27/18  Yes [name], MD  metFORMIN (GLUCOPHAGE) 500 MG tablet Take 1 tablet (500 mg total) by mouth daily with breakfast. 06/27/18  Yes [name], MD  TRUE METRIX BLOOD GLUCOSE TEST test strip TEST ONE TIME DAILY 03/11/18   Yes [name], MD  TRUEPLUS LANCETS 33G MISC TEST ONE TIME DAILY 03/11/18  Yes [name], MD  Blood Glucose Monitoring Suppl (BLOOD GLUCOSE MONITOR KIT) KIT Use to test blood sugar twice daily. Patient not taking: Reported on 12/08/2018 07/17/13   [name], MD  colchicine (COLCRYS) 0.6 MG tablet Take 2 tabs po at symptom onset. Repeat 1 tab po 1 hour later. Patient not taking: Reported on 12/08/2018 06/22/17   [name], MD  indomethacin (INDOCIN) 50 MG capsule TAKE 1 CAPSULE THREE TIMES A DAY AS NEEDED FOR MODERATE PAIN Patient not taking: Reported on 12/08/2018 06/22/17   [name], MD  Lancets [facility] ULTRASOFT) lancets Use as instructed Patient not taking: Reported on 12/08/2018 06/05/13   [name], MD  latanoprost (XALATAN) 0.005 % ophthalmic solution Place 1 drop into both eyes every evening.  07/23/17   [provider]  Omega-3 Fatty Acids (FISH OIL) 1000 MG CAPS Take by mouth daily.     [provider]   Social History   Socioeconomic History  . Marital status: Married    Spouse name: Not on file  . Number of children: Not on file  . Years of education: Not on file  . Highest education level: Not on file  Occupational History  . Occupation: Educational psychologist  Social Needs  . Financial resource strain: Not on file  . Food insecurity    Worry: Not on file    Inability: Not on file  . Transportation needs    Medical: Not on file    Non-medical: Not on file  Tobacco Use  . Smoking status: Former Research scientist (life sciences)  . Smokeless tobacco: Never Used  Substance and Sexual Activity  . Alcohol use: No    Alcohol/week: 0.0 standard drinks  . Drug use: No  . Sexual activity: Never  Lifestyle  . Physical activity    Days per week: Not on file    Minutes per session: Not on file  . Stress: Not on file  Relationships  . Social Herbalist on phone: Not on file    Gets together: Not on file    Attends religious  service: Not on file    Active member of club or organization: Not on file    Attends meetings of clubs or organizations: Not on file    Relationship status: Not on file  . Intimate partner violence    Fear of current or ex partner: Not on file    Emotionally abused: Not on file    Physically abused: Not on file    Forced sexual activity: Not on file  Other Topics Concern  . Not on file  Social History Narrative  Married   Counsellor at [employer]    Review of Systems  Constitutional: Negative for fatigue and unexpected weight change.  Eyes: Negative for visual disturbance.  Respiratory: Negative for cough, chest tightness and shortness of breath.   Cardiovascular: Negative for chest pain, palpitations and leg swelling.  Gastrointestinal: Negative for abdominal pain and blood in stool.  Neurological: Negative for dizziness, light-headedness and headaches.       Objective:   Physical Exam Vitals signs reviewed.  Constitutional:      Appearance: He is well-developed.  HENT:     Head: Normocephalic and atraumatic.  Eyes:     Pupils: Pupils are equal, round, and reactive to light.  Neck:     Vascular: No carotid bruit or JVD.  Cardiovascular:     Rate and Rhythm: Normal rate and regular rhythm.     Heart sounds: Murmur (6-3/8 systolic. ) present.  Pulmonary:     Effort: Pulmonary effort is normal.     Breath sounds: Normal breath sounds. No rales.  Skin:    General: Skin is warm and dry.  Neurological:     Mental Status: He is alert and oriented to person, place, and time.    Vitals:   12/08/18 0844 12/08/18 0850  BP: (!) 154/84 140/70  Pulse: 69   Resp: 17   Temp: 98.8 F (37.1 C)   TempSrc: Oral   SpO2: 96%   Weight: 250 lb 9.6 oz (113.7 kg)   Height: 6' (1.829 m)        Assessment & Plan:   Jeffery George is a 73 y.o. male Type 2 diabetes mellitus without complication, without long-term current use of insulin ([place]) - Plan: Comprehensive  metabolic panel, Hemoglobin A1c  -  Stable, tolerating current regimen. Medications refilled. Labs pending as above.   Essential hypertension, benign - Plan: Comprehensive metabolic panel, amLODipine (NORVASC) 5 MG tablet, losartan (COZAAR) 100 MG tablet  -Borderline but stable, continue same regimen, labs pending.  Commended on weight loss  Hyperlipidemia, unspecified hyperlipidemia type - Plan: atorvastatin (LIPITOR) 10 MG tablet  -Tolerating Lipitor, good control in February, continue same regimen.  Controlled type 2 diabetes mellitus without complication, without long-term current use of insulin ([place]) - Plan: metFORMIN (GLUCOPHAGE) 500 MG tablet  -As above, continue on the same dose.  Gout controlled, has as needed medications as needed.  Meds ordered this encounter  Medications  . amLODipine (NORVASC) 5 MG tablet    Sig: TAKE 1 TABLET DAILY    Dispense:  90 tablet    Refill:  2  . atorvastatin (LIPITOR) 10 MG tablet    Sig: Take 1 tablet (10 mg total) by mouth daily.    Dispense:  90 tablet    Refill:  2  . losartan (COZAAR) 100 MG tablet    Sig: Take 1 tablet (100 mg total) by mouth daily.    Dispense:  90 tablet    Refill:  2  . metFORMIN (GLUCOPHAGE) 500 MG tablet    Sig: Take 1 tablet (500 mg total) by mouth daily with breakfast.    Dispense:  90 tablet    Refill:  2   Patient Instructions    No med changes.  Keep up the good work with watching diet as weight has improved since last visit.  Follow-up in 6 months, but let me know if there are questions in the meantime.   If you have lab work done today you will be  contacted with your lab results within the next 2 weeks.  If you have not heard from [place] then please contact us. The fastest way to get your results is to register for My Chart.   IF you received an x-ray today, you will receive an invoice from [facility]. Please contact [facility] at [phone] with questions or concerns regarding your  invoice.   IF you received labwork today, you will receive an invoice from [facility]. Please contact [facility] at [phone] with questions or concerns regarding your invoice.   Our billing staff will not be able to assist you with questions regarding bills from these companies.  You will be contacted with the lab results as soon as they are available. The fastest way to get your results is to activate your My Chart account. Instructions are located on the last page of this paperwork. If you have not heard from [place] regarding the results in 2 weeks, please contact this office.      Signed,   Merri Ray, MD [facility].  12/08/18 9:43 AM

## 2018-12-08 NOTE — Patient Instructions (Addendum)
  No med changes.  Keep up the good work with watching diet as weight has improved since last visit.  Follow-up in 6 months, but let me know if there are questions in the meantime.   If you have lab work done today you will be contacted with your lab results within the next 2 weeks.  If you have not heard from [place] then please contact us. The fastest way to get your results is to register for My Chart.   IF you received an x-ray today, you will receive an invoice from [facility]. Please contact [facility] at [phone] with questions or concerns regarding your invoice.   IF you received labwork today, you will receive an invoice from [facility]. Please contact [facility] at [phone] with questions or concerns regarding your invoice.   Our billing staff will not be able to assist you with questions regarding bills from these companies.  You will be contacted with the lab results as soon as they are available. The fastest way to get your results is to activate your My Chart account. Instructions are located on the last page of this paperwork. If you have not heard from [place] regarding the results in 2 weeks, please contact this office.

## 2018-12-09 LAB — COMPREHENSIVE METABOLIC PANEL
ALT: 21 IU/L (ref 0–44)
AST: 19 IU/L (ref 0–40)
Albumin/Globulin Ratio: 2 (ref 1.2–2.2)
Albumin: 4.7 g/dL (ref 3.7–4.7)
Alkaline Phosphatase: 47 IU/L (ref 39–117)
BUN/Creatinine Ratio: 23 (ref 10–24)
BUN: 22 mg/dL (ref 8–27)
Bilirubin Total: 0.6 mg/dL (ref 0.0–1.2)
CO2: 23 mmol/L (ref 20–29)
Calcium: 9.1 mg/dL (ref 8.6–10.2)
Chloride: 105 mmol/L (ref 96–106)
Creatinine, Ser: 0.95 mg/dL (ref 0.76–1.27)
GFR calc Af Amer: 92 mL/min/{1.73_m2} (ref >59)
GFR calc non Af Amer: 80 mL/min/{1.73_m2} (ref >59)
Globulin, Total: 2.3 g/dL (ref 1.5–4.5)
Glucose: 116 mg/dL — ABNORMAL HIGH (ref 65–99)
Potassium: 4.5 mmol/L (ref 3.5–5.2)
Sodium: 144 mmol/L (ref 134–144)
Total Protein: 7 g/dL (ref 6.0–8.5)

## 2018-12-09 LAB — HEMOGLOBIN A1C
Est. average glucose Bld gHb Est-mCnc: 126 mg/dL
Hgb A1c MFr Bld: 6 % — ABNORMAL HIGH (ref 4.8–5.6)

## 2018-12-20 ENCOUNTER — Encounter: Payer: Self-pay | Admitting: Radiology

## 2019-01-03 ENCOUNTER — Ambulatory Visit: Payer: Medicare HMO | Admitting: Family Medicine

## 2019-01-04 DIAGNOSIS — H401131 Primary open-angle glaucoma, bilateral, mild stage: Secondary | ICD-10-CM | POA: Diagnosis not present

## 2019-01-17 ENCOUNTER — Ambulatory Visit: Payer: Medicare HMO | Admitting: Orthopaedic Surgery

## 2019-03-03 ENCOUNTER — Encounter (INDEPENDENT_AMBULATORY_CARE_PROVIDER_SITE_OTHER): Payer: Medicare HMO | Admitting: Ophthalmology

## 2019-03-06 DIAGNOSIS — Z20828 Contact with and (suspected) exposure to other viral communicable diseases: Secondary | ICD-10-CM | POA: Diagnosis not present

## 2019-05-09 DIAGNOSIS — H401131 Primary open-angle glaucoma, bilateral, mild stage: Secondary | ICD-10-CM | POA: Diagnosis not present

## 2019-05-09 DIAGNOSIS — E119 Type 2 diabetes mellitus without complications: Secondary | ICD-10-CM | POA: Diagnosis not present

## 2019-05-10 DIAGNOSIS — Z0100 Encounter for examination of eyes and vision without abnormal findings: Secondary | ICD-10-CM | POA: Diagnosis not present

## 2019-05-16 ENCOUNTER — Telehealth: Payer: Self-pay | Admitting: Family Medicine

## 2019-06-09 ENCOUNTER — Ambulatory Visit: Payer: Medicare HMO | Admitting: Family Medicine

## 2019-06-19 ENCOUNTER — Ambulatory Visit (INDEPENDENT_AMBULATORY_CARE_PROVIDER_SITE_OTHER): Payer: Medicare HMO | Admitting: Family Medicine

## 2019-06-19 ENCOUNTER — Encounter: Payer: Self-pay | Admitting: Family Medicine

## 2019-06-19 ENCOUNTER — Other Ambulatory Visit: Payer: Self-pay

## 2019-06-19 VITALS — BP 128/76 | HR 71 | Temp 98.3°F | Ht 72.0 in | Wt 251.0 lb

## 2019-06-19 DIAGNOSIS — M7582 Other shoulder lesions, left shoulder: Secondary | ICD-10-CM

## 2019-06-19 DIAGNOSIS — E119 Type 2 diabetes mellitus without complications: Secondary | ICD-10-CM

## 2019-06-19 DIAGNOSIS — I10 Essential (primary) hypertension: Secondary | ICD-10-CM

## 2019-06-19 DIAGNOSIS — M25512 Pain in left shoulder: Secondary | ICD-10-CM | POA: Diagnosis not present

## 2019-06-19 DIAGNOSIS — M75122 Complete rotator cuff tear or rupture of left shoulder, not specified as traumatic: Secondary | ICD-10-CM | POA: Diagnosis not present

## 2019-06-19 DIAGNOSIS — E669 Obesity, unspecified: Secondary | ICD-10-CM

## 2019-06-19 DIAGNOSIS — M25511 Pain in right shoulder: Secondary | ICD-10-CM

## 2019-06-19 DIAGNOSIS — E66811 Obesity, class 1: Secondary | ICD-10-CM

## 2019-06-19 DIAGNOSIS — Z6834 Body mass index (BMI) 34.0-34.9, adult: Secondary | ICD-10-CM

## 2019-06-19 DIAGNOSIS — E785 Hyperlipidemia, unspecified: Secondary | ICD-10-CM | POA: Diagnosis not present

## 2019-06-19 MED ORDER — LOSARTAN POTASSIUM 100 MG PO TABS: 100.0000 mg | ORAL_TABLET | Freq: Every day | ORAL | 2 refills | Status: DC

## 2019-06-19 MED ORDER — AMLODIPINE BESYLATE 5 MG PO TABS: ORAL_TABLET | ORAL | 2 refills | Status: DC

## 2019-06-19 MED ORDER — ATORVASTATIN CALCIUM 10 MG PO TABS: 10.0000 mg | ORAL_TABLET | Freq: Every day | ORAL | 2 refills | Status: DC

## 2019-06-19 MED ORDER — METFORMIN HCL 500 MG PO TABS: 500.0000 mg | ORAL_TABLET | Freq: Every day | ORAL | 2 refills | Status: DC

## 2019-06-19 NOTE — Progress Notes (Signed)
Subjective:  Patient ID: Jeffery George, male    DOB: 1945-10-23  Age: 74 y.o. MRN: [id number]  CC:  Chief Complaint  Patient presents with  . Follow-up    Followen up on type 2 diabetes, hypertension, and hyperlipidemia. pt hasn't had any issues with his BS since last vist diabetes seems well controled. pt checks his BS daily as long as he remembers. pt hasn't had any issues with his BP since last visit. pt hasn't had any physical symptoms of high BP. pt checks his BP once a month.      HPI YAHEL FUSTON presents for    Diabetes: Type II off insulin without known complications.  He does have glaucoma, followed by ophthalmology, saw few weeks ago.  Currently managed with Metformin 500 mg daily, he is on statin with Lipitor 10 mg daily, ARB with losartan 100 mg daily. No new side effects.  Home readings: highest 163 last week.  Down below 100 at times - low of 98.  No symptomatic lows.  Exercise - off for awhile with weather, quarantine. Microalbumin: Normal ratio 06/27/2018 Optho, foot exam, pneumovax: Up-to-date  Will get on Covid vaccine list.   Lab Results  Component Value Date   HGBA1C 6.0 (H) 12/08/2018   HGBA1C 6.1 (H) 06/27/2018   HGBA1C 6.5 (H) 12/23/2017   Lab Results  Component Value Date   MICROALBUR 0.6 12/10/2014   LDLCALC 55 06/27/2018   CREATININE 0.95 12/08/2018   Wt Readings from Last 3 Encounters:  06/19/19 251 lb (113.9 kg)  12/08/18 250 lb 9.6 oz (113.7 kg)  11/15/18 255 lb (115.7 kg)    Hypertension: Previously borderline, weight was improving at August visit.  Continue amlodipine 5 mg, losartan 100 mg daily. Less exercise since last visit. No missed dose.  Home readings: at drug store few times - unknown readings.  BP Readings from Last 3 Encounters:  06/19/19 (!) 179/87  12/08/18 140/70  06/27/18 140/80   Lab Results  Component Value Date   CREATININE 0.95 12/08/2018    Hyperlipidemia: Lipitor 10 mg daily.  LDL at goal in March of last  year. No new side effects.  Lab Results  Component Value Date   CHOL 125 06/27/2018   HDL 44 06/27/2018   LDLCALC 55 06/27/2018   TRIG 132 06/27/2018   CHOLHDL 2.8 06/27/2018   Lab Results  Component Value Date   ALT 21 12/08/2018   AST 19 12/08/2018   ALKPHOS 47 12/08/2018   BILITOT 0.6 12/08/2018   Bilateral shoulder pain Previously treated by ortho Dr. Erlinda Hong for left shoulder pain. Injected, felt better for few weeks only. PT prescribed, but not authorized. Note reviewed: "Left: large full-thickness retracted tear of the supraspinatus with atrophy.  He also has severe tendinosis of the subscapularis tendon and bicipital tenosynovitis.  Given these findings my impression is that he suffered an acute on chronic injury to his rotator cuff." Last subacromial injection in April 2020.  R shoulder sore now for past few months. Occasional alleve - only temporary relief.  Pain keeps up at night. Left worse than right. 1-2 hours of sleep at a time. Better in recliner  Would like to try injection again today and follow up with ortho.   History Patient Active Problem List   Diagnosis Date Noted  . Rotator cuff syndrome, left 11/15/2018  . Chronic left shoulder pain 08/09/2018  . Left retinal detachment 08/11/2017  . Gout 02/27/2013  . Essential hypertension, benign 02/27/2013  .  Type II or unspecified type diabetes mellitus without mention of complication, uncontrolled 02/27/2013   Past Medical History:  Diagnosis Date  . Arthritis   . Diabetes mellitus without complication ([place])   . Heart murmur   . Hypertension    Past Surgical History:  Procedure Laterality Date  . CATARACT EXTRACTION    . FRACTURE SURGERY     Lt ankle  . GAS/FLUID EXCHANGE Left 08/12/2017   Procedure: C3F8 GAS/FLUID EXCHANGE;  Surgeon: Bernarda Caffey, MD;  Location: [place];  Service: Ophthalmology;  Laterality: Left;  . HERNIA REPAIR    . IRIDOTOMY / IRIDECTOMY Bilateral    stents  . VASECTOMY    . VITRECTOMY  25 GAUGE WITH SCLERAL BUCKLE Left 08/12/2017   Procedure: VITRECTOMY 25 GAUGE WITH SCLERAL BUCKLE WITH ENDOLASER;  Surgeon: Bernarda Caffey, MD;  Location: [place];  Service: Ophthalmology;  Laterality: Left;   No Known Allergies Prior to Admission medications   Medication Sig Start Date End Date Taking? Authorizing Provider  ACCU-CHEK SOFTCLIX LANCETS lancets Test blood sugar once daily. Dx E11.9 08/26/15  Yes [name], MD  amLODipine (NORVASC) 5 MG tablet TAKE 1 TABLET DAILY 12/08/18  Yes [name], MD  aspirin EC 81 MG tablet Take 1 tablet (81 mg total) by mouth daily. 02/27/13  Yes Shawnee Knapp, MD  atorvastatin (LIPITOR) 10 MG tablet Take 1 tablet (10 mg total) by mouth daily. 12/08/18  Yes [name], MD  blood glucose meter kit and supplies TrueMetrix, TruTrack, Accu-Chek Aviva Expert, Accu-Chek Aviva Plus, Accu-Chek Guide, Accu-Chek Nano, or Accu-Chek Smartview.   Use once per day. 07/31/17  Yes [name], MD  Blood Glucose Monitoring Suppl (BLOOD GLUCOSE MONITOR KIT) KIT Use to test blood sugar twice daily. 07/17/13  Yes [name], MD  colchicine (COLCRYS) 0.6 MG tablet Take 2 tabs po at symptom onset. Repeat 1 tab po 1 hour later. 06/22/17  Yes [name], MD  indomethacin (INDOCIN) 50 MG capsule TAKE 1 CAPSULE THREE TIMES A DAY AS NEEDED FOR MODERATE PAIN 06/22/17  Yes [name], MD  Lancets [facility] ULTRASOFT) lancets Use as instructed 06/05/13  Yes [name], MD  latanoprost (XALATAN) 0.005 % ophthalmic solution Place 1 drop into both eyes every evening.  07/23/17  Yes [provider]  losartan (COZAAR) 100 MG tablet Take 1 tablet (100 mg total) by mouth daily. 12/08/18  Yes [name], MD  metFORMIN (GLUCOPHAGE) 500 MG tablet Take 1 tablet (500 mg total) by mouth daily with breakfast. 12/08/18  Yes [name], MD  Omega-3 Fatty Acids (FISH OIL) 1000 MG CAPS Take by mouth daily.    Yes [provider]  TRUE  METRIX BLOOD GLUCOSE TEST test strip TEST ONE TIME DAILY 03/11/18  Yes [name], MD  TRUEPLUS LANCETS 33G MISC TEST ONE TIME DAILY 03/11/18  Yes [name], MD   Social History   Socioeconomic History  . Marital status: Married    Spouse name: Not on file  . Number of children: Not on file  . Years of education: Not on file  . Highest education level: Not on file  Occupational History  . Occupation: Educational psychologist  Tobacco Use  . Smoking status: Former Research scientist (life sciences)  . Smokeless tobacco: Never Used  Substance and Sexual Activity  . Alcohol use: No    Alcohol/week: 0.0 standard drinks  . Drug use: No  . Sexual activity: Never  Other Topics  Concern  . Not on file  Social History Narrative   Married   Counsellor at [employer] Strain:   . Difficulty of Paying Living Expenses: Not on file  Food Insecurity:   . Worried About Charity fundraiser in the Last Year: Not on file  . Ran Out of Food in the Last Year: Not on file  Transportation Needs:   . Lack of Transportation (Medical): Not on file  . Lack of Transportation (Non-Medical): Not on file  Physical Activity:   . Days of Exercise per Week: Not on file  . Minutes of Exercise per Session: Not on file  Stress:   . Feeling of Stress : Not on file  Social Connections:   . Frequency of Communication with Friends and Family: Not on file  . Frequency of Social Gatherings with Friends and Family: Not on file  . Attends Religious Services: Not on file  . Active Member of Clubs or Organizations: Not on file  . Attends Archivist Meetings: Not on file  . Marital Status: Not on file  Intimate Partner Violence:   . Fear of Current or Ex-Partner: Not on file  . Emotionally Abused: Not on file  . Physically Abused: Not on file  . Sexually Abused: Not on file    Review of Systems  Constitutional: Negative for fatigue and unexpected weight  change.  Eyes: Negative for visual disturbance (no new changes. ).  Respiratory: Negative for cough, chest tightness and shortness of breath.   Cardiovascular: Negative for chest pain, palpitations and leg swelling.  Gastrointestinal: Negative for abdominal pain and blood in stool.  Musculoskeletal: Positive for arthralgias (l>R shoulder. ).  Neurological: Negative for dizziness, light-headedness and headaches.     Objective:   Vitals:   06/19/19 1009 06/19/19 1123  BP: (!) 179/87 128/76  Pulse: 71   Temp: 98.3 F (36.8 C)   TempSrc: Temporal   SpO2: 95%   Weight: 251 lb (113.9 kg)   Height: 6' (1.829 m)      Physical Exam Vitals reviewed.  Constitutional:      Appearance: He is well-developed.  HENT:     Head: Normocephalic and atraumatic.  Eyes:     Pupils: Pupils are equal, round, and reactive to light.  Neck:     Vascular: No carotid bruit or JVD.  Cardiovascular:     Rate and Rhythm: Normal rate and regular rhythm.     Heart sounds: Normal heart sounds. No murmur.  Pulmonary:     Effort: Pulmonary effort is normal.     Breath sounds: Normal breath sounds. No rales.  Musculoskeletal:     Right shoulder: No swelling, deformity, tenderness or bony tenderness. Decreased range of motion (passive 150 abd, flex. active 120 both. ). Normal strength (pain with empty can, neer. neg hawkins. ). Normal pulse.     Left shoulder: No swelling, deformity, tenderness or bony tenderness. Decreased range of motion (90 active flex/abdn. 100-120 passive. ). Decreased strength (pain, slight gaurd with empty can. pain with neer and hawkins. ). Normal pulse.     Cervical back: No pain with movement (pain free ROM and not causing shoulder pain. ).  Skin:    General: Skin is warm and dry.  Neurological:     Mental Status: He is alert and oriented to person, place, and time.    Mri report of left shoulder 11/07/18 reviewed.  Assessment & Plan:  ENGELBERT SEVIN is a 74 y.o. male  . Class 1 obesity with serious comorbidity and body mass index (BMI) of 34.0 to 34.9 in adult, unspecified obesity type Controlled type 2 diabetes mellitus without complication, without long-term current use of insulin (HCC) - Plan: Hemoglobin A1c, metFORMIN (GLUCOPHAGE) 500 MG tablet, DISCONTINUED: metFORMIN (GLUCOPHAGE) 500 MG tablet  -Controlled at last check, tolerating current regimen.  Check A1c.  Essential hypertension, benign - Plan: Comprehensive metabolic panel, amLODipine (NORVASC) 5 MG tablet, losartan (COZAAR) 100 MG tablet, DISCONTINUED: amLODipine (NORVASC) 5 MG tablet, DISCONTINUED: losartan (COZAAR) 100 MG tablet  -Stable on recheck.  Hyperlipidemia, unspecified hyperlipidemia type - Plan: Comprehensive metabolic panel, Lipid panel, atorvastatin (LIPITOR) 10 MG tablet, DISCONTINUED: atorvastatin (LIPITOR) 10 MG tablet  -Tolerating Lipitor, continue same, check labs.  Bilateral shoulder pain, unspecified chronicity Complete tear of left rotator cuff, unspecified whether traumatic Rotator cuff tendinitis, left  -Bilateral shoulder pain left greater than right with history of rotator cuff tear as above.  Suspect some chronic rotator cuff tendinosis component as well.  treatment options discussed.  He did want to try a repeat injection.  Unfortunately did not have Kenalog available.  Plan to return later this week for procedure only visit.  Follow-up with orthopedics also discussed if not improving after that injection.  No orders of the defined types were placed in this encounter.  Patient Instructions    No med changes for now. We will let you know about the injection later today - can reschedule to later this week if needed.    If you have lab work done today you will be contacted with your lab results within the next 2 weeks.  If you have not heard from [place] then please contact us. The fastest way to get your results is to register for My Chart.   IF you received an x-ray  today, you will receive an invoice from [facility]. Please contact [facility] at [phone] with questions or concerns regarding your invoice.   IF you received labwork today, you will receive an invoice from [facility]. Please contact [facility] at [phone] with questions or concerns regarding your invoice.   Our billing staff will not be able to assist you with questions regarding bills from these companies.  You will be contacted with the lab results as soon as they are available. The fastest way to get your results is to activate your My Chart account. Instructions are located on the last page of this paperwork. If you have not heard from [place] regarding the results in 2 weeks, please contact this office.         Signed, Merri Ray, MD [facility]

## 2019-06-19 NOTE — Patient Instructions (Addendum)
  No med changes for now. We will let you know about the injection later today - can reschedule to later this week if needed.    If you have lab work done today you will be contacted with your lab results within the next 2 weeks.  If you have not heard from [place] then please contact us. The fastest way to get your results is to register for My Chart.   IF you received an x-ray today, you will receive an invoice from [facility]. Please contact [facility] at [phone] with questions or concerns regarding your invoice.   IF you received labwork today, you will receive an invoice from [facility]. Please contact [facility] at [phone] with questions or concerns regarding your invoice.   Our billing staff will not be able to assist you with questions regarding bills from these companies.  You will be contacted with the lab results as soon as they are available. The fastest way to get your results is to activate your My Chart account. Instructions are located on the last page of this paperwork. If you have not heard from [place] regarding the results in 2 weeks, please contact this office.

## 2019-06-20 ENCOUNTER — Encounter: Payer: Self-pay | Admitting: Family Medicine

## 2019-06-20 LAB — COMPREHENSIVE METABOLIC PANEL
ALT: 23 IU/L (ref 0–44)
AST: 15 IU/L (ref 0–40)
Albumin/Globulin Ratio: 2 (ref 1.2–2.2)
Albumin: 4.7 g/dL (ref 3.7–4.7)
Alkaline Phosphatase: 50 IU/L (ref 39–117)
BUN/Creatinine Ratio: 20 (ref 10–24)
BUN: 19 mg/dL (ref 8–27)
Bilirubin Total: 0.6 mg/dL (ref 0.0–1.2)
CO2: 22 mmol/L (ref 20–29)
Calcium: 9.4 mg/dL (ref 8.6–10.2)
Chloride: 104 mmol/L (ref 96–106)
Creatinine, Ser: 0.95 mg/dL (ref 0.76–1.27)
GFR calc Af Amer: 92 mL/min/{1.73_m2} (ref >59)
GFR calc non Af Amer: 80 mL/min/{1.73_m2} (ref >59)
Globulin, Total: 2.3 g/dL (ref 1.5–4.5)
Glucose: 113 mg/dL — ABNORMAL HIGH (ref 65–99)
Potassium: 4 mmol/L (ref 3.5–5.2)
Sodium: 142 mmol/L (ref 134–144)
Total Protein: 7 g/dL (ref 6.0–8.5)

## 2019-06-20 LAB — LIPID PANEL
Chol/HDL Ratio: 2.3 ratio (ref 0.0–5.0)
Cholesterol, Total: 108 mg/dL (ref 100–199)
HDL: 47 mg/dL (ref >39)
LDL Chol Calc (NIH): 44 mg/dL (ref 0–99)
Triglycerides: 90 mg/dL (ref 0–149)
VLDL Cholesterol Cal: 17 mg/dL (ref 5–40)

## 2019-06-20 LAB — HEMOGLOBIN A1C
Est. average glucose Bld gHb Est-mCnc: 126 mg/dL
Hgb A1c MFr Bld: 6 % — ABNORMAL HIGH (ref 4.8–5.6)

## 2019-11-09 DIAGNOSIS — H401131 Primary open-angle glaucoma, bilateral, mild stage: Secondary | ICD-10-CM | POA: Diagnosis not present

## 2019-12-20 ENCOUNTER — Encounter: Payer: Self-pay | Admitting: Family Medicine

## 2019-12-20 ENCOUNTER — Ambulatory Visit (INDEPENDENT_AMBULATORY_CARE_PROVIDER_SITE_OTHER): Payer: Medicare HMO | Admitting: Family Medicine

## 2019-12-20 ENCOUNTER — Other Ambulatory Visit: Payer: Self-pay

## 2019-12-20 VITALS — BP 160/84 | HR 68 | Temp 98.5°F | Ht 72.0 in | Wt 244.0 lb

## 2019-12-20 DIAGNOSIS — E785 Hyperlipidemia, unspecified: Secondary | ICD-10-CM

## 2019-12-20 DIAGNOSIS — I10 Essential (primary) hypertension: Secondary | ICD-10-CM

## 2019-12-20 DIAGNOSIS — Z7189 Other specified counseling: Secondary | ICD-10-CM | POA: Diagnosis not present

## 2019-12-20 DIAGNOSIS — E1169 Type 2 diabetes mellitus with other specified complication: Secondary | ICD-10-CM | POA: Diagnosis not present

## 2019-12-20 DIAGNOSIS — Z7185 Encounter for immunization safety counseling: Secondary | ICD-10-CM

## 2019-12-20 DIAGNOSIS — E669 Obesity, unspecified: Secondary | ICD-10-CM

## 2019-12-20 DIAGNOSIS — Z23 Encounter for immunization: Secondary | ICD-10-CM

## 2019-12-20 NOTE — Patient Instructions (Addendum)
Here is a link to information from the CDC on the Covid vaccine.  Please let me know if you have any questions on anything you have read on that site or other further questions regarding that vaccine.  I do recommend you get vaccinated against COVID-19 as soon as possible as you do have some increased risk of complications with that infection. Here is a link to information about the COVID-19 vaccine: [email or web address]  Area on the foot may be a plantar wart.  Please follow-up in the next 3 to 4 weeks and we can evaluate that area further and possible treatment in office at that time  Blood pressure higher than usual. Keep a record of your blood pressures outside of the office and bring them to the next office visit.    How to Take Your Blood Pressure You can take your blood pressure at home with a machine. You may need to check your blood pressure at home:  To check if you have high blood pressure (hypertension).  To check your blood pressure over time.  To make sure your blood pressure medicine is working. Supplies needed: You will need a blood pressure machine, or monitor. You can buy one at a drugstore or online. When choosing one:  Choose one with an arm cuff.  Choose one that wraps around your upper arm. Only one finger should fit between your arm and the cuff.  Do not choose one that measures your blood pressure from your wrist or finger. Your doctor can suggest a monitor. How to prepare Avoid these things for 30 minutes before checking your blood pressure:  Drinking caffeine.  Drinking alcohol.  Eating.  Smoking.  Exercising. Five minutes before checking your blood pressure:  Pee.  Sit in a dining chair. Avoid sitting in a soft couch or armchair.  Be quiet. Do not talk. How to take your blood pressure Follow the instructions that came with your machine. If you have a digital  blood pressure monitor, these may be the instructions: 1. Sit up straight. 2. Place your feet on the floor. Do not cross your ankles or legs. 3. Rest your left arm at the level of your heart. You may rest it on a table, desk, or chair. 4. Pull up your shirt sleeve. 5. Wrap the blood pressure cuff around the upper part of your left arm. The cuff should be 1 inch (2.5 cm) above your elbow. It is best to wrap the cuff around bare skin. 6. Fit the cuff snugly around your arm. You should be able to place only one finger between the cuff and your arm. 7. Put the cord inside the groove of your elbow. 8. Press the power button. 9. Sit quietly while the cuff fills with air and loses air. 10. Write down the numbers on the screen. 11. Wait 2-3 minutes and then repeat steps 1-10. What do the numbers mean? Two numbers make up your blood pressure. The first number is called systolic pressure. The second is called diastolic pressure. An example of a blood pressure reading is "120 over 80" (or 120/80). If you are an adult and do not have a medical condition, use this guide to find out if your blood pressure is normal: Normal  First number: below 120.  Second number: below 80. Elevated  First number: 120-129.  Second number: below 80. Hypertension stage 1  First number: 130-139.  Second number: 80-89. Hypertension stage 2  First number: 140 or above.  Second number:  90 or above. Your blood pressure is above normal even if only the top or bottom number is above normal. Follow these instructions at home:  Check your blood pressure as often as your doctor tells you to.  Take your monitor to your next doctor's appointment. Your doctor will: ? Make sure you are using it correctly. ? Make sure it is working right.  Make sure you understand what your blood pressure numbers should be.  Tell your doctor if your medicines are causing side effects. Contact a doctor if:  Your blood pressure keeps  being high. Get help right away if:  Your first blood pressure number is higher than 180.  Your second blood pressure number is higher than 120. This information is not intended to replace advice given to you by your health care provider. Make sure you discuss any questions you have with your health care provider. Document Revised: 03/12/2017 Document Reviewed: 09/06/2015 Elsevier Patient Education  [place].   Type 2 Diabetes Mellitus, Self Care, Adult Caring for yourself after you have been diagnosed with type 2 diabetes (type 2 diabetes mellitus) means keeping your blood sugar (glucose) under control with a balance of:  Nutrition.  Exercise.  Lifestyle changes.  Medicines or insulin, if necessary.  Support from your team of health care providers and others. The following information explains what you need to know to manage your diabetes at home. What are the risks? Having diabetes can put you at risk for other long-term (chronic) conditions, such as heart disease and kidney disease. Your health care provider may prescribe medicines to help prevent complications from diabetes. These medicines may include:  Aspirin.  Medicine to lower cholesterol.  Medicine to control blood pressure. How to monitor blood glucose   Check your blood glucose every day, as often as told by your health care provider.  Have your A1c (hemoglobin A1c) level checked two or more times a year, or as often as told by your health care provider. Your health care provider will set individualized treatment goals for you. Generally, the goal of treatment is to maintain the following blood glucose levels:  Before meals (preprandial): 80-130 mg/dL (4.4-7.2 mmol/L).  After meals (postprandial): below 180 mg/dL (10 mmol/L).  A1c level: less than 7%. How to manage hyperglycemia and hypoglycemia Hyperglycemia symptoms Hyperglycemia, also called high blood glucose, occurs when blood glucose is too  high. Make sure you know the early signs of hyperglycemia, such as:  Increased thirst.  Hunger.  Feeling very tired.  Needing to urinate more often than usual.  Blurry vision. Hypoglycemia symptoms Hypoglycemia, also called low blood glucose, occurswith a blood glucose level at or below 70 mg/dL (3.9 mmol/L). The risk for hypoglycemia increases during or after exercise, during sleep, during illness, and when skipping meals or not eating for a long time (fasting). It is important to know the symptoms of hypoglycemia and treat it right away. Always have a 15-gram rapid-acting carbohydrate snack with you to treat low blood glucose. Family members and close friends should also know the symptoms and should understand how to treat hypoglycemia, in case you are not able to treat yourself. Symptoms may include:  Hunger.  Anxiety.  Sweating and feeling clammy.  Confusion.  Dizziness or feeling light-headed.  Sleepiness.  Nausea.  Increased heart rate.  Headache.  Blurry vision.  Irritability.  A change in coordination.  Tingling or numbness around the mouth, lips, or tongue.  Restless sleep.  Fainting.  Seizure. Treating hypoglycemia If  you are alert and able to swallow safely, follow the 15:15 rule:  Take 15 grams of a rapid-acting carbohydrate. Talk with your health care provider about how much you should take.  Rapid-acting options include: ? Glucose pills (take 15 grams). ? 6-8 pieces of hard candy. ? 4-6 oz (120-150 mL) of fruit juice. ? 4-6 oz (120-150 mL) of regular (not diet) soda. ? 1 Tbsp (15 mL) honey or sugar.  Check your blood glucose 15 minutes after you take the carbohydrate.  If the repeat blood glucose level is still at or below 70 mg/dL (3.9 mmol/L), take 15 grams of a carbohydrate again.  If your blood glucose level does not increase above 70 mg/dL (3.9 mmol/L) after 3 tries, seek emergency medical care.  After your blood glucose level  returns to normal, eat a meal or a snack within 1 hour. Treating severe hypoglycemia Severe hypoglycemia is when your blood glucose level is at or below 54 mg/dL (3 mmol/L). Severe hypoglycemia is an emergency. Do not wait to see if the symptoms will go away. Get medical help right away. Call your local emergency services (911 in the [place] If you have severe hypoglycemia and you cannot eat or drink, you may need an injection of glucagon. A family member or close friend should learn how to check your blood glucose and how to give you a glucagon injection. Ask your health care provider if you need to have an emergency glucagon injection kit available. Severe hypoglycemia may need to be treated in a hospital. The treatment may include getting glucose through an IV. You may also need treatment for the cause of your hypoglycemia. Follow these instructions at home: Take diabetes medicines as told  If your health care provider prescribed insulin or diabetes medicines, take them every day.  Do not run out of insulin or other diabetes medicines that you take. Plan ahead so you always have these available.  If you use insulin, adjust your dosage based on how physically active you are and what foods you eat. Your health care provider will tell you how to adjust your dosage. Make healthy food choices  The things that you eat and drink affect your blood glucose and your insulin dosage. Making good choices helps to control your diabetes and prevent other health problems. A healthy meal plan includes eating lean proteins, complex carbohydrates, fresh fruits and vegetables, low-fat dairy products, and healthy fats. Make an appointment to see a diet and nutrition specialist (registered dietitian) to help you create an eating plan that is right for you. Make sure that you:  Follow instructions from your health care provider about eating or drinking restrictions.  Drink enough fluid to keep your urine pale  yellow.  Keep a record of the carbohydrates that you eat. Do this by reading food labels and learning the standard serving sizes of foods.  Follow your sick day plan whenever you cannot eat or drink as usual. Make this plan in advance with your health care provider.  Stay active Exercise regularly, as told by your health care provider. This may include:  Stretching and doing strength exercises, such as yoga or weightlifting, 2 or more times a week.  Doing 150 minutes or more of moderate-intensity or vigorous-intensity exercise each week. This could be brisk walking, biking, or water aerobics. ? Spread out your activity over 3 or more days of the week. ? Do not go more than 2 days in a row without doing some kind of physical  activity. When you start a new exercise or activity, work with your health care provider to adjust your insulin, medicines, or food intake as needed. Make healthy lifestyle choices  Do not use any tobacco products, such as cigarettes, chewing tobacco, and e-cigarettes. If you need help quitting, ask your health care provider.  If your health care provider says that alcohol is safe for you, limit alcohol intake to no more than 1 drink per day for nonpregnant women and 2 drinks per day for men. One drink equals 12 oz of beer (355 mL), 5 oz of wine (148 mL), or 1 oz of hard liquor (44 mL).  Learn to manage stress. If you need help with this, ask your health care provider. Care for your body   Keep your immunizations up to date. In addition to getting vaccinations as told by your health care provider, it is recommended that you get vaccinated against the following illnesses: ? The flu (influenza). Get a flu shot every year. ? Pneumonia. ? Hepatitis B.  Schedule an eye exam soon after your diagnosis, and then one time every year after that.  Check your skin and feet every day for cuts, bruises, redness, blisters, or sores. Schedule a foot exam with your health care  provider once every year.  Brush your teeth and gums two times a day, and floss one or more times a day. Visit your dentist one or more times every 6 months.  Maintain a healthy weight. General instructions  Take over-the-counter and prescription medicines only as told by your health care provider.  Share your diabetes management plan with people in your workplace, school, and household.  Carry a medical alert card or wear medical alert jewelry.  Keep all follow-up visits as told by your health care provider. This is important. Questions to ask your health care provider  Do I need to meet with a diabetes educator?  Where can I find a support group for people with diabetes? Where to find more information For more information about diabetes, visit:  American Diabetes Association (ADA): www.diabetes.org  American Association of Diabetes Educators (AADE): www.diabeteseducator.org Summary  Caring for yourself after you have been diagnosed with (type 2 diabetes mellitus) means keeping your blood sugar (glucose) under control with a balance of nutrition, exercise, lifestyle changes, and medicine.  Check your blood glucose every day, as often as told by your health care provider.  Having diabetes can put you at risk for other long-term (chronic) conditions, such as heart disease and kidney disease. Your health care provider may prescribe medicines to help prevent complications from diabetes.  Keep all follow-up visits as told by your health care provider. This is important. This information is not intended to replace advice given to you by your health care provider. Make sure you discuss any questions you have with your health care provider. Document Revised: 09/20/2017 Document Reviewed: 05/03/2015 Elsevier Patient Education  [employer].    If you have lab work done today you will be contacted with your lab results within the next 2 weeks.  If you have not heard from [place] then  please contact us. The fastest way to get your results is to register for My Chart.   IF you received an x-ray today, you will receive an invoice from [facility]. Please contact [facility] at [phone] with questions or concerns regarding your invoice.   IF you received labwork today, you will receive an invoice from [facility]. Please contact [facility] at [phone]  with questions or concerns regarding your invoice.   Our billing staff will not be able to assist you with questions regarding bills from these companies.  You will be contacted with the lab results as soon as they are available. The fastest way to get your results is to activate your My Chart account. Instructions are located on the last page of this paperwork. If you have not heard from [place] regarding the results in 2 weeks, please contact this office.

## 2019-12-20 NOTE — Progress Notes (Signed)
Subjective:  Patient ID: Jeffery George, male    DOB: [birth year]  Age: 74 y.o. MRN: [id number]  CC:  Chief Complaint  Patient presents with  . Follow-up    on hypertension, hyperlipidemia, and diabetes. Pt reports no issues with BS since last OV states it's been running normal and pt has been following providers advise.pt repots no issues with BP since last OV. Pt reports no physical symptoms of hyptertension/hyperlipidemia ,    Jeffery George presents for   Hypertension: Amlodipine 5 mg daily, losartan 100 mg daily. No missed doses.  Home readings: none.  No new med side effects.   BP Readings from Last 3 Encounters:  12/20/19 (!) 160/84  06/19/19 128/76  12/08/18 140/70   Lab Results  Component Value Date   CREATININE 0.95 06/19/2019    Hyperlipidemia: Lipitor 10 mg daily. No new myalgias. No new side effects.  Exercise: with working in yard, or physical activity at work.  Lab Results  Component Value Date   CHOL 108 06/19/2019   HDL 47 06/19/2019   LDLCALC 44 06/19/2019   TRIG 90 06/19/2019   CHOLHDL 2.3 06/19/2019   Lab Results  Component Value Date   ALT 23 06/19/2019   AST 15 06/19/2019   ALKPHOS 50 06/19/2019   BILITOT 0.6 06/19/2019   Diabetes: With obesity, hyperlipidemia. Treated with Metformin 500 mg daily.  He has lost weight, down from 251-244 since March. Home readings - intermittently. Fasting 150-153 past few days.  Variable timing of meals.  More active during summer.  Fast food: once per week.  Soda/sweet tea: none.  Microalbumin: Normal ratio 06/27/2018 Optho, foot exam, pneumovax: Up-to-date. optho every 6 months.  Body mass index is 33.09 kg/m. Wt Readings from Last 3 Encounters:  12/20/19 244 lb (110.7 kg)  06/19/19 251 lb (113.9 kg)  12/08/18 250 lb 9.6 oz (113.7 kg)    Lab Results  Component Value Date   HGBA1C 6.0 (H) 06/19/2019   HGBA1C 6.0 (H) 12/08/2018   HGBA1C 6.1 (H) 06/27/2018   Lab Results  Component Value  Date   MICROALBUR 0.6 12/10/2014   LDLCALC 44 06/19/2019   CREATININE 0.95 06/19/2019    Health maintenance: Covid vaccine: has not received, considering.  Influenza vaccine: Will receive today  L foot bump/callous: Thickened area few months. Top area falls off and then thick again.  No new shoes. No new activities. Pads with silicone gel pad. nore with thick only.  Some warts on hands as well. No recent treatments.    History Patient Active Problem List   Diagnosis Date Noted  . Rotator cuff syndrome, left 11/15/2018  . Chronic left shoulder pain 08/09/2018  . Left retinal detachment 08/11/2017  . Gout 02/27/2013  . Essential hypertension, benign 02/27/2013  . Type II or unspecified type diabetes mellitus without mention of complication, uncontrolled 02/27/2013   Past Medical History:  Diagnosis Date  . Arthritis   . Diabetes mellitus without complication ([place])   . Heart murmur   . Hypertension    Past Surgical History:  Procedure Laterality Date  . CATARACT EXTRACTION    . FRACTURE SURGERY     Lt ankle  . GAS/FLUID EXCHANGE Left 08/12/2017   Procedure: C3F8 GAS/FLUID EXCHANGE;  Surgeon: Bernarda Caffey, MD;  Location: [facility];  Service: Ophthalmology;  Laterality: Left;  . HERNIA REPAIR    . IRIDOTOMY / IRIDECTOMY Bilateral    stents  . VASECTOMY    . VITRECTOMY 25 GAUGE  WITH SCLERAL BUCKLE Left 08/12/2017   Procedure: VITRECTOMY 25 GAUGE WITH SCLERAL BUCKLE WITH ENDOLASER;  Surgeon: Bernarda Caffey, MD;  Location: [facility];  Service: Ophthalmology;  Laterality: Left;   No Known Allergies Prior to Admission medications   Medication Sig Start Date End Date Taking? Authorizing Provider  ACCU-CHEK SOFTCLIX LANCETS lancets Test blood sugar once daily. Dx E11.9 08/26/15   [name], MD  amLODipine (NORVASC) 5 MG tablet TAKE 1 TABLET DAILY 06/19/19   [name], MD  aspirin EC 81 MG tablet Take 1 tablet (81 mg total) by mouth daily. 02/27/13   Shawnee Knapp, MD    atorvastatin (LIPITOR) 10 MG tablet Take 1 tablet (10 mg total) by mouth daily. 06/19/19   [name], MD  blood glucose meter kit and supplies TrueMetrix, TruTrack, Accu-Chek Aviva Expert, Accu-Chek Aviva Plus, Accu-Chek Guide, Accu-Chek Nano, or Accu-Chek Smartview.   Use once per day. 07/31/17   [name], MD  Blood Glucose Monitoring Suppl (BLOOD GLUCOSE MONITOR KIT) KIT Use to test blood sugar twice daily. 07/17/13   [name], MD  colchicine (COLCRYS) 0.6 MG tablet Take 2 tabs po at symptom onset. Repeat 1 tab po 1 hour later. 06/22/17   [name], MD  indomethacin (INDOCIN) 50 MG capsule TAKE 1 CAPSULE THREE TIMES A DAY AS NEEDED FOR MODERATE PAIN 06/22/17   [name], MD  Lancets [facility] ULTRASOFT) lancets Use as instructed 06/05/13   [name], MD  latanoprost (XALATAN) 0.005 % ophthalmic solution Place 1 drop into both eyes every evening.  07/23/17   [provider]  losartan (COZAAR) 100 MG tablet Take 1 tablet (100 mg total) by mouth daily. 06/19/19   [name], MD  metFORMIN (GLUCOPHAGE) 500 MG tablet Take 1 tablet (500 mg total) by mouth daily with breakfast. 06/19/19   [name], MD  Omega-3 Fatty Acids (FISH OIL) 1000 MG CAPS Take by mouth daily.     [provider]  TRUE METRIX BLOOD GLUCOSE TEST test strip TEST ONE TIME DAILY 03/11/18   [name], MD  TRUEPLUS LANCETS 33G MISC TEST ONE TIME DAILY 03/11/18   [name], MD   Social History   Socioeconomic History  . Marital status: Married    Spouse name: Not on file  . Number of children: Not on file  . Years of education: Not on file  . Highest education level: Not on file  Occupational History  . Occupation: Educational psychologist  Tobacco Use  . Smoking status: Former Research scientist (life sciences)  . Smokeless tobacco: Never Used  Substance and Sexual Activity  . Alcohol use: No    Alcohol/week: 0.0 standard drinks  . Drug use: No  . Sexual activity:  Never  Other Topics Concern  . Not on file  Social History Narrative   Married   Counsellor at [employer] Strain:   . Difficulty of Paying Living Expenses: Not on file  Food Insecurity:   . Worried About Charity fundraiser in the Last Year: Not on file  . Ran Out of Food in the Last Year: Not on file  Transportation Needs:   . Lack of Transportation (Medical): Not on file  . Lack of Transportation (Non-Medical): Not on file  Physical Activity:   . Days of Exercise per Week: Not on file  . Minutes of Exercise per Session:  Not on file  Stress:   . Feeling of Stress : Not on file  Social Connections:   . Frequency of Communication with Friends and Family: Not on file  . Frequency of Social Gatherings with Friends and Family: Not on file  . Attends Religious Services: Not on file  . Active Member of Clubs or Organizations: Not on file  . Attends Archivist Meetings: Not on file  . Marital Status: Not on file  Intimate Partner Violence:   . Fear of Current or Ex-Partner: Not on file  . Emotionally Abused: Not on file  . Physically Abused: Not on file  . Sexually Abused: Not on file    Review of Systems  Constitutional: Negative for fatigue and unexpected weight change.  Eyes: Negative for visual disturbance.  Respiratory: Negative for cough, chest tightness and shortness of breath.   Cardiovascular: Negative for chest pain, palpitations and leg swelling.  Gastrointestinal: Negative for abdominal pain and blood in stool.  Neurological: Negative for dizziness, light-headedness and headaches.     Objective:   Vitals:   12/20/19 0833 12/20/19 0909  BP: (!) 179/71 (!) 160/84  Pulse: 68   Temp: 98.5 F (36.9 C)   TempSrc: Temporal   SpO2: 96%   Weight: 244 lb (110.7 kg)   Height: 6' (1.829 m)      Physical Exam Vitals reviewed.  Constitutional:      Appearance: He is well-developed.  HENT:       Head: Normocephalic and atraumatic.  Eyes:     Pupils: Pupils are equal, round, and reactive to light.  Neck:     Vascular: No carotid bruit or JVD.  Cardiovascular:     Rate and Rhythm: Normal rate and regular rhythm.     Heart sounds: Normal heart sounds. No murmur heard.   Pulmonary:     Effort: Pulmonary effort is normal.     Breath sounds: Normal breath sounds. No rales.  Musculoskeletal:     Comments: L plantar foot. verrucal lesion on plantar 1st mtp area with surrounding callous.   Skin:    General: Skin is warm and dry.  Neurological:     Mental Status: He is alert and oriented to person, place, and time.     35 minutes spent during visit, greater than 50% counseling and assimilation of information, chart review, and discussion of plan.    Assessment & Plan:  NIJEL FLINK is a 74 y.o. male . Type 2 diabetes mellitus with obesity ([place]) - Plan: Comprehensive metabolic panel, Hemoglobin A1c, Microalbumin / creatinine urine ratio  -Check A1c, urine microalbumin.  Continue same dose of Metformin.  Need for prophylactic vaccination and inoculation against influenza - Plan: Flu Vaccine QUAD High Dose(Fluad)   Essential hypertension, benign - Plan: Lipid panel, Comprehensive metabolic panel  -Decreased control, has been well controlled previously.  Repeat evaluation in 3 weeks, home monitoring recommended.  No med changes for now.  Hyperlipidemia, unspecified hyperlipidemia type - Plan: Lipid panel, Comprehensive metabolic panel  -Tolerating statin, continue same  Vaccine counseling  -Strongly recommended COVID-19 vaccine, all questions answered.  Resources provided.  Suspected plantar wart, plans to discuss further at follow-up in 3 weeks.  No orders of the defined types were placed in this encounter.  Patient Instructions    Here is a link to information from the CDC on the Covid vaccine.  Please let me know if you have any questions on anything you have read  on that site  or other further questions regarding that vaccine.  I do recommend you get vaccinated against COVID-19 as soon as possible as you do have some increased risk of complications with that infection. Here is a link to information about the COVID-19 vaccine: [employer] PhoneStatistics.is  Area on the foot may be a plantar wart.  Please follow-up in the next 3 to 4 weeks and we can evaluate that area further and possible treatment in office at that time  Blood pressure higher than usual. Keep a record of your blood pressures outside of the office and bring them to the next office visit.    How to Take Your Blood Pressure You can take your blood pressure at home with a machine. You may need to check your blood pressure at home:  To check if you have high blood pressure (hypertension).  To check your blood pressure over time.  To make sure your blood pressure medicine is working. Supplies needed: You will need a blood pressure machine, or monitor. You can buy one at a drugstore or online. When choosing one:  Choose one with an arm cuff.  Choose one that wraps around your upper arm. Only one finger should fit between your arm and the cuff.  Do not choose one that measures your blood pressure from your wrist or finger. Your doctor can suggest a monitor. How to prepare Avoid these things for 30 minutes before checking your blood pressure:  Drinking caffeine.  Drinking alcohol.  Eating.  Smoking.  Exercising. Five minutes before checking your blood pressure:  Pee.  Sit in a dining chair. Avoid sitting in a soft couch or armchair.  Be quiet. Do not talk. How to take your blood pressure Follow the instructions that came with your machine. If you have a digital blood pressure monitor, these may be the instructions: 1. Sit up straight. 2. Place your feet on the floor. Do not cross your ankles or  legs. 3. Rest your left arm at the level of your heart. You may rest it on a table, desk, or chair. 4. Pull up your shirt sleeve. 5. Wrap the blood pressure cuff around the upper part of your left arm. The cuff should be 1 inch (2.5 cm) above your elbow. It is best to wrap the cuff around bare skin. 6. Fit the cuff snugly around your arm. You should be able to place only one finger between the cuff and your arm. 7. Put the cord inside the groove of your elbow. 8. Press the power button. 9. Sit quietly while the cuff fills with air and loses air. 10. Write down the numbers on the screen. 11. Wait 2-3 minutes and then repeat steps 1-10. What do the numbers mean? Two numbers make up your blood pressure. The first number is called systolic pressure. The second is called diastolic pressure. An example of a blood pressure reading is "120 over 80" (or 120/80). If you are an adult and do not have a medical condition, use this guide to find out if your blood pressure is normal: Normal  First number: below 120.  Second number: below 80. Elevated  First number: 120-129.  Second number: below 80. Hypertension stage 1  First number: 130-139.  Second number: 80-89. Hypertension stage 2  First number: 140 or above.  Second number: 38 or above. Your blood pressure is above normal even if only the top or bottom number is above normal. Follow these instructions at home:  Check your blood pressure as  often as your doctor tells you to.  Take your monitor to your next doctor's appointment. Your doctor will: ? Make sure you are using it correctly. ? Make sure it is working right.  Make sure you understand what your blood pressure numbers should be.  Tell your doctor if your medicines are causing side effects. Contact a doctor if:  Your blood pressure keeps being high. Get help right away if:  Your first blood pressure number is higher than 180.  Your second blood pressure number is  higher than 120. This information is not intended to replace advice given to you by your health care provider. Make sure you discuss any questions you have with your health care provider. Document Revised: 03/12/2017 Document Reviewed: 09/06/2015 Elsevier Patient Education  [place].   Type 2 Diabetes Mellitus, Self Care, Adult Caring for yourself after you have been diagnosed with type 2 diabetes (type 2 diabetes mellitus) means keeping your blood sugar (glucose) under control with a balance of:  Nutrition.  Exercise.  Lifestyle changes.  Medicines or insulin, if necessary.  Support from your team of health care providers and others. The following information explains what you need to know to manage your diabetes at home. What are the risks? Having diabetes can put you at risk for other long-term (chronic) conditions, such as heart disease and kidney disease. Your health care provider may prescribe medicines to help prevent complications from diabetes. These medicines may include:  Aspirin.  Medicine to lower cholesterol.  Medicine to control blood pressure. How to monitor blood glucose   Check your blood glucose every day, as often as told by your health care provider.  Have your A1c (hemoglobin A1c) level checked two or more times a year, or as often as told by your health care provider. Your health care provider will set individualized treatment goals for you. Generally, the goal of treatment is to maintain the following blood glucose levels:  Before meals (preprandial): 80-130 mg/dL (4.4-7.2 mmol/L).  After meals (postprandial): below 180 mg/dL (10 mmol/L).  A1c level: less than 7%. How to manage hyperglycemia and hypoglycemia Hyperglycemia symptoms Hyperglycemia, also called high blood glucose, occurs when blood glucose is too high. Make sure you know the early signs of hyperglycemia, such as:  Increased thirst.  Hunger.  Feeling very tired.  Needing  to urinate more often than usual.  Blurry vision. Hypoglycemia symptoms Hypoglycemia, also called low blood glucose, occurswith a blood glucose level at or below 70 mg/dL (3.9 mmol/L). The risk for hypoglycemia increases during or after exercise, during sleep, during illness, and when skipping meals or not eating for a long time (fasting). It is important to know the symptoms of hypoglycemia and treat it right away. Always have a 15-gram rapid-acting carbohydrate snack with you to treat low blood glucose. Family members and close friends should also know the symptoms and should understand how to treat hypoglycemia, in case you are not able to treat yourself. Symptoms may include:  Hunger.  Anxiety.  Sweating and feeling clammy.  Confusion.  Dizziness or feeling light-headed.  Sleepiness.  Nausea.  Increased heart rate.  Headache.  Blurry vision.  Irritability.  A change in coordination.  Tingling or numbness around the mouth, lips, or tongue.  Restless sleep.  Fainting.  Seizure. Treating hypoglycemia If you are alert and able to swallow safely, follow the 15:15 rule:  Take 15 grams of a rapid-acting carbohydrate. Talk with your health care provider about how much you should  take.  Rapid-acting options include: ? Glucose pills (take 15 grams). ? 6-8 pieces of hard candy. ? 4-6 oz (120-150 mL) of fruit juice. ? 4-6 oz (120-150 mL) of regular (not diet) soda. ? 1 Tbsp (15 mL) honey or sugar.  Check your blood glucose 15 minutes after you take the carbohydrate.  If the repeat blood glucose level is still at or below 70 mg/dL (3.9 mmol/L), take 15 grams of a carbohydrate again.  If your blood glucose level does not increase above 70 mg/dL (3.9 mmol/L) after 3 tries, seek emergency medical care.  After your blood glucose level returns to normal, eat a meal or a snack within 1 hour. Treating severe hypoglycemia Severe hypoglycemia is when your blood glucose level  is at or below 54 mg/dL (3 mmol/L). Severe hypoglycemia is an emergency. Do not wait to see if the symptoms will go away. Get medical help right away. Call your local emergency services (911 in the [place] If you have severe hypoglycemia and you cannot eat or drink, you may need an injection of glucagon. A family member or close friend should learn how to check your blood glucose and how to give you a glucagon injection. Ask your health care provider if you need to have an emergency glucagon injection kit available. Severe hypoglycemia may need to be treated in a hospital. The treatment may include getting glucose through an IV. You may also need treatment for the cause of your hypoglycemia. Follow these instructions at home: Take diabetes medicines as told  If your health care provider prescribed insulin or diabetes medicines, take them every day.  Do not run out of insulin or other diabetes medicines that you take. Plan ahead so you always have these available.  If you use insulin, adjust your dosage based on how physically active you are and what foods you eat. Your health care provider will tell you how to adjust your dosage. Make healthy food choices  The things that you eat and drink affect your blood glucose and your insulin dosage. Making good choices helps to control your diabetes and prevent other health problems. A healthy meal plan includes eating lean proteins, complex carbohydrates, fresh fruits and vegetables, low-fat dairy products, and healthy fats. Make an appointment to see a diet and nutrition specialist (registered dietitian) to help you create an eating plan that is right for you. Make sure that you:  Follow instructions from your health care provider about eating or drinking restrictions.  Drink enough fluid to keep your urine pale yellow.  Keep a record of the carbohydrates that you eat. Do this by reading food labels and learning the standard serving sizes of  foods.  Follow your sick day plan whenever you cannot eat or drink as usual. Make this plan in advance with your health care provider.  Stay active Exercise regularly, as told by your health care provider. This may include:  Stretching and doing strength exercises, such as yoga or weightlifting, 2 or more times a week.  Doing 150 minutes or more of moderate-intensity or vigorous-intensity exercise each week. This could be brisk walking, biking, or water aerobics. ? Spread out your activity over 3 or more days of the week. ? Do not go more than 2 days in a row without doing some kind of physical activity. When you start a new exercise or activity, work with your health care provider to adjust your insulin, medicines, or food intake as needed. Make healthy lifestyle choices  Do  not use any tobacco products, such as cigarettes, chewing tobacco, and e-cigarettes. If you need help quitting, ask your health care provider.  If your health care provider says that alcohol is safe for you, limit alcohol intake to no more than 1 drink per day for nonpregnant women and 2 drinks per day for men. One drink equals 12 oz of beer (355 mL), 5 oz of wine (148 mL), or 1 oz of hard liquor (44 mL).  Learn to manage stress. If you need help with this, ask your health care provider. Care for your body   Keep your immunizations up to date. In addition to getting vaccinations as told by your health care provider, it is recommended that you get vaccinated against the following illnesses: ? The flu (influenza). Get a flu shot every year. ? Pneumonia. ? Hepatitis B.  Schedule an eye exam soon after your diagnosis, and then one time every year after that.  Check your skin and feet every day for cuts, bruises, redness, blisters, or sores. Schedule a foot exam with your health care provider once every year.  Brush your teeth and gums two times a day, and floss one or more times a day. Visit your dentist one or more  times every 6 months.  Maintain a healthy weight. General instructions  Take over-the-counter and prescription medicines only as told by your health care provider.  Share your diabetes management plan with people in your workplace, school, and household.  Carry a medical alert card or wear medical alert jewelry.  Keep all follow-up visits as told by your health care provider. This is important. Questions to ask your health care provider  Do I need to meet with a diabetes educator?  Where can I find a support group for people with diabetes? Where to find more information For more information about diabetes, visit:  American Diabetes Association (ADA): www.diabetes.org  American Association of Diabetes Educators (AADE): www.diabeteseducator.org Summary  Caring for yourself after you have been diagnosed with (type 2 diabetes mellitus) means keeping your blood sugar (glucose) under control with a balance of nutrition, exercise, lifestyle changes, and medicine.  Check your blood glucose every day, as often as told by your health care provider.  Having diabetes can put you at risk for other long-term (chronic) conditions, such as heart disease and kidney disease. Your health care provider may prescribe medicines to help prevent complications from diabetes.  Keep all follow-up visits as told by your health care provider. This is important. This information is not intended to replace advice given to you by your health care provider. Make sure you discuss any questions you have with your health care provider. Document Revised: 09/20/2017 Document Reviewed: 05/03/2015 Elsevier Patient Education  [employer].    If you have lab work done today you will be contacted with your lab results within the next 2 weeks.  If you have not heard from [place] then please contact us. The fastest way to get your results is to register for My Chart.   IF you received an x-ray today, you will receive  an invoice from [facility]. Please contact [facility] at [phone] with questions or concerns regarding your invoice.   IF you received labwork today, you will receive an invoice from [facility]. Please contact [facility] at [phone] with questions or concerns regarding your invoice.   Our billing staff will not be able to assist you with questions regarding bills from these companies.  You will be contacted  with the lab results as soon as they are available. The fastest way to get your results is to activate your My Chart account. Instructions are located on the last page of this paperwork. If you have not heard from us regarding the results in 2 weeks, please contact this office.         Signed, Shaquon Gropp, MD [facility]  

## 2019-12-21 LAB — COMPREHENSIVE METABOLIC PANEL
ALT: 21 IU/L (ref 0–44)
AST: 19 IU/L (ref 0–40)
Albumin/Globulin Ratio: 2 (ref 1.2–2.2)
Albumin: 4.7 g/dL (ref 3.7–4.7)
Alkaline Phosphatase: 49 IU/L (ref 48–121)
BUN/Creatinine Ratio: 22 (ref 10–24)
BUN: 17 mg/dL (ref 8–27)
Bilirubin Total: 0.6 mg/dL (ref 0.0–1.2)
CO2: 22 mmol/L (ref 20–29)
Calcium: 9.1 mg/dL (ref 8.6–10.2)
Chloride: 106 mmol/L (ref 96–106)
Creatinine, Ser: 0.78 mg/dL (ref 0.76–1.27)
GFR calc Af Amer: 104 mL/min/{1.73_m2} (ref >59)
GFR calc non Af Amer: 90 mL/min/{1.73_m2} (ref >59)
Globulin, Total: 2.3 g/dL (ref 1.5–4.5)
Glucose: 128 mg/dL — ABNORMAL HIGH (ref 65–99)
Potassium: 4.1 mmol/L (ref 3.5–5.2)
Sodium: 142 mmol/L (ref 134–144)
Total Protein: 7 g/dL (ref 6.0–8.5)

## 2019-12-21 LAB — HEMOGLOBIN A1C
Est. average glucose Bld gHb Est-mCnc: 131 mg/dL
Hgb A1c MFr Bld: 6.2 % — ABNORMAL HIGH (ref 4.8–5.6)

## 2019-12-21 LAB — LIPID PANEL
Chol/HDL Ratio: 2.5 ratio (ref 0.0–5.0)
Cholesterol, Total: 117 mg/dL (ref 100–199)
HDL: 46 mg/dL (ref >39)
LDL Chol Calc (NIH): 56 mg/dL (ref 0–99)
Triglycerides: 75 mg/dL (ref 0–149)
VLDL Cholesterol Cal: 15 mg/dL (ref 5–40)

## 2019-12-21 LAB — MICROALBUMIN / CREATININE URINE RATIO
Creatinine, Urine: 113.8 mg/dL
Microalb/Creat Ratio: 9 mg/g creat (ref 0–29)
Microalbumin, Urine: 10 ug/mL

## 2019-12-26 ENCOUNTER — Encounter: Payer: Self-pay | Admitting: Radiology

## 2020-01-05 ENCOUNTER — Other Ambulatory Visit: Payer: Self-pay

## 2020-01-05 ENCOUNTER — Ambulatory Visit (INDEPENDENT_AMBULATORY_CARE_PROVIDER_SITE_OTHER): Payer: Medicare HMO | Admitting: Family Medicine

## 2020-01-05 ENCOUNTER — Encounter: Payer: Self-pay | Admitting: Family Medicine

## 2020-01-05 ENCOUNTER — Ambulatory Visit (INDEPENDENT_AMBULATORY_CARE_PROVIDER_SITE_OTHER): Payer: Medicare HMO

## 2020-01-05 VITALS — BP 140/70 | HR 65 | Temp 98.7°F | Ht 72.0 in | Wt 243.0 lb

## 2020-01-05 DIAGNOSIS — L84 Corns and callosities: Secondary | ICD-10-CM | POA: Diagnosis not present

## 2020-01-05 DIAGNOSIS — M79672 Pain in left foot: Secondary | ICD-10-CM

## 2020-01-05 DIAGNOSIS — I10 Essential (primary) hypertension: Secondary | ICD-10-CM | POA: Diagnosis not present

## 2020-01-05 DIAGNOSIS — E669 Obesity, unspecified: Secondary | ICD-10-CM | POA: Diagnosis not present

## 2020-01-05 DIAGNOSIS — E1169 Type 2 diabetes mellitus with other specified complication: Secondary | ICD-10-CM

## 2020-01-05 DIAGNOSIS — M19072 Primary osteoarthritis, left ankle and foot: Secondary | ICD-10-CM | POA: Diagnosis not present

## 2020-01-05 NOTE — Patient Instructions (Addendum)
  Keep a record of your blood pressures outside of the office and send me some readings if possible in next 4-6 weeks. Blood pressure borderline, but better today.   Foot callus could be related to some wear-and-tear changes in the knuckle of the first toe/foot bone.  It is best that a podiatrist evaluate that area and pare  down that callus. They may also be able to provide some inserts or change in footwear to lessen the chance of recurrence.  I will place that referral.  Okay to continue to pad around area for now.    If you have lab work done today you will be contacted with your lab results within the next 2 weeks.  If you have not heard from [place] then please contact us. The fastest way to get your results is to register for My Chart.   IF you received an x-ray today, you will receive an invoice from [facility]. Please contact [facility] at [phone] with questions or concerns regarding your invoice.   IF you received labwork today, you will receive an invoice from [place]. Please contact [facility] at [phone] with questions or concerns regarding your invoice.   Our billing staff will not be able to assist you with questions regarding bills from these companies.  You will be contacted with the lab results as soon as they are available. The fastest way to get your results is to activate your My Chart account. Instructions are located on the last page of this paperwork. If you have not heard from [place] regarding the results in 2 weeks, please contact this office.

## 2020-01-05 NOTE — Progress Notes (Signed)
Subjective:  Patient ID: Jeffery George, male    DOB: 08-18-1945  Age: 73 y.o. MRN: [id number]  CC:  Chief Complaint  Patient presents with  . Follow-up    on foot callus/wart and BP recheck. PT reports no change to his condition since last OV. PT reports not checking his BP at home. PT L foot has a callus/wart.    HPI Jeffery George presents for   Hypertension: Decreased control last visit, had been well controlled previously.  Home monitoring recommended, continued same regimen with amlodipine, losartan.  Home readings:none.  BP Readings from Last 3 Encounters:  01/05/20 140/70  12/20/19 (!) 160/84  06/19/19 128/76   Lab Results  Component Value Date   CREATININE 0.78 12/20/2019   Left foot lesion Possible verruca lesion on the plantar first MTP area with surrounding callus noted at his last visit September 8.  Present 2-3 months. Still sore in area. Wearing pad, has applied otc treatment to soften skin every 3 weeks, and able to remove some of the callous. Better for a few days, then forms new callous. Tried callous treatment otc early on - no relief.  No current podiatrist.  Wears shoes at all times, nki. Does not remember stepping on anything.   History Patient Active Problem List   Diagnosis Date Noted  . Rotator cuff syndrome, left 11/15/2018  . Chronic left shoulder pain 08/09/2018  . Left retinal detachment 08/11/2017  . Gout 02/27/2013  . Essential hypertension, benign 02/27/2013  . Type II or unspecified type diabetes mellitus without mention of complication, uncontrolled 02/27/2013   Past Medical History:  Diagnosis Date  . Arthritis   . Diabetes mellitus without complication ([place])   . Heart murmur   . Hypertension    Past Surgical History:  Procedure Laterality Date  . CATARACT EXTRACTION    . FRACTURE SURGERY     Lt ankle  . GAS/FLUID EXCHANGE Left 08/12/2017   Procedure: C3F8 GAS/FLUID EXCHANGE;  Surgeon: Bernarda Caffey, MD;  Location: [place];   Service: Ophthalmology;  Laterality: Left;  . HERNIA REPAIR    . IRIDOTOMY / IRIDECTOMY Bilateral    stents  . VASECTOMY    . VITRECTOMY 25 GAUGE WITH SCLERAL BUCKLE Left 08/12/2017   Procedure: VITRECTOMY 25 GAUGE WITH SCLERAL BUCKLE WITH ENDOLASER;  Surgeon: Bernarda Caffey, MD;  Location: [place];  Service: Ophthalmology;  Laterality: Left;   No Known Allergies Prior to Admission medications   Medication Sig Start Date End Date Taking? Authorizing Provider  ACCU-CHEK SOFTCLIX LANCETS lancets Test blood sugar once daily. Dx E11.9 08/26/15  Yes [name], MD  amLODipine (NORVASC) 5 MG tablet TAKE 1 TABLET DAILY 06/19/19  Yes [name], MD  aspirin EC 81 MG tablet Take 1 tablet (81 mg total) by mouth daily. 02/27/13  Yes Shawnee Knapp, MD  atorvastatin (LIPITOR) 10 MG tablet Take 1 tablet (10 mg total) by mouth daily. 06/19/19  Yes [name], MD  blood glucose meter kit and supplies TrueMetrix, TruTrack, Accu-Chek Aviva Expert, Accu-Chek Aviva Plus, Accu-Chek Guide, Accu-Chek Nano, or Accu-Chek Smartview.   Use once per day. 07/31/17  Yes [name], MD  Blood Glucose Monitoring Suppl (BLOOD GLUCOSE MONITOR KIT) KIT Use to test blood sugar twice daily. 07/17/13  Yes [name], MD  colchicine (COLCRYS) 0.6 MG tablet Take 2 tabs po at symptom onset. Repeat 1 tab po 1 hour later. 06/22/17  Yes [name], MD  Lancets (  ONETOUCH ULTRASOFT) lancets Use as instructed 06/05/13  Yes [name], MD  latanoprost (XALATAN) 0.005 % ophthalmic solution Place 1 drop into both eyes every evening.  07/23/17  Yes [provider]  losartan (COZAAR) 100 MG tablet Take 1 tablet (100 mg total) by mouth daily. 06/19/19  Yes [name], MD  metFORMIN (GLUCOPHAGE) 500 MG tablet Take 1 tablet (500 mg total) by mouth daily with breakfast. 06/19/19  Yes [name], MD  Omega-3 Fatty Acids (FISH OIL) 1000 MG CAPS Take by mouth daily.    Yes [provider]    TRUE METRIX BLOOD GLUCOSE TEST test strip TEST ONE TIME DAILY 03/11/18  Yes [name], MD  TRUEPLUS LANCETS 33G MISC TEST ONE TIME DAILY 03/11/18  Yes [name], MD   Social History   Socioeconomic History  . Marital status: Married    Spouse name: Not on file  . Number of children: Not on file  . Years of education: Not on file  . Highest education level: Not on file  Occupational History  . Occupation: Educational psychologist  Tobacco Use  . Smoking status: Former Research scientist (life sciences)  . Smokeless tobacco: Never Used  Substance and Sexual Activity  . Alcohol use: No    Alcohol/week: 0.0 standard drinks  . Drug use: No  . Sexual activity: Never  Other Topics Concern  . Not on file  Social History Narrative   Married   Counsellor at [employer] Strain:   . Difficulty of Paying Living Expenses: Not on file  Food Insecurity:   . Worried About Charity fundraiser in the Last Year: Not on file  . Ran Out of Food in the Last Year: Not on file  Transportation Needs:   . Lack of Transportation (Medical): Not on file  . Lack of Transportation (Non-Medical): Not on file  Physical Activity:   . Days of Exercise per Week: Not on file  . Minutes of Exercise per Session: Not on file  Stress:   . Feeling of Stress : Not on file  Social Connections:   . Frequency of Communication with Friends and Family: Not on file  . Frequency of Social Gatherings with Friends and Family: Not on file  . Attends Religious Services: Not on file  . Active Member of Clubs or Organizations: Not on file  . Attends Archivist Meetings: Not on file  . Marital Status: Not on file  Intimate Partner Violence:   . Fear of Current or Ex-Partner: Not on file  . Emotionally Abused: Not on file  . Physically Abused: Not on file  . Sexually Abused: Not on file    Review of Systems Per hpi  Objective:   Vitals:   01/05/20 1120  01/05/20 1122  BP: (!) 175/73 140/70  Pulse: 65   Temp: 98.7 F (37.1 C)   TempSrc: Temporal   SpO2: 97%   Weight: 243 lb (110.2 kg)   Height: 6' (1.829 m)      Physical Exam Constitutional:      General: He is not in acute distress.    Appearance: He is well-developed.  HENT:     Head: Normocephalic and atraumatic.  Cardiovascular:     Rate and Rhythm: Normal rate.  Pulmonary:     Effort: Pulmonary effort is normal.  Skin:    Comments: L plantar foot ttp over calloused area with  central small opening. See photo  Neurological:     Mental Status: He is alert and oriented to person, place, and time.      DG Foot 2 Views Left  Result Date: 01/05/2020 CLINICAL DATA:  Right plantar wound at the first MTP joint. Question osseous abnormality or foreign body. EXAM: LEFT FOOT - 2 VIEW COMPARISON:  None. FINDINGS: Degenerative changes are noted at the first MTP joint. No osseous erosion or foreign body is present. Microvascular calcifications are present. No radiopaque foreign body is present. IMPRESSION: 1. No radiopaque foreign body. 2. Degenerative changes at the first MTP joint. Electronically Signed   By: San Morelle M.D.   On: 01/05/2020 12:19     Assessment & Plan:  Jeffery George is a 74 y.o. male . Left foot pain - Plan: DG Foot 2 Views Left, Ambulatory referral to Podiatry  Pre-ulcerative corn or callous - Plan: DG Foot 2 Views Left, Ambulatory referral to Podiatry  Type 2 diabetes mellitus with obesity ([place]) - Plan: Ambulatory referral to Podiatry  Essential hypertension, benign  Blood pressure borderline, but improved from last visit.  Home monitoring recommended, no med changes for now.  Possible callus secondary to degenerative changes at MTP.  No sign of infection at this time.  With history of diabetes will refer to podiatry for evaluation, likely paring of callus, and can evaluate shoe wear/inserts at that time.  Silicone or other cushioning around the  area for now.  RTC precautions.  No orders of the defined types were placed in this encounter.  Patient Instructions    Keep a record of your blood pressures outside of the office and send me some readings if possible in next 4-6 weeks. Blood pressure borderline, but better today.   Foot callus could be related to some wear-and-tear changes in the knuckle of the first toe/foot bone.  It is best that a podiatrist evaluate that area and pare  down that callus. They may also be able to provide some inserts or change in footwear to lessen the chance of recurrence.  I will place that referral.  Okay to continue to pad around area for now.    If you have lab work done today you will be contacted with your lab results within the next 2 weeks.  If you have not heard from [place] then please contact us. The fastest way to get your results is to register for My Chart.   IF you received an x-ray today, you will receive an invoice from [facility]. Please contact [facility] Radiology at [phone] with questions or concerns regarding your invoice.   IF you received labwork today, you will receive an invoice from [place]. Please contact [facility] at [phone] with questions or concerns regarding your invoice.   Our billing staff will not be able to assist you with questions regarding bills from these companies.  You will be contacted with the lab results as soon as they are available. The fastest way to get your results is to activate your My Chart account. Instructions are located on the last page of this paperwork. If you have not heard from [place] regarding the results in 2 weeks, please contact this office.         Signed, Merri Ray, MD [facility]

## 2020-01-16 ENCOUNTER — Telehealth: Payer: Self-pay | Admitting: *Deleted

## 2020-01-16 NOTE — Telephone Encounter (Signed)
Schedule AWV.  

## 2020-02-02 ENCOUNTER — Other Ambulatory Visit: Payer: Self-pay | Admitting: Family Medicine

## 2020-02-02 DIAGNOSIS — E785 Hyperlipidemia, unspecified: Secondary | ICD-10-CM

## 2020-02-02 DIAGNOSIS — I10 Essential (primary) hypertension: Secondary | ICD-10-CM

## 2020-02-02 DIAGNOSIS — E119 Type 2 diabetes mellitus without complications: Secondary | ICD-10-CM

## 2020-04-22 ENCOUNTER — Ambulatory Visit: Payer: Self-pay | Admitting: *Deleted

## 2020-04-22 NOTE — Telephone Encounter (Signed)
Wife Brevan Luberto called in concerned that her husband has covid because Jeffery George has multiple symptoms.   Jeffery George was exposed to his daughter and a co worker you tested positive.   Jeffery George has not been tested for covid and has not had any of the vaccines.  Jeffery George is concerned because it's been 12 days since Jeffery George has really eaten anything.   Jeffery George is losing weight.   Jeffery George is drinking Gator Aid 0 but Jeffery George does not have an appetite.  Jeffery George is not tasting things that well right now. Jeffery George is coughing a dry hacking cough also.   Jeffery George has NIDDM.   Jeffery George is very thirsty and urinating a lot.   She thinks his glucose may be elevated.   Jeffery George hasn't checked it.   Jeffery George doesn't normally check it since Jeffery George is not on insulin.   Jeffery George takes metformin.  I let her know Jeffery George may be referred to the ED for IV fluids, etc.   They were agreeable to that if Dr Carlota Raspberry thought it was necessary.  Per the protocol his PCP needs to be contacted.  I'm sending a high priority note to [facility] on [address] for Dr. Merri Ray.  Wife and pt agreeable to having someone call them back. They can be reached at [phone].    Reason for Disposition . HIGH RISK for severe COVID complications (e.g., age > 80 years, obesity with BMI > 25, pregnant, chronic lung disease or other chronic medical condition)  (Exception: Already seen by PCP and no new or worsening symptoms.)    Having covid symptoms but not tested for it.  Answer Assessment - Initial Assessment Questions 1. COVID-19 DIAGNOSIS: "Who made your COVID-19 diagnosis?" "Was it confirmed by a positive lab test?" If not diagnosed by a HCP, ask "Are there lots of cases (community spread) where you live?" Note: See public health department website, if unsure.     Wife calling in.   Jeffery George is having covid symptoms.   Daughter and co worker have tested positive. Congestion, tired, no appetite.   Jeffery George has diabetes.  No insulin.  Takes Metformin. 2. COVID-19 EXPOSURE: "Was there any known exposure to COVID before the symptoms began?"  CDC Definition of close contact: within 6 feet (2 meters) for a total of 15 minutes or more over a 24-hour period.      See above Exposed daughter tested positive on 04/10/2020 3. ONSET: "When did the COVID-19 symptoms start?"      His symptoms started on 1/4. 4. WORST SYMPTOM: "What is your worst symptom?" (e.g., cough, fever, shortness of breath, muscle aches)     Headache, sore throat, coughing, body aches.  No fever. Jeffery George is not eating.   No appetite.   Fatigue severe.     Jeffery George has been taking his medicines.   Jeffery George hasn't eaten for 12 days.   Jeffery George can't taste all that well.    Jeffery George is very thirsty.  Jeffery George is drinking 0 Gator Aid.    His glucose is probably high.   No vomiting or diarrhea.   5. COUGH: "Do you have a cough?" If Yes, ask: "How bad is the cough?"       Yes 6. FEVER: "Do you have a fever?" If Yes, ask: "What is your temperature, how was it measured, and when did it start?"     Not checked it but Jeffery George has been hot.   No chills. 7. RESPIRATORY STATUS: "Describe your breathing?" (e.g., shortness of breath,  wheezing, unable to speak)      Breathing fine. 8. BETTER-SAME-WORSE: "Are you getting better, staying the same or getting worse compared to yesterday?"  If getting worse, ask, "In what way?"     Same not getting better 9. HIGH RISK DISEASE: "Do you have any chronic medical problems?" (e.g., asthma, heart or lung disease, weak immune system, obesity, etc.)     Diabetes,  10. VACCINE: "Have you gotten the COVID-19 vaccine?" If Yes ask: "Which one, how many shots, when did you get it?"       No covid vaccines 11. PREGNANCY: "Is there any chance you are pregnant?" "When was your last menstrual period?"       N/A 12. OTHER SYMPTOMS: "Do you have any other symptoms?"  (e.g., chills, fatigue, headache, loss of smell or taste, muscle pain, sore throat; new loss of smell or taste especially support the diagnosis of COVID-19)       *No Answer*  Protocols used: CORONAVIRUS (COVID-19) DIAGNOSED OR  SUSPECTED-A-AH

## 2020-04-23 ENCOUNTER — Other Ambulatory Visit: Payer: Self-pay

## 2020-04-23 ENCOUNTER — Ambulatory Visit (HOSPITAL_COMMUNITY)
Admission: EM | Admit: 2020-04-23 | Discharge: 2020-04-23 | Disposition: A | Discharge status: Home / Self Care | Payer: Medicare HMO | Attending: Family Medicine | Admitting: Family Medicine

## 2020-04-23 ENCOUNTER — Encounter (HOSPITAL_COMMUNITY): Payer: Self-pay

## 2020-04-23 DIAGNOSIS — R630 Anorexia: Secondary | ICD-10-CM | POA: Diagnosis not present

## 2020-04-23 DIAGNOSIS — U071 COVID-19: Secondary | ICD-10-CM | POA: Insufficient documentation

## 2020-04-23 DIAGNOSIS — R5383 Other fatigue: Secondary | ICD-10-CM | POA: Diagnosis not present

## 2020-04-23 DIAGNOSIS — R519 Headache, unspecified: Secondary | ICD-10-CM | POA: Diagnosis not present

## 2020-04-23 LAB — CBC WITH DIFFERENTIAL/PLATELET
Abs Immature Granulocytes: 0.03 10*3/uL (ref 0.00–0.07)
Basophils Absolute: 0 10*3/uL (ref 0.0–0.1)
Basophils Relative: 0 %
Eosinophils Absolute: 0 10*3/uL (ref 0.0–0.5)
Eosinophils Relative: 0 %
HCT: 40.1 % (ref 39.0–52.0)
Hemoglobin: 14 g/dL (ref 13.0–17.0)
Immature Granulocytes: 1 %
Lymphocytes Relative: 10 %
Lymphs Abs: 0.6 10*3/uL — ABNORMAL LOW (ref 0.7–4.0)
MCH: 33.5 pg (ref 26.0–34.0)
MCHC: 34.9 g/dL (ref 30.0–36.0)
MCV: 95.9 fL (ref 80.0–100.0)
Monocytes Absolute: 0.6 10*3/uL (ref 0.1–1.0)
Monocytes Relative: 10 %
Neutro Abs: 5.1 10*3/uL (ref 1.7–7.7)
Neutrophils Relative %: 79 %
Platelets: 202 10*3/uL (ref 150–400)
RBC: 4.18 MIL/uL — ABNORMAL LOW (ref 4.22–5.81)
RDW: 12.5 % (ref 11.5–15.5)
WBC: 6.4 10*3/uL (ref 4.0–10.5)
nRBC: 0 % (ref 0.0–0.2)

## 2020-04-23 LAB — COMPREHENSIVE METABOLIC PANEL
ALT: 81 U/L — ABNORMAL HIGH (ref 0–44)
AST: 60 U/L — ABNORMAL HIGH (ref 15–41)
Albumin: 3.2 g/dL — ABNORMAL LOW (ref 3.5–5.0)
Alkaline Phosphatase: 46 U/L (ref 38–126)
Anion gap: 13 (ref 5–15)
BUN: 23 mg/dL (ref 8–23)
CO2: 22 mmol/L (ref 22–32)
Calcium: 8.3 mg/dL — ABNORMAL LOW (ref 8.9–10.3)
Chloride: 98 mmol/L (ref 98–111)
Creatinine, Ser: 1.14 mg/dL (ref 0.61–1.24)
GFR, Estimated: >60 mL/min (ref >60)
Glucose, Bld: 125 mg/dL — ABNORMAL HIGH (ref 70–99)
Potassium: 3.7 mmol/L (ref 3.5–5.1)
Sodium: 133 mmol/L — ABNORMAL LOW (ref 135–145)
Total Bilirubin: 1.4 mg/dL — ABNORMAL HIGH (ref 0.3–1.2)
Total Protein: 7.4 g/dL (ref 6.5–8.1)

## 2020-04-23 LAB — POCT URINALYSIS DIPSTICK, ED / UC
Glucose, UA: NEGATIVE mg/dL
Hgb urine dipstick: NEGATIVE
Ketones, ur: 15 mg/dL — AB
Leukocytes,Ua: NEGATIVE
Nitrite: NEGATIVE
Protein, ur: 100 mg/dL — AB
Specific Gravity, Urine: 1.025 (ref 1.005–1.030)
Urobilinogen, UA: 1 mg/dL (ref 0.0–1.0)
pH: 6 (ref 5.0–8.0)

## 2020-04-23 LAB — CBG MONITORING, ED: Glucose-Capillary: 127 mg/dL — ABNORMAL HIGH (ref 70–99)

## 2020-04-23 LAB — SARS CORONAVIRUS 2 (TAT 6-24 HRS): SARS Coronavirus 2: POSITIVE — AB

## 2020-04-23 NOTE — ED Provider Notes (Signed)
[place]    CSN: [id number] Arrival date & time: 04/23/20  1159      History   Chief Complaint Chief Complaint  Patient presents with  . Headache    HPI Jeffery George is a 75 y.o. male.   Here today with 2-3 weeks of nonspecific loss of appetite, thirst, and one day had a headache but not any longer. He has been forcing himself to eat here and there but not been wanting to. When asked, has had a cough in this time frame as well. Denies CP, SOB, abdominal pain, N/V/D. Not trying anything for sxs. Notes lots of COVID + contacts through work. Hx of DM with BSs usually around 130. Compliant with chronic medications.      Past Medical History:  Diagnosis Date  . Arthritis   . Diabetes mellitus without complication ([place])   . Heart murmur   . Hypertension     Patient Active Problem List   Diagnosis Date Noted  . Rotator cuff syndrome, left 11/15/2018  . Chronic left shoulder pain 08/09/2018  . Left retinal detachment 08/11/2017  . Gout 02/27/2013  . Essential hypertension, benign 02/27/2013  . Type II or unspecified type diabetes mellitus without mention of complication, uncontrolled 02/27/2013    Past Surgical History:  Procedure Laterality Date  . CATARACT EXTRACTION    . FRACTURE SURGERY     Lt ankle  . GAS/FLUID EXCHANGE Left 08/12/2017   Procedure: C3F8 GAS/FLUID EXCHANGE;  Surgeon: Bernarda Caffey, MD;  Location: [place];  Service: Ophthalmology;  Laterality: Left;  . HERNIA REPAIR    . IRIDOTOMY / IRIDECTOMY Bilateral    stents  . VASECTOMY    . VITRECTOMY 25 GAUGE WITH SCLERAL BUCKLE Left 08/12/2017   Procedure: VITRECTOMY 25 GAUGE WITH SCLERAL BUCKLE WITH ENDOLASER;  Surgeon: Bernarda Caffey, MD;  Location: [place];  Service: Ophthalmology;  Laterality: Left;     Home Medications    Prior to Admission medications   Medication Sig Start Date End Date Taking? Authorizing Provider  ACCU-CHEK SOFTCLIX LANCETS lancets Test blood sugar once daily. Dx E11.9  08/26/15   [name], MD  amLODipine (NORVASC) 5 MG tablet TAKE 1 TABLET EVERY DAY 02/02/20   [name], MD  aspirin EC 81 MG tablet Take 1 tablet (81 mg total) by mouth daily. 02/27/13   Shawnee Knapp, MD  atorvastatin (LIPITOR) 10 MG tablet TAKE 1 TABLET BY MOUTH DAILY. 02/02/20   [name], MD  blood glucose meter kit and supplies TrueMetrix, TruTrack, Accu-Chek Aviva Expert, Accu-Chek Aviva Plus, Accu-Chek Guide, Accu-Chek Nano, or Accu-Chek Smartview.   Use once per day. 07/31/17   [name], MD  Blood Glucose Monitoring Suppl (BLOOD GLUCOSE MONITOR KIT) KIT Use to test blood sugar twice daily. 07/17/13   [name], MD  colchicine (COLCRYS) 0.6 MG tablet Take 2 tabs po at symptom onset. Repeat 1 tab po 1 hour later. 06/22/17   [name], MD  Lancets [facility] ULTRASOFT) lancets Use as instructed 06/05/13   [name], MD  latanoprost (XALATAN) 0.005 % ophthalmic solution Place 1 drop into both eyes every evening.  07/23/17   [provider]  losartan (COZAAR) 100 MG tablet TAKE 1 TABLET BY MOUTH DAILY. 02/02/20   [name], MD  metFORMIN (GLUCOPHAGE) 500 MG tablet TAKE 1 TABLET BY MOUTH DAILY WITH BREAKFAST. 02/02/20   [name], MD  Omega-3 Fatty Acids (FISH OIL) 1000 MG  CAPS Take by mouth daily.     [provider]  TRUE METRIX BLOOD GLUCOSE TEST test strip TEST ONE TIME DAILY 03/11/18   [name], MD  TRUEPLUS LANCETS 33G MISC TEST ONE TIME DAILY 03/11/18   [name], MD    Family History Family History  Problem Relation Age of Onset  . Cancer Mother   . Diabetes Mother   . Stroke Mother   . Heart disease Father   . Stroke Father   . Diabetes Brother   . Heart disease Brother   . Diabetes Brother   . Colon cancer Neg Hx     Social History Social History   Tobacco Use  . Smoking status: Former Research scientist (life sciences)  . Smokeless tobacco: Never Used  Substance Use Topics  . Alcohol use: No     Alcohol/week: 0.0 standard drinks  . Drug use: No     Allergies   Patient has no known allergies.   Review of Systems Review of Systems PER HPI    Physical Exam Triage Vital Signs ED Triage Vitals  Enc Vitals Group     BP 04/23/20 1303 137/69     Pulse Rate 04/23/20 1303 92     Resp 04/23/20 1303 16     Temp 04/23/20 1303 99.3 F (37.4 C)     Temp Source 04/23/20 1303 Oral     SpO2 04/23/20 1303 94 %     Weight --      Height --      Head Circumference --      Peak Flow --      Pain Score 04/23/20 1301 0     Pain Loc --      Pain Edu? --      Excl. in [place]? --    No data found.  Updated Vital Signs BP 137/69 (BP Location: Right Arm)   Pulse 92   Temp 99.3 F (37.4 C) (Oral)   Resp 16   SpO2 95%   Visual Acuity Right Eye Distance:   Left Eye Distance:   Bilateral Distance:    Right Eye Near:   Left Eye Near:    Bilateral Near:     Physical Exam Vitals and nursing note reviewed.  Constitutional:      Appearance: Normal appearance.  HENT:     Head: Atraumatic.     Nose: Nose normal.     Mouth/Throat:     Mouth: Mucous membranes are moist.     Pharynx: Oropharynx is clear. No oropharyngeal exudate.  Eyes:     Extraocular Movements: Extraocular movements intact.     Conjunctiva/sclera: Conjunctivae normal.  Cardiovascular:     Rate and Rhythm: Normal rate and regular rhythm.  Pulmonary:     Effort: Pulmonary effort is normal.     Breath sounds: Normal breath sounds.  Abdominal:     General: Bowel sounds are normal. There is no distension.     Palpations: Abdomen is soft.     Tenderness: There is no abdominal tenderness. There is no guarding.  Musculoskeletal:        General: Normal range of motion.     Cervical back: Normal range of motion and neck supple.  Skin:    General: Skin is warm and dry.     Findings: No rash.  Neurological:     General: No focal deficit present.     Mental Status: He is oriented to person, place, and time.  Cranial Nerves: No cranial nerve deficit.     Motor: No weakness.     Gait: Gait normal.  Psychiatric:        Mood and Affect: Mood normal.        Thought Content: Thought content normal.        Judgment: Judgment normal.      UC Treatments / Results  Labs (all labs ordered are listed, but only abnormal results are displayed) Labs Reviewed  CBG MONITORING, ED - Abnormal; Notable for the following components:      Result Value   Glucose-Capillary 127 (*)    All other components within normal limits  POCT URINALYSIS DIPSTICK, ED / UC - Abnormal; Notable for the following components:   Bilirubin Urine SMALL (*)    Ketones, ur 15 (*)    Protein, ur 100 (*)    All other components within normal limits  SARS CORONAVIRUS 2 (TAT 6-24 HRS)  CBC WITH DIFFERENTIAL/PLATELET  COMPREHENSIVE METABOLIC PANEL    EKG   Radiology No results found.  Procedures Procedures (including critical care time)  Medications Ordered in UC Medications - No data to display  Initial Impression / Assessment and Plan / UC Course  I have reviewed the triage vital signs and the nursing notes.  Pertinent labs & imaging results that were available during my care of the patient were reviewed by me and considered in my medical decision making (see chart for details).     Complaints today very nonspecific - exam and vital signs reassuring, U/A without evidence of UTI, labs pending, COVID pcr pending. Discussed increasing fluids and solids, rest, close PCP f/u for recheck and further evaluation if needed. Return if worsening sxs.   Final Clinical Impressions(s) / UC Diagnoses   Final diagnoses:  Decreased appetite  Fatigue, unspecified type     Discharge Instructions     Follow up as soon as able with Primary Care provider to re-check symptoms and further evaluate    ED Prescriptions    None     PDMP not reviewed this encounter.   Volney American, [place] 04/23/20 1503

## 2020-04-23 NOTE — Discharge Instructions (Signed)
Follow up as soon as able with Primary Care provider to re-check symptoms and further evaluate

## 2020-04-23 NOTE — ED Triage Notes (Signed)
Pt c/o loss of appetite and being thirsty constantly. He states he only drinks water. Pt states these sxs have been going on for 2 weeks. He states he has had a headache x 3 weeks. He states he has not taken OTC medicine.

## 2020-04-23 NOTE — ED Notes (Signed)
Re-eval pt per CMA notification of SpO2 94%-95%. No acute signs of distress noted. Pt c/o recent HA, cough and exposure to multiple co-workers with URI symptoms. Pt denies SOB, CP.  Bilateral lung sounds CTA, CBG performed.

## 2020-04-24 ENCOUNTER — Telehealth: Payer: Self-pay

## 2020-04-24 NOTE — Telephone Encounter (Signed)
Contacted pt. In regard to COVID 19 infusion therapies. Reports his symptoms started the end of December. Pt. Is out of window for therapy.Given [facility] number.

## 2020-04-25 ENCOUNTER — Encounter: Payer: Self-pay | Admitting: Family Medicine

## 2020-04-25 ENCOUNTER — Other Ambulatory Visit: Payer: Self-pay

## 2020-04-25 ENCOUNTER — Telehealth (INDEPENDENT_AMBULATORY_CARE_PROVIDER_SITE_OTHER): Payer: Medicare HMO | Admitting: Family Medicine

## 2020-04-25 VITALS — Ht 72.0 in

## 2020-04-25 DIAGNOSIS — R7401 Elevation of levels of liver transaminase levels: Secondary | ICD-10-CM

## 2020-04-25 DIAGNOSIS — R059 Cough, unspecified: Secondary | ICD-10-CM | POA: Diagnosis not present

## 2020-04-25 DIAGNOSIS — E871 Hypo-osmolality and hyponatremia: Secondary | ICD-10-CM | POA: Diagnosis not present

## 2020-04-25 DIAGNOSIS — U071 COVID-19: Secondary | ICD-10-CM | POA: Diagnosis not present

## 2020-04-25 NOTE — Progress Notes (Addendum)
Virtual Visit via Telephone Note  I connected with Jeffery George on 04/25/20 at 6:15 PM by telephone and verified that I am speaking with the correct person using two identifiers. Patient location:home.  My location: office  I discussed the limitations, risks, security and privacy concerns of performing an evaluation and management service by telephone and the availability of in person appointments. I also discussed with the patient that there may be a patient responsible charge related to this service. The patient expressed understanding and agreed to proceed, consent obtained  Chief complaint: Chief Complaint  Patient presents with  . Covid Positive    Pt tested Positive for COIVD on 04/23/2020. Pt reports having a dry cough, weak sense of smell, fatigue,and body aches. Pt has stayed home since testing positive. Symptoms started on 04/13/2020.   History of Present Illness: Jeffery George is a 75 y.o. male  Covid 2 infection: Initial symptoms 12/27 - sore throat, fatigue. Did not have covid test, but daughter had similar symptoms around 12/27, she had positive test.  Started to improve for a few days, then started to get worse around 1/2-1/3. More fatigue, decreased appetite. More cough recently - past 3 days.  No dyspnea. No chest pains.  Occasional disorientation form not having much to eat. No syncope.  Drinking lots of fluids, urinating normally.  [facility] eval 1/11. Positive covid test at that time. Na 133, glucose 125 ca 8.3, AST 60 ALT81. Bili 1.4.  Feels about the same as Urgent care today. No further headache/bodyache.  Primary issue is fatigue.  Tx: Robitussin DM.   Has not been vaccinated for covid 19.    Patient Active Problem List   Diagnosis Date Noted  . Rotator cuff syndrome, left 11/15/2018  . Chronic left shoulder pain 08/09/2018  . Left retinal detachment 08/11/2017  . Gout 02/27/2013  . Essential hypertension, benign 02/27/2013  . Type II or  unspecified type diabetes mellitus without mention of complication, uncontrolled 02/27/2013   Past Medical History:  Diagnosis Date  . Arthritis   . Diabetes mellitus without complication ([place])   . Heart murmur   . Hypertension    Past Surgical History:  Procedure Laterality Date  . CATARACT EXTRACTION    . FRACTURE SURGERY     Lt ankle  . GAS/FLUID EXCHANGE Left 08/12/2017   Procedure: C3F8 GAS/FLUID EXCHANGE;  Surgeon: Bernarda Caffey, MD;  Location: [facility];  Service: Ophthalmology;  Laterality: Left;  . HERNIA REPAIR    . IRIDOTOMY / IRIDECTOMY Bilateral    stents  . VASECTOMY    . VITRECTOMY 25 GAUGE WITH SCLERAL BUCKLE Left 08/12/2017   Procedure: VITRECTOMY 25 GAUGE WITH SCLERAL BUCKLE WITH ENDOLASER;  Surgeon: Bernarda Caffey, MD;  Location: [facility];  Service: Ophthalmology;  Laterality: Left;   No Known Allergies Prior to Admission medications   Medication Sig Start Date End Date Taking? Authorizing Provider  ACCU-CHEK SOFTCLIX LANCETS lancets Test blood sugar once daily. Dx E11.9 08/26/15  Yes [name], MD  amLODipine (NORVASC) 5 MG tablet TAKE 1 TABLET EVERY DAY 02/02/20  Yes [name], MD  aspirin EC 81 MG tablet Take 1 tablet (81 mg total) by mouth daily. 02/27/13  Yes Shawnee Knapp, MD  atorvastatin (LIPITOR) 10 MG tablet TAKE 1 TABLET BY MOUTH DAILY. 02/02/20  Yes [name], MD  blood glucose meter kit and supplies TrueMetrix, TruTrack, Accu-Chek Aviva Expert, Accu-Chek Aviva Plus, Accu-Chek Guide, Accu-Chek Nano, or Accu-Chek Smartview.  Use once per day. 07/31/17  Yes [name], MD  Blood Glucose Monitoring Suppl (BLOOD GLUCOSE MONITOR KIT) KIT Use to test blood sugar twice daily. 07/17/13  Yes [name], MD  colchicine (COLCRYS) 0.6 MG tablet Take 2 tabs po at symptom onset. Repeat 1 tab po 1 hour later. 06/22/17  Yes [name], MD  Lancets [facility] ULTRASOFT) lancets Use as instructed 06/05/13  Yes [name], MD   latanoprost (XALATAN) 0.005 % ophthalmic solution Place 1 drop into both eyes every evening.  07/23/17  Yes [provider]  losartan (COZAAR) 100 MG tablet TAKE 1 TABLET BY MOUTH DAILY. 02/02/20  Yes [name], MD  metFORMIN (GLUCOPHAGE) 500 MG tablet TAKE 1 TABLET BY MOUTH DAILY WITH BREAKFAST. 02/02/20  Yes [name], MD  Omega-3 Fatty Acids (FISH OIL) 1000 MG CAPS Take by mouth daily.    Yes [provider]  TRUE METRIX BLOOD GLUCOSE TEST test strip TEST ONE TIME DAILY 03/11/18  Yes [name], MD  TRUEPLUS LANCETS 33G MISC TEST ONE TIME DAILY 03/11/18  Yes [name], MD   Social History   Socioeconomic History  . Marital status: Married    Spouse name: Not on file  . Number of children: Not on file  . Years of education: Not on file  . Highest education level: Not on file  Occupational History  . Occupation: Educational psychologist  Tobacco Use  . Smoking status: Former Research scientist (life sciences)  . Smokeless tobacco: Never Used  Substance and Sexual Activity  . Alcohol use: No    Alcohol/week: 0.0 standard drinks  . Drug use: No  . Sexual activity: Never  Other Topics Concern  . Not on file  Social History Narrative   Married   Counsellor at [place] Strain: Not on file  Food Insecurity: Not on file  Transportation Needs: Not on file  Physical Activity: Not on file  Stress: Not on file  Social Connections: Not on file  Intimate Partner Violence: Not on file     Observations/Objective: Vitals:   04/25/20 1611  Height: 6' (1.829 m)  Coherent responses, speaking in full sentences without apparent respiratory distress on phone.  Cough a few times during discussion.  All questions were answered with understanding of plan expressed.  Assessment and Plan: Cough  COVID-19 virus infection  Hyponatremia  Transaminitis  COVID-19 infection with initial symptoms starting  December 27, some initial improvement and worsened since January 2nd as above.  Had persistent fatigue, some decreased appetite may be contributory.  Reports adequate hydration.  New/worsening cough past few days without fever or reported dyspnea/chest pain. Urgent care notes reviewed with slight hyponatremia, transaminitis may be related to infection.   -Continue fluids, nutrition supplement discussed such as Ensure or boost if needed with decreased appetite.  Continue Robitussin-DM or Mucinex DM for cough.  Based on recent worsening symptoms of cough did recommend respiratory clinic evaluation tomorrow afternoon.  Potentially could repeat blood work at that time with borderline abnormalities above, but at discretion of treating provider.  ER/urgent care precautions given prior to that time.  9:33 AM  04/26/20 Unable to be seen in respiratory clinic today.  Was scheduled for this upcoming Tuesday.  Spoke with patient, still with cough.  With new cough will start azithromycin for possible bacterial/secondary pneumonia.  Continue follow-up with respiratory clinic in 4 days, but urgent care/ER  precautions if any worsening prior to that time.  Understanding expressed  Follow Up Instructions:  Respiratory clinic eval tomorrow.  I discussed the assessment and treatment plan with the patient. The patient was provided an opportunity to ask questions and all were answered. The patient agreed with the plan and demonstrated an understanding of the instructions.   The patient was advised to call back or seek an in-person evaluation if the symptoms worsen or if the condition fails to improve as anticipated.  I provided 17 minutes of non-face-to-face time during this encounter.  Signed,   Merri Ray, MD [facility].  04/25/20

## 2020-04-25 NOTE — Patient Instructions (Signed)
° ° ° °  If you have lab work done today you will be contacted with your lab results within the next 2 weeks.  If you have not heard from us then please contact us. The fastest way to get your results is to register for My Chart. ° ° °IF you received an x-ray today, you will receive an invoice from [facility]. Please contact [facility] at [phone] with questions or concerns regarding your invoice.  ° °IF you received labwork today, you will receive an invoice from [facility]. Please contact [facility] at [phone] with questions or concerns regarding your invoice.  ° °Our billing staff will not be able to assist you with questions regarding bills from these companies. ° °You will be contacted with the lab results as soon as they are available. The fastest way to get your results is to activate your My Chart account. Instructions are located on the last page of this paperwork. If you have not heard from us regarding the results in 2 weeks, please contact this office. °  ° ° ° °

## 2020-04-26 MED ORDER — AZITHROMYCIN 250 MG PO TABS: ORAL_TABLET | ORAL | 0 refills | Status: DC

## 2020-04-26 NOTE — Addendum Note (Signed)
Addended by: Merri Ray R on: 04/26/2020 09:36 AM   Modules accepted: Orders

## 2020-04-30 ENCOUNTER — Ambulatory Visit: Payer: Medicare HMO

## 2020-05-01 ENCOUNTER — Ambulatory Visit (INDEPENDENT_AMBULATORY_CARE_PROVIDER_SITE_OTHER): Payer: Medicare HMO | Admitting: Nurse Practitioner

## 2020-05-01 ENCOUNTER — Ambulatory Visit (HOSPITAL_COMMUNITY)
Admission: RE | Admit: 2020-05-01 | Discharge: 2020-05-01 | Disposition: A | Discharge status: Home / Self Care | Payer: Medicare HMO | Source: Ambulatory Visit | Attending: Nurse Practitioner | Admitting: Nurse Practitioner

## 2020-05-01 ENCOUNTER — Other Ambulatory Visit: Payer: Self-pay

## 2020-05-01 VITALS — HR 82 | Temp 97.5°F

## 2020-05-01 DIAGNOSIS — J189 Pneumonia, unspecified organism: Secondary | ICD-10-CM | POA: Diagnosis not present

## 2020-05-01 DIAGNOSIS — R059 Cough, unspecified: Secondary | ICD-10-CM | POA: Insufficient documentation

## 2020-05-01 DIAGNOSIS — I700 Atherosclerosis of aorta: Secondary | ICD-10-CM | POA: Insufficient documentation

## 2020-05-01 DIAGNOSIS — Z8616 Personal history of COVID-19: Secondary | ICD-10-CM | POA: Insufficient documentation

## 2020-05-01 MED ORDER — PREDNISONE 20 MG PO TABS: 20.0000 mg | ORAL_TABLET | Freq: Every day | ORAL | 0 refills | Status: AC

## 2020-05-01 MED ORDER — PREDNISONE 20 MG PO TABS: 20.0000 mg | ORAL_TABLET | Freq: Every day | ORAL | 0 refills | Status: DC

## 2020-05-01 NOTE — Patient Instructions (Addendum)
Covid 19 Cough:  Appetite improving  Stay well hydrated  Stay active  Deep breathing exercises  May start vitamin C daily, vitamin D3 daily, Zinc 220 daily  May take tylenol or fever or pain  Will order chest x ray:  [facility] [address], [place] [address] [phone] MON - FRI 8:00 AM - 4:00 PM - WALK IN   Follow up:  Follow up in 2 weeks or sooner if needed

## 2020-05-01 NOTE — Progress Notes (Signed)
$'@Patient'u$  ID: Jeffery George, male    DOB: 23-Jun-1945, 75 y.o.   MRN: [id number]  Chief Complaint  Patient presents with  . Covid Positive    Referring provider: [name], MD     HPI  Patient presents today for post COVID care clinic visit.  Patient was diagnosed with Covid in December.  He states that he has been very weak and fatigued over the past few weeks.  He previously did not have an appetite was not eating much and laying around most of the day.  Over the past several days his appetite has returned and he has been eating more and staying well-hydrated.  He states that this has made a huge difference and he is feeling much improved at this point.  We discussed that we will send him for imaging concern for pneumonia. Denies f/c/s, n/v/d, hemoptysis, PND, chest pain or edema.       No Known Allergies  Immunization History  Administered Date(s) Administered  . Fluad Quad(high Dose 65+) 12/20/2019  . Influenza, High Dose Seasonal PF 01/18/2017, 12/10/2017  . Influenza,inj,Quad PF,6+ Mos 02/27/2013, 06/11/2014, 12/10/2014, 02/03/2016  . Influenza-Unspecified 01/18/2017, 01/11/2019  . Pneumococcal Conjugate-13 06/12/2015  . Pneumococcal Polysaccharide-23 05/01/2013  . Tdap 05/01/2013  . Zoster 05/09/2013    Past Medical History:  Diagnosis Date  . Arthritis   . Diabetes mellitus without complication ([place])   . Heart murmur   . Hypertension     Tobacco History: Social History   Tobacco Use  Smoking Status Former Smoker  Smokeless Tobacco Never Used   Counseling given: Yes   Outpatient Encounter Medications as of 05/01/2020  Medication Sig  . amoxicillin-clavulanate (AUGMENTIN) 875-125 MG tablet Take 1 tablet by mouth 2 (two) times daily for 7 days.  . [EXPIRED] predniSONE (DELTASONE) 20 MG tablet Take 1 tablet (20 mg total) by mouth daily with breakfast for 5 days.  . [DISCONTINUED] predniSONE (DELTASONE) 20 MG tablet Take 1 tablet (20 mg total) by  mouth daily with breakfast for 5 days.  Marland Kitchen ACCU-CHEK SOFTCLIX LANCETS lancets Test blood sugar once daily. Dx E11.9  . amLODipine (NORVASC) 5 MG tablet TAKE 1 TABLET EVERY DAY  . aspirin EC 81 MG tablet Take 1 tablet (81 mg total) by mouth daily.  Marland Kitchen atorvastatin (LIPITOR) 10 MG tablet TAKE 1 TABLET BY MOUTH DAILY.  Marland Kitchen azithromycin (ZITHROMAX) 250 MG tablet Take 2 pills by mouth on day 1, then 1 pill by mouth per day on days 2 through 5.  . blood glucose meter kit and supplies TrueMetrix, TruTrack, Accu-Chek Aviva Expert, Accu-Chek Aviva Plus, Accu-Chek Guide, Accu-Chek Nano, or Accu-Chek Smartview.   Use once per day.  . Blood Glucose Monitoring Suppl (BLOOD GLUCOSE MONITOR KIT) KIT Use to test blood sugar twice daily.  . colchicine (COLCRYS) 0.6 MG tablet Take 2 tabs po at symptom onset. Repeat 1 tab po 1 hour later.  . Lancets (ONETOUCH ULTRASOFT) lancets Use as instructed  . latanoprost (XALATAN) 0.005 % ophthalmic solution Place 1 drop into both eyes every evening.   Marland Kitchen losartan (COZAAR) 100 MG tablet TAKE 1 TABLET BY MOUTH DAILY.  . metFORMIN (GLUCOPHAGE) 500 MG tablet TAKE 1 TABLET BY MOUTH DAILY WITH BREAKFAST.  Marland Kitchen Omega-3 Fatty Acids (FISH OIL) 1000 MG CAPS Take by mouth daily.   . TRUE METRIX BLOOD GLUCOSE TEST test strip TEST ONE TIME DAILY  . TRUEPLUS LANCETS 33G MISC TEST ONE TIME DAILY   No facility-administered encounter medications on file  as of 05/01/2020.     Review of Systems  Review of Systems  Constitutional: Positive for appetite change and fatigue. Negative for fever.  HENT: Negative.   Respiratory: Positive for cough and shortness of breath.   Cardiovascular: Negative.  Negative for chest pain, palpitations and leg swelling.  Gastrointestinal: Negative.   Allergic/Immunologic: Negative.   Neurological: Positive for weakness.  Psychiatric/Behavioral: Negative.        Physical Exam  Pulse 82   Temp (!) 97.5 F (36.4 C)   SpO2 95%   Wt Readings from Last 5  Encounters:  01/05/20 243 lb (110.2 kg)  12/20/19 244 lb (110.7 kg)  06/19/19 251 lb (113.9 kg)  12/08/18 250 lb 9.6 oz (113.7 kg)  11/15/18 255 lb (115.7 kg)     Physical Exam Vitals and nursing note reviewed.  Constitutional:      General: He is not in acute distress.    Appearance: He is well-developed and well-nourished.  Cardiovascular:     Rate and Rhythm: Normal rate and regular rhythm.  Pulmonary:     Effort: Pulmonary effort is normal.     Breath sounds: Normal breath sounds.  Musculoskeletal:     Right lower leg: No edema.     Left lower leg: No edema.  Skin:    General: Skin is warm and dry.  Neurological:     Mental Status: He is alert and oriented to person, place, and time.  Psychiatric:        Mood and Affect: Mood and affect and mood normal.        Behavior: Behavior normal.       Imaging: DG Chest 2 View  Result Date: 05/02/2020 CLINICAL DATA:  Cough.  COVID-19 positive EXAM: CHEST - 2 VIEW COMPARISON:  December 23, 2017 FINDINGS: There is patchy ill-defined airspace opacity in each mid and lower lung region. No consolidation. Heart size and pulmonary vascularity normal. No adenopathy. There is aortic atherosclerosis. There is degenerative change in the thoracic spine. IMPRESSION: Patchy airspace opacity bilaterally, likely due to atypical organism multifocal pneumonia. No consolidation. Cardiac silhouette within normal limits. Aortic Atherosclerosis (ICD10-I70.0). These results will be called to the ordering clinician or representative by the Radiologist Assistant, and communication documented in the PACS or [employer]. Electronically Signed   By: Lowella Grip III M.D.   On: 05/02/2020 10:44     Assessment & Plan:   History of COVID-19 Cough:  Appetite improving  Stay well hydrated  Stay active  Deep breathing exercises  May start vitamin C daily, vitamin D3 daily, Zinc 220 daily  May take tylenol or fever or pain  Will order  chest x ray:  [facility] [address], [place] [address] [phone] MON - FRI 8:00 AM - 4:00 PM - WALK IN   Follow up:  Follow up in 2 weeks or sooner if needed      Fenton Foy, NP 05/08/2020

## 2020-05-02 ENCOUNTER — Telehealth: Payer: Self-pay | Admitting: Nurse Practitioner

## 2020-05-02 MED ORDER — AMOXICILLIN-POT CLAVULANATE 875-125 MG PO TABS: 1.0000 | ORAL_TABLET | Freq: Two times a day (BID) | ORAL | 0 refills | Status: AC

## 2020-05-02 NOTE — Telephone Encounter (Signed)
Jeffery George with [facility] called to report results. Confirmed final report/ results in chart. Forward to provider for review & contacting pt.

## 2020-05-08 DIAGNOSIS — R059 Cough, unspecified: Secondary | ICD-10-CM | POA: Insufficient documentation

## 2020-05-08 DIAGNOSIS — Z8616 Personal history of COVID-19: Secondary | ICD-10-CM | POA: Insufficient documentation

## 2020-05-08 NOTE — Telephone Encounter (Signed)
needs to reschedule

## 2020-05-08 NOTE — Assessment & Plan Note (Signed)
Cough:  Appetite improving  Stay well hydrated  Stay active  Deep breathing exercises  May start vitamin C daily, vitamin D3 daily, Zinc 220 daily  May take tylenol or fever or pain  Will order chest x ray:  [facility] [address], [place] [address] [phone] MON - FRI 8:00 AM - 4:00 PM - WALK IN   Follow up:  Follow up in 2 weeks or sooner if needed

## 2020-05-15 ENCOUNTER — Ambulatory Visit (INDEPENDENT_AMBULATORY_CARE_PROVIDER_SITE_OTHER): Payer: Medicare HMO | Admitting: Nurse Practitioner

## 2020-05-15 VITALS — BP 146/74 | HR 88 | Temp 98.1°F | Ht 72.0 in | Wt 237.0 lb

## 2020-05-15 DIAGNOSIS — Z8616 Personal history of COVID-19: Secondary | ICD-10-CM

## 2020-05-15 NOTE — Progress Notes (Signed)
$'@Patient'y$  ID: Jeffery George, male    DOB: December 15, 1945, 75 y.o.   MRN: [id number]  Chief Complaint  Patient presents with  . Follow-up    Feeling a lot better. No complaints     Referring provider: [name], MD   75 year old male with history of hypertension, diabetes, arthritis, heart murmur  HPI  Patient presents today for post COVID care clinic visit/follow-up.  Patient was last seen in our office on 05/01/2020.  Patient was prescribed a round of azithromycin by PCP chest x-ray ordered at last office visit showed pneumonia.  Patient states that he has improved over the past couple weeks.  He has regained his appetite and is eating better and staying well-hydrated.  He is trying to stay active. Denies f/c/s, n/v/d, hemoptysis, PND, chest pain or edema.      No Known Allergies  Immunization History  Administered Date(s) Administered  . Fluad Quad(high Dose 65+) 12/20/2019  . Influenza, High Dose Seasonal PF 01/18/2017, 12/10/2017  . Influenza,inj,Quad PF,6+ Mos 02/27/2013, 06/11/2014, 12/10/2014, 02/03/2016  . Influenza-Unspecified 01/18/2017, 01/11/2019  . Pneumococcal Conjugate-13 06/12/2015  . Pneumococcal Polysaccharide-23 05/01/2013  . Tdap 05/01/2013  . Zoster 05/09/2013    Past Medical History:  Diagnosis Date  . Arthritis   . Diabetes mellitus without complication ([place])   . Heart murmur   . Hypertension     Tobacco History: Social History   Tobacco Use  Smoking Status Former Smoker  Smokeless Tobacco Never Used   Counseling given: Not Answered   Outpatient Encounter Medications as of 05/15/2020  Medication Sig  . ACCU-CHEK SOFTCLIX LANCETS lancets Test blood sugar once daily. Dx E11.9  . amLODipine (NORVASC) 5 MG tablet TAKE 1 TABLET EVERY DAY  . aspirin EC 81 MG tablet Take 1 tablet (81 mg total) by mouth daily.  Marland Kitchen atorvastatin (LIPITOR) 10 MG tablet TAKE 1 TABLET BY MOUTH DAILY.  . blood glucose meter kit and supplies TrueMetrix, TruTrack,  Accu-Chek Aviva Expert, Accu-Chek Aviva Plus, Accu-Chek Guide, Accu-Chek Nano, or Accu-Chek Smartview.   Use once per day.  . Blood Glucose Monitoring Suppl (BLOOD GLUCOSE MONITOR KIT) KIT Use to test blood sugar twice daily.  . colchicine (COLCRYS) 0.6 MG tablet Take 2 tabs po at symptom onset. Repeat 1 tab po 1 hour later.  . Lancets (ONETOUCH ULTRASOFT) lancets Use as instructed  . latanoprost (XALATAN) 0.005 % ophthalmic solution Place 1 drop into both eyes every evening.   Marland Kitchen losartan (COZAAR) 100 MG tablet TAKE 1 TABLET BY MOUTH DAILY.  . metFORMIN (GLUCOPHAGE) 500 MG tablet TAKE 1 TABLET BY MOUTH DAILY WITH BREAKFAST.  Marland Kitchen Omega-3 Fatty Acids (FISH OIL) 1000 MG CAPS Take by mouth daily.   . TRUE METRIX BLOOD GLUCOSE TEST test strip TEST ONE TIME DAILY  . TRUEPLUS LANCETS 33G MISC TEST ONE TIME DAILY  . [DISCONTINUED] azithromycin (ZITHROMAX) 250 MG tablet Take 2 pills by mouth on day 1, then 1 pill by mouth per day on days 2 through 5.   No facility-administered encounter medications on file as of 05/15/2020.     Review of Systems  Review of Systems  Constitutional: Negative.  Negative for fatigue and fever.  HENT: Negative.   Respiratory: Negative for cough and shortness of breath.   Cardiovascular: Negative.  Negative for chest pain, palpitations and leg swelling.  Gastrointestinal: Negative.   Allergic/Immunologic: Negative.   Neurological: Negative.   Psychiatric/Behavioral: Negative.        Physical Exam  BP (!) 146/74 (  BP Location: Left Arm)   Pulse 88   Temp 98.1 F (36.7 C)   Ht 6' (1.829 m)   Wt 237 lb (107.5 kg)   SpO2 95%   BMI 32.14 kg/m   Wt Readings from Last 5 Encounters:  05/15/20 237 lb (107.5 kg)  01/05/20 243 lb (110.2 kg)  12/20/19 244 lb (110.7 kg)  06/19/19 251 lb (113.9 kg)  12/08/18 250 lb 9.6 oz (113.7 kg)     Physical Exam Vitals and nursing note reviewed.  Constitutional:      General: He is not in acute distress.    Appearance:  He is well-developed and well-nourished.  Cardiovascular:     Rate and Rhythm: Normal rate and regular rhythm.  Pulmonary:     Effort: Pulmonary effort is normal.     Breath sounds: Normal breath sounds.  Musculoskeletal:     Right lower leg: No edema.     Left lower leg: No edema.  Skin:    General: Skin is warm and dry.  Neurological:     Mental Status: He is alert and oriented to person, place, and time.  Psychiatric:        Mood and Affect: Mood and affect and mood normal.        Behavior: Behavior normal.      Imaging: DG Chest 2 View  Result Date: 05/02/2020 CLINICAL DATA:  Cough.  COVID-19 positive EXAM: CHEST - 2 VIEW COMPARISON:  December 23, 2017 FINDINGS: There is patchy ill-defined airspace opacity in each mid and lower lung region. No consolidation. Heart size and pulmonary vascularity normal. No adenopathy. There is aortic atherosclerosis. There is degenerative change in the thoracic spine. IMPRESSION: Patchy airspace opacity bilaterally, likely due to atypical organism multifocal pneumonia. No consolidation. Cardiac silhouette within normal limits. Aortic Atherosclerosis (ICD10-I70.0). These results will be called to the ordering clinician or representative by the Radiologist Assistant, and communication documented in the PACS or [employer]. Electronically Signed   By: Lowella Grip III M.D.   On: 05/02/2020 10:44     Assessment & Plan:   History of COVID-19 Stay well hydrated  Stay active  Deep breathing exercises  May take tylenol or fever or pain  May take mucinex twice daily if needed    Follow up:  Follow up in 2 weeks or sooner if needed - will need imaging      Fenton Foy, NP 05/15/2020

## 2020-05-15 NOTE — Patient Instructions (Signed)
Covid pneumonia:   Stay well hydrated  Stay active  Deep breathing exercises  May take tylenol or fever or pain  May take mucinex twice daily if needed    Follow up:  Follow up in 2 weeks or sooner if needed - will need imaging

## 2020-05-15 NOTE — Assessment & Plan Note (Signed)
Stay well hydrated  Stay active  Deep breathing exercises  May take tylenol or fever or pain  May take mucinex twice daily if needed    Follow up:  Follow up in 2 weeks or sooner if needed - will need imaging

## 2020-05-29 ENCOUNTER — Other Ambulatory Visit: Payer: Self-pay

## 2020-05-29 ENCOUNTER — Ambulatory Visit
Admission: RE | Admit: 2020-05-29 | Discharge: 2020-05-29 | Disposition: A | Discharge status: Home / Self Care | Payer: Medicare HMO | Source: Ambulatory Visit | Attending: Nurse Practitioner | Admitting: Nurse Practitioner

## 2020-05-29 ENCOUNTER — Ambulatory Visit (INDEPENDENT_AMBULATORY_CARE_PROVIDER_SITE_OTHER): Payer: Medicare HMO | Admitting: Nurse Practitioner

## 2020-05-29 VITALS — BP 140/70 | HR 88 | Temp 97.8°F | Ht 72.0 in | Wt 240.1 lb

## 2020-05-29 DIAGNOSIS — R5383 Other fatigue: Secondary | ICD-10-CM | POA: Diagnosis not present

## 2020-05-29 DIAGNOSIS — Z8616 Personal history of COVID-19: Secondary | ICD-10-CM

## 2020-05-29 NOTE — Progress Notes (Signed)
$'@Patient'B$  ID: Jeffery George, male    DOB: [birth year], 75 y.o.   MRN: [id number]  Chief Complaint  Patient presents with  . post covid f/u     90 % better and getting better every day     Referring provider: [name], MD   75 year old male with history of hypertension, diabetes, arthritis, heart murmur  HPI   Patient presents today for post COVID care clinic visit.  Patient was seen in our office on 05/15/2020.  Patient states that he is much improved.  He has 90% back to baseline.  He states that he is improving more more each day.  Patient is due for repeat imaging today.  He is try to stay active and stay well-hydrated.  No new issues or concerns today. Denies f/c/s, n/v/d, hemoptysis, PND, chest pain or edema.     No Known Allergies  Immunization History  Administered Date(s) Administered  . Fluad Quad(high Dose 65+) 12/20/2019  . Influenza, High Dose Seasonal PF 01/18/2017, 12/10/2017  . Influenza,inj,Quad PF,6+ Mos 02/27/2013, 06/11/2014, 12/10/2014, 02/03/2016  . Influenza-Unspecified 01/18/2017, 01/11/2019  . Pneumococcal Conjugate-13 06/12/2015  . Pneumococcal Polysaccharide-23 05/01/2013  . Tdap 05/01/2013  . Zoster 05/09/2013    Past Medical History:  Diagnosis Date  . Arthritis   . Diabetes mellitus without complication ([place])   . Heart murmur   . Hypertension     Tobacco History: Social History   Tobacco Use  Smoking Status Former Smoker  Smokeless Tobacco Never Used   Counseling given: Not Answered   Outpatient Encounter Medications as of 05/29/2020  Medication Sig  . ACCU-CHEK SOFTCLIX LANCETS lancets Test blood sugar once daily. Dx E11.9  . amLODipine (NORVASC) 5 MG tablet TAKE 1 TABLET EVERY DAY  . aspirin EC 81 MG tablet Take 1 tablet (81 mg total) by mouth daily.  Marland Kitchen atorvastatin (LIPITOR) 10 MG tablet TAKE 1 TABLET BY MOUTH DAILY.  . blood glucose meter kit and supplies TrueMetrix, TruTrack, Accu-Chek Aviva Expert, Accu-Chek Aviva  Plus, Accu-Chek Guide, Accu-Chek Nano, or Accu-Chek Smartview.   Use once per day.  . Blood Glucose Monitoring Suppl (BLOOD GLUCOSE MONITOR KIT) KIT Use to test blood sugar twice daily.  . colchicine (COLCRYS) 0.6 MG tablet Take 2 tabs po at symptom onset. Repeat 1 tab po 1 hour later.  . Lancets (ONETOUCH ULTRASOFT) lancets Use as instructed  . latanoprost (XALATAN) 0.005 % ophthalmic solution Place 1 drop into both eyes every evening.   Marland Kitchen losartan (COZAAR) 100 MG tablet TAKE 1 TABLET BY MOUTH DAILY.  . metFORMIN (GLUCOPHAGE) 500 MG tablet TAKE 1 TABLET BY MOUTH DAILY WITH BREAKFAST.  Marland Kitchen Omega-3 Fatty Acids (FISH OIL) 1000 MG CAPS Take by mouth daily.   . TRUE METRIX BLOOD GLUCOSE TEST test strip TEST ONE TIME DAILY  . TRUEPLUS LANCETS 33G MISC TEST ONE TIME DAILY   No facility-administered encounter medications on file as of 05/29/2020.     Review of Systems  Review of Systems  Constitutional: Negative.  Negative for fatigue and fever.  HENT: Negative.   Respiratory: Negative for cough and shortness of breath.   Cardiovascular: Negative.  Negative for chest pain, palpitations and leg swelling.  Gastrointestinal: Negative.   Allergic/Immunologic: Negative.   Neurological: Negative.   Psychiatric/Behavioral: Negative.        Physical Exam  BP 140/70   Pulse 88   Temp 97.8 F (36.6 C) (Temporal)   Ht 6' (1.829 m)   Wt 240 lb 1.9 oz (  108.9 kg)   SpO2 97%   BMI 32.57 kg/m   Wt Readings from Last 5 Encounters:  05/29/20 240 lb 1.9 oz (108.9 kg)  05/15/20 237 lb (107.5 kg)  01/05/20 243 lb (110.2 kg)  12/20/19 244 lb (110.7 kg)  06/19/19 251 lb (113.9 kg)     Physical Exam Vitals and nursing note reviewed.  Constitutional:      General: He is not in acute distress.    Appearance: He is well-developed and well-nourished.  Cardiovascular:     Rate and Rhythm: Normal rate and regular rhythm.  Pulmonary:     Effort: Pulmonary effort is normal.     Breath sounds:  Normal breath sounds.  Musculoskeletal:     Right lower leg: No edema.     Left lower leg: No edema.  Skin:    General: Skin is warm and dry.  Neurological:     Mental Status: He is alert and oriented to person, place, and time.  Psychiatric:        Mood and Affect: Mood and affect and mood normal.        Behavior: Behavior normal.      Imaging: DG Chest 2 View  Result Date: 05/31/2020 CLINICAL DATA:  COVID pneumonia follow-up, persistent fatigue and dyspnea EXAM: CHEST - 2 VIEW COMPARISON:  05/01/2020 chest radiograph. FINDINGS: Stable cardiomediastinal silhouette with normal heart size. No pneumothorax. No pleural effusion. Faint hazy patchy opacities in both lungs, improved. No new consolidative airspace disease. IMPRESSION: Improved faint hazy patchy opacities in both lungs, compatible with resolving COVID-19 pneumonia. If there is clinical concern for postinflammatory fibrosis, follow-up high-resolution chest CT study in 3-6 months may be obtained. Electronically Signed   By: Ilona Sorrel M.D.   On: 05/31/2020 11:01     Assessment & Plan:   History of COVID-19 Stay well hydrated  Stay active  Deep breathing exercises  Will order repeat chest x ray:  [facility] [address], [place] [address] [phone] MON - FRI 8:00 AM - 4:00 PM - WALK IN   Follow up:  Follow up if needed      Fenton Foy, NP 06/04/2020

## 2020-05-29 NOTE — Patient Instructions (Signed)
Covid pneumonia:   Stay well hydrated  Stay active  Deep breathing exercises  Will order repeat chest x ray:  [facility] [address], [place] [address] [phone] MON - FRI 8:00 AM - 4:00 PM - WALK IN   Follow up:  Follow up if needed

## 2020-06-04 NOTE — Assessment & Plan Note (Signed)
Stay well hydrated  Stay active  Deep breathing exercises  Will order repeat chest x ray:  [facility] [address], [place] [address] [phone] MON - FRI 8:00 AM - 4:00 PM - WALK IN   Follow up:  Follow up if needed

## 2020-07-01 DIAGNOSIS — E119 Type 2 diabetes mellitus without complications: Secondary | ICD-10-CM | POA: Diagnosis not present

## 2020-07-01 DIAGNOSIS — H401131 Primary open-angle glaucoma, bilateral, mild stage: Secondary | ICD-10-CM | POA: Diagnosis not present

## 2020-07-04 ENCOUNTER — Other Ambulatory Visit: Payer: Self-pay

## 2020-07-04 ENCOUNTER — Ambulatory Visit (INDEPENDENT_AMBULATORY_CARE_PROVIDER_SITE_OTHER): Payer: Medicare HMO | Admitting: Family Medicine

## 2020-07-04 ENCOUNTER — Encounter: Payer: Self-pay | Admitting: Family Medicine

## 2020-07-04 VITALS — BP 140/70 | HR 65 | Temp 98.2°F | Ht 72.0 in | Wt 244.0 lb

## 2020-07-04 DIAGNOSIS — E785 Hyperlipidemia, unspecified: Secondary | ICD-10-CM

## 2020-07-04 DIAGNOSIS — E1169 Type 2 diabetes mellitus with other specified complication: Secondary | ICD-10-CM

## 2020-07-04 DIAGNOSIS — R011 Cardiac murmur, unspecified: Secondary | ICD-10-CM | POA: Diagnosis not present

## 2020-07-04 DIAGNOSIS — E669 Obesity, unspecified: Secondary | ICD-10-CM

## 2020-07-04 DIAGNOSIS — R7401 Elevation of levels of liver transaminase levels: Secondary | ICD-10-CM

## 2020-07-04 DIAGNOSIS — I10 Essential (primary) hypertension: Secondary | ICD-10-CM

## 2020-07-04 DIAGNOSIS — Z8616 Personal history of COVID-19: Secondary | ICD-10-CM

## 2020-07-04 DIAGNOSIS — L84 Corns and callosities: Secondary | ICD-10-CM

## 2020-07-04 DIAGNOSIS — E119 Type 2 diabetes mellitus without complications: Secondary | ICD-10-CM | POA: Diagnosis not present

## 2020-07-04 DIAGNOSIS — M79672 Pain in left foot: Secondary | ICD-10-CM | POA: Diagnosis not present

## 2020-07-04 MED ORDER — AMLODIPINE BESYLATE 5 MG PO TABS: ORAL_TABLET | ORAL | 1 refills | Status: DC

## 2020-07-04 MED ORDER — METFORMIN HCL 500 MG PO TABS: 500.0000 mg | ORAL_TABLET | Freq: Every day | ORAL | 1 refills | Status: DC

## 2020-07-04 MED ORDER — LOSARTAN POTASSIUM 100 MG PO TABS: 100.0000 mg | ORAL_TABLET | Freq: Every day | ORAL | 1 refills | Status: DC

## 2020-07-04 MED ORDER — ATORVASTATIN CALCIUM 10 MG PO TABS
10.0000 mg | ORAL_TABLET | Freq: Every day | ORAL | 2 refills | Status: DC
Start: 2020-07-04 — End: 2021-02-06

## 2020-07-04 NOTE — Progress Notes (Signed)
Subjective:  Patient ID: Jeffery George, male    DOB: 1946/01/17  Age: 75 y.o. MRN: [id number]  CC:  Chief Complaint  Patient presents with  . Follow-up    On hypertension,hyperlipidemia, diabetes, and L foot pain. Pt reports still having issues with his L foot sonce last OV.Pt reports no issues with other chronic conditions. Pt reports watching his diet and taking his medication regularly. Pt reports no side effects from medication to report.    HPI Jeffery George presents for   Left foot pain: Discussed in September.  Possible verrucal lesion at the first MTP area for a few months, with various treatments for callus.  Thought to be possible callus secondary degenerative changes at the MTP.  Was referred to podiatry, but based on referral notes, multiple attempts to contact patient without success. About the same. Pad under area helps some.   No known recent gout flare, no daily meds.   Diabetes: Associated with obesity.  Body mass index is 33.09 kg/m. Treated with Metformin 500 mg daily, he is on ARB and statin. Microalbumin: Normal ratio 9021 Rare home readings - 118-120 usually. No lows.  Optho, foot exam, pneumovax: up to date.    Lab Results  Component Value Date   HGBA1C 6.2 (H) 12/20/2019   HGBA1C 6.0 (H) 06/19/2019   HGBA1C 6.0 (H) 12/08/2018   Lab Results  Component Value Date   MICROALBUR 0.6 12/10/2014   LDLCALC 56 12/20/2019   CREATININE 1.14 04/23/2020   Hypertension: Amlodipine 5 mg daily, losartan 100 mg daily.  No new side effects.  Home readings:none.  Wears compression stockings at times. Stable.  Hx of heart murmur - referred to cardiology in 2020.  BP Readings from Last 3 Encounters:  07/04/20 140/70  05/29/20 140/70  05/15/20 (!) 146/74   Lab Results  Component Value Date   CREATININE 1.14 04/23/2020   Hyperlipidemia: Lipitor 10 mg daily.  Slight elevated LFTs earlier this year, but had Covid infection. Prior normal.  Lab Results   Component Value Date   CHOL 117 12/20/2019   HDL 46 12/20/2019   LDLCALC 56 12/20/2019   TRIG 75 12/20/2019   CHOLHDL 2.5 12/20/2019   Lab Results  Component Value Date   ALT 81 (H) 04/23/2020   AST 60 (H) 04/23/2020   ALKPHOS 46 04/23/2020   BILITOT 1.4 (H) 04/23/2020   History of COVID-19 infection Covid pneumonia in January.  Had subsequent follow-up with post COVID care center, 90% improved at February 16 visit. Most recent chest x-ray February 18 with improved faint hazy patchy opacities in both lungs compatible with resolving COVID-19 pneumonia.  If clinical concern of postinflammatory fibrosis, follow-up chest CT in 3 to 6 months discussed. Almost as baseline now. No cough, dyspnea. No limitations, and working ok.     History Patient Active Problem List   Diagnosis Date Noted  . History of COVID-19 05/08/2020  . Cough 05/08/2020  . Rotator cuff syndrome, left 11/15/2018  . Chronic left shoulder pain 08/09/2018  . Left retinal detachment 08/11/2017  . Gout 02/27/2013  . Essential hypertension, benign 02/27/2013  . Type II or unspecified type diabetes mellitus without mention of complication, uncontrolled 02/27/2013   Past Medical History:  Diagnosis Date  . Arthritis   . Diabetes mellitus without complication ([place])   . Heart murmur   . Hypertension    Past Surgical History:  Procedure Laterality Date  . CATARACT EXTRACTION    . FRACTURE SURGERY  Lt ankle  . GAS/FLUID EXCHANGE Left 08/12/2017   Procedure: C3F8 GAS/FLUID EXCHANGE;  Surgeon: Bernarda Caffey, MD;  Location: [facility];  Service: Ophthalmology;  Laterality: Left;  . HERNIA REPAIR    . IRIDOTOMY / IRIDECTOMY Bilateral    stents  . VASECTOMY    . VITRECTOMY 25 GAUGE WITH SCLERAL BUCKLE Left 08/12/2017   Procedure: VITRECTOMY 25 GAUGE WITH SCLERAL BUCKLE WITH ENDOLASER;  Surgeon: Bernarda Caffey, MD;  Location: [facility];  Service: Ophthalmology;  Laterality: Left;   No Known Allergies Prior to Admission  medications   Medication Sig Start Date End Date Taking? Authorizing Provider  ACCU-CHEK SOFTCLIX LANCETS lancets Test blood sugar once daily. Dx E11.9 08/26/15  Yes [name], MD  amLODipine (NORVASC) 5 MG tablet TAKE 1 TABLET EVERY DAY 02/02/20  Yes [name], MD  aspirin EC 81 MG tablet Take 1 tablet (81 mg total) by mouth daily. 02/27/13  Yes Shawnee Knapp, MD  atorvastatin (LIPITOR) 10 MG tablet TAKE 1 TABLET BY MOUTH DAILY. 02/02/20  Yes [name], MD  blood glucose meter kit and supplies TrueMetrix, TruTrack, Accu-Chek Aviva Expert, Accu-Chek Aviva Plus, Accu-Chek Guide, Accu-Chek Nano, or Accu-Chek Smartview.   Use once per day. 07/31/17  Yes [name], MD  Blood Glucose Monitoring Suppl (BLOOD GLUCOSE MONITOR KIT) KIT Use to test blood sugar twice daily. 07/17/13  Yes [name], MD  colchicine (COLCRYS) 0.6 MG tablet Take 2 tabs po at symptom onset. Repeat 1 tab po 1 hour later. 06/22/17  Yes [name], MD  Lancets [facility] ULTRASOFT) lancets Use as instructed 06/05/13  Yes [name], MD  latanoprost (XALATAN) 0.005 % ophthalmic solution Place 1 drop into both eyes every evening.  07/23/17  Yes [provider]  losartan (COZAAR) 100 MG tablet TAKE 1 TABLET BY MOUTH DAILY. 02/02/20  Yes [name], MD  metFORMIN (GLUCOPHAGE) 500 MG tablet TAKE 1 TABLET BY MOUTH DAILY WITH BREAKFAST. 02/02/20  Yes [name], MD  Omega-3 Fatty Acids (FISH OIL) 1000 MG CAPS Take by mouth daily.    Yes [provider]  TRUE METRIX BLOOD GLUCOSE TEST test strip TEST ONE TIME DAILY 03/11/18  Yes [name], MD  TRUEPLUS LANCETS 33G MISC TEST ONE TIME DAILY 03/11/18  Yes [name], MD   Social History   Socioeconomic History  . Marital status: Married    Spouse name: Not on file  . Number of children: Not on file  . Years of education: Not on file  . Highest education level: Not on file  Occupational History   . Occupation: Educational psychologist  Tobacco Use  . Smoking status: Former Research scientist (life sciences)  . Smokeless tobacco: Never Used  Substance and Sexual Activity  . Alcohol use: No    Alcohol/week: 0.0 standard drinks  . Drug use: No  . Sexual activity: Never  Other Topics Concern  . Not on file  Social History Narrative   Married   Counsellor at [place] Strain: Not on file  Food Insecurity: Not on file  Transportation Needs: Not on file  Physical Activity: Not on file  Stress: Not on file  Social Connections: Not on file  Intimate Partner Violence: Not on file    Review of Systems  Constitutional: Negative for fatigue and unexpected weight change.  Eyes: Negative for visual disturbance.  Respiratory: Negative for cough,  chest tightness and shortness of breath.   Cardiovascular: Negative for chest pain, palpitations and leg swelling.  Gastrointestinal: Negative for abdominal pain and blood in stool.  Neurological: Negative for dizziness, light-headedness and headaches.     Objective:   Vitals:   07/04/20 0845 07/04/20 0848  BP: (!) 163/77 140/70  Pulse: 65   Temp: 98.2 F (36.8 C)   TempSrc: Temporal   SpO2: 97%   Weight: 244 lb (110.7 kg)   Height: 6' (1.829 m)      Physical Exam Vitals reviewed.  Constitutional:      Appearance: He is well-developed.  HENT:     Head: Normocephalic and atraumatic.  Eyes:     Pupils: Pupils are equal, round, and reactive to light.  Neck:     Vascular: No carotid bruit or JVD.  Cardiovascular:     Rate and Rhythm: Normal rate and regular rhythm.     Heart sounds: Murmur (1-6/5 systolic. ) heard.    Pulmonary:     Effort: Pulmonary effort is normal.     Breath sounds: Normal breath sounds. No rales.  Musculoskeletal:     Right lower leg: Edema (1+ at lower third bilateral, slight stasis changes, no ulcerations or wounds.) present.     Left lower leg: Edema present.      Comments: Left foot, callus at the plantar aspect under first MTP.  Skin:    General: Skin is warm and dry.  Neurological:     Mental Status: He is alert and oriented to person, place, and time.  Psychiatric:        Mood and Affect: Mood normal.        Behavior: Behavior normal.      Assessment & Plan:  Jeffery George is a 75 y.o. male . Type 2 diabetes mellitus with obesity ([place]) - Plan: Hemoglobin A1c Controlled type 2 diabetes mellitus without complication, without long-term current use of insulin ([place]) - Plan: metFORMIN (GLUCOPHAGE) 500 MG tablet  -Check A1c, no med changes for now.  Essential hypertension, benign - Plan: Lipid panel, Comprehensive metabolic panel, amLODipine (NORVASC) 5 MG tablet, losartan (COZAAR) 100 MG tablet  -  Stable, tolerating current regimen. Medications refilled. Labs pending as above.   Hyperlipidemia, unspecified hyperlipidemia type - Plan: Lipid panel, Comprehensive metabolic panel, atorvastatin (LIPITOR) 10 MG tablet  -Tolerating Lipitor, continue same.  Check labs  History of COVID-19 Transaminitis  -Repeat LFTs as above.  Has improved since COVID-19 infection/pneumonia.  Almost back to baseline, no limitations in activity at this time.  Denies any shortness of breath/chest pains.  We will follow-up in the next 3 months and at that time can decide on CT chest if needed but currently doing well.  Left foot pain Pre-ulcerative corn or callous  -Stable but persistent discomfort, importance of podiatry follow-up discussed.  Phone number provided.  Previously referred.  Heart murmur - Plan: Ambulatory referral to Cardiology  -Longstanding murmur, but unfortunately has not yet been seen by cardiology.  Anticipate he may need echocardiogram.  Has had some chronic pedal edema.  Agrees to be seen by cardiology this time.  New referral placed  Meds ordered this encounter  Medications  . amLODipine (NORVASC) 5 MG tablet    Sig: TAKE 1 TABLET EVERY  DAY    Dispense:  90 tablet    Refill:  1  . atorvastatin (LIPITOR) 10 MG tablet    Sig: Take 1 tablet (10 mg total) by mouth daily.  Dispense:  90 tablet    Refill:  2  . losartan (COZAAR) 100 MG tablet    Sig: Take 1 tablet (100 mg total) by mouth daily.    Dispense:  90 tablet    Refill:  1  . metFORMIN (GLUCOPHAGE) 500 MG tablet    Sig: Take 1 tablet (500 mg total) by mouth daily with breakfast.    Dispense:  90 tablet    Refill:  1   Patient Instructions    Recheck in 3 months to consider CT for lungs. Return to the clinic or go to the nearest emergency room if any of your symptoms worsen or new symptoms occur. Please call podiatry for appointment.  Let me know if they need new referral [address], [place], [place] [address] [phone]  I do hear the heart murmur again today and it may be a little bit louder than in the past.  It is important that you follow-up with cardiology and I have placed a new referral.  No med changes for now.  Recheck 3 months but please let me know if there are questions in the meantime.     If you have lab work done today you will be contacted with your lab results within the next 2 weeks.  If you have not heard from [place] then please contact us. The fastest way to get your results is to register for My Chart.   IF you received an x-ray today, you will receive an invoice from [facility]. Please contact [facility] at [phone] with questions or concerns regarding your invoice.   IF you received labwork today, you will receive an invoice from [facility]. Please contact [facility] at [phone] with questions or concerns regarding your invoice.   Our billing staff will not be able to assist you with questions regarding bills from these companies.  You will be contacted with the lab results as soon as they are available. The fastest way to get your results is to activate your My Chart account.  Instructions are located on the last page of this paperwork. If you have not heard from [place] regarding the results in 2 weeks, please contact this office.         Signed, Merri Ray, MD [facility]

## 2020-07-04 NOTE — Patient Instructions (Addendum)
  Recheck in 3 months to consider CT for lungs. Return to the clinic or go to the nearest emergency room if any of your symptoms worsen or new symptoms occur. Please call podiatry for appointment.  Let me know if they need new referral [address], [place], [place] [address] [phone]  I do hear the heart murmur again today and it may be a little bit louder than in the past.  It is important that you follow-up with cardiology and I have placed a new referral.  No med changes for now.  Recheck 3 months but please let me know if there are questions in the meantime.     If you have lab work done today you will be contacted with your lab results within the next 2 weeks.  If you have not heard from [place] then please contact us. The fastest way to get your results is to register for My Chart.   IF you received an x-ray today, you will receive an invoice from [facility]. Please contact [facility] at [phone] with questions or concerns regarding your invoice.   IF you received labwork today, you will receive an invoice from [facility]. Please contact [facility] at [phone] with questions or concerns regarding your invoice.   Our billing staff will not be able to assist you with questions regarding bills from these companies.  You will be contacted with the lab results as soon as they are available. The fastest way to get your results is to activate your My Chart account. Instructions are located on the last page of this paperwork. If you have not heard from [place] regarding the results in 2 weeks, please contact this office.

## 2020-07-11 DIAGNOSIS — E669 Obesity, unspecified: Secondary | ICD-10-CM | POA: Diagnosis not present

## 2020-07-11 DIAGNOSIS — E1169 Type 2 diabetes mellitus with other specified complication: Secondary | ICD-10-CM | POA: Diagnosis not present

## 2020-07-11 DIAGNOSIS — I10 Essential (primary) hypertension: Secondary | ICD-10-CM | POA: Diagnosis not present

## 2020-07-11 DIAGNOSIS — E785 Hyperlipidemia, unspecified: Secondary | ICD-10-CM | POA: Diagnosis not present

## 2020-07-11 LAB — COMPREHENSIVE METABOLIC PANEL
ALT: 22 IU/L (ref 0–44)
AST: 20 IU/L (ref 0–40)
Albumin/Globulin Ratio: 1.9 (ref 1.2–2.2)
Albumin: 4.6 g/dL (ref 3.7–4.7)
Alkaline Phosphatase: 46 IU/L (ref 44–121)
BUN/Creatinine Ratio: 23 (ref 10–24)
BUN: 21 mg/dL (ref 8–27)
Bilirubin Total: 0.6 mg/dL (ref 0.0–1.2)
CO2: 21 mmol/L (ref 20–29)
Calcium: 9.6 mg/dL (ref 8.6–10.2)
Chloride: 104 mmol/L (ref 96–106)
Creatinine, Ser: 0.93 mg/dL (ref 0.76–1.27)
Globulin, Total: 2.4 g/dL (ref 1.5–4.5)
Glucose: 127 mg/dL — ABNORMAL HIGH (ref 65–99)
Potassium: 4.4 mmol/L (ref 3.5–5.2)
Sodium: 142 mmol/L (ref 134–144)
Total Protein: 7 g/dL (ref 6.0–8.5)
eGFR: 87 mL/min/{1.73_m2} (ref >59)

## 2020-07-11 LAB — LIPID PANEL
Chol/HDL Ratio: 2.4 ratio (ref 0.0–5.0)
Cholesterol, Total: 120 mg/dL (ref 100–199)
HDL: 49 mg/dL (ref >39)
LDL Chol Calc (NIH): 56 mg/dL (ref 0–99)
Triglycerides: 76 mg/dL (ref 0–149)
VLDL Cholesterol Cal: 15 mg/dL (ref 5–40)

## 2020-07-11 LAB — HEMOGLOBIN A1C
Est. average glucose Bld gHb Est-mCnc: 128 mg/dL
Hgb A1c MFr Bld: 6.1 % — ABNORMAL HIGH (ref 4.8–5.6)

## 2020-07-30 ENCOUNTER — Encounter: Payer: Self-pay | Admitting: Cardiology

## 2020-07-30 ENCOUNTER — Ambulatory Visit: Payer: Medicare HMO | Admitting: Cardiology

## 2020-07-30 ENCOUNTER — Other Ambulatory Visit: Payer: Self-pay

## 2020-07-30 VITALS — BP 140/80 | HR 75 | Ht 72.0 in | Wt 245.0 lb

## 2020-07-30 DIAGNOSIS — R011 Cardiac murmur, unspecified: Secondary | ICD-10-CM

## 2020-07-30 DIAGNOSIS — I10 Essential (primary) hypertension: Secondary | ICD-10-CM

## 2020-07-30 NOTE — Patient Instructions (Addendum)
Medication Instructions:  Your physician recommends that you continue on your current medications as directed. Please refer to the Current Medication list given to you today. *If you need a refill on your cardiac medications before your next appointment, please call your pharmacy*  Lab Work: None ordered. If you have labs (blood work) drawn today and your tests are completely normal, you will receive your results only by: Marland Kitchen MyChart Message (if you have MyChart) OR . A paper copy in the mail If you have any lab test that is abnormal or we need to change your treatment, we will call you to review the results.  Testing/Procedures: Your physician has requested that you have an echocardiogram. Echocardiography is a painless test that uses sound waves to create images of your heart. It provides your doctor with information about the size and shape of your heart and how well your heart's chambers and valves are working. This procedure takes approximately one hour. There are no restrictions for this procedure.  Please schedule for ECHO  Follow-Up: At [facility], you and your health needs are our priority.  As part of our continuing mission to provide you with exceptional heart care, we have created designated Provider Care Teams.  These Care Teams include your primary Cardiologist (physician) and Advanced Practice Providers (APPs -  Physician Assistants and Nurse Practitioners) who all work together to provide you with the care you need, when you need it.  Your next appointment:   Your physician wants you to follow-up in: 4 week virtual visit (telephone) with Dr. Quentin Ore (post ECHO)

## 2020-07-30 NOTE — Progress Notes (Signed)
Electrophysiology Office Note:    Date:  07/30/2020   ID:  Jeffery George 1945/06/24, MRN [id number]  PCP:  [name], MD  [place] Cardiologist:  No primary care provider on file.  [facility] Electrophysiologist:  None   Referring MD: [name], MD   Chief Complaint: Heart murmur  History of Present Illness:    Jeffery George is a 75 y.o. male who presents for an evaluation of heart murmur at the request of Dr. Nyoka Cowden. Their medical history includes hypertension.  He tells me that he was first diagnosed with a heart murmur at the age of 75.  For many years people have just followed his murmur intermittently.  He has been asymptomatic.  He has never had an echocardiogram.  He is on antihypertensive medications but does not check his blood pressures at home.  He tells me that he does not experience exertional chest pain.  No syncope or presyncope.  Recently he has started to develop lower extremity edema with left greater than right.  No other symptoms of heart failure.  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  Past Medical History:  Diagnosis Date  . Arthritis   . Diabetes mellitus without complication ([place])   . Heart murmur   . Hypertension     Past Surgical History:  Procedure Laterality Date  . CATARACT EXTRACTION    . FRACTURE SURGERY     Lt ankle  . GAS/FLUID EXCHANGE Left 08/12/2017   Procedure: C3F8 GAS/FLUID EXCHANGE;  Surgeon: Bernarda Caffey, MD;  Location: [facility];  Service: Ophthalmology;  Laterality: Left;  . HERNIA REPAIR    . IRIDOTOMY / IRIDECTOMY Bilateral    stents  . VASECTOMY    . VITRECTOMY 25 GAUGE WITH SCLERAL BUCKLE Left 08/12/2017   Procedure: VITRECTOMY 25 GAUGE WITH SCLERAL BUCKLE WITH ENDOLASER;  Surgeon: Bernarda Caffey, MD;  Location: [facility];  Service: Ophthalmology;  Laterality: Left;    Current Medications: Current Meds  Medication Sig  . ACCU-CHEK SOFTCLIX LANCETS lancets Test blood sugar once daily. Dx E11.9   . amLODipine (NORVASC) 5 MG tablet TAKE 1 TABLET EVERY DAY  . aspirin EC 81 MG tablet Take 1 tablet (81 mg total) by mouth daily.  Marland Kitchen atorvastatin (LIPITOR) 10 MG tablet Take 1 tablet (10 mg total) by mouth daily.  . blood glucose meter kit and supplies TrueMetrix, TruTrack, Accu-Chek Aviva Expert, Accu-Chek Aviva Plus, Accu-Chek Guide, Accu-Chek Nano, or Accu-Chek Smartview.   Use once per day.  . Blood Glucose Monitoring Suppl (BLOOD GLUCOSE MONITOR KIT) KIT Use to test blood sugar twice daily.  . colchicine (COLCRYS) 0.6 MG tablet Take 2 tabs po at symptom onset. Repeat 1 tab po 1 hour later.  . Lancets (ONETOUCH ULTRASOFT) lancets Use as instructed  . latanoprost (XALATAN) 0.005 % ophthalmic solution Place 1 drop into both eyes every evening.   Marland Kitchen losartan (COZAAR) 100 MG tablet Take 1 tablet (100 mg total) by mouth daily.  . metFORMIN (GLUCOPHAGE) 500 MG tablet Take 1 tablet (500 mg total) by mouth daily with breakfast.  . Omega-3 Fatty Acids (FISH OIL) 1000 MG CAPS Take by mouth daily.   . TRUE METRIX BLOOD GLUCOSE TEST test strip TEST ONE TIME DAILY  . TRUEPLUS LANCETS 33G MISC TEST ONE TIME DAILY     Allergies:   Patient has no known allergies.   Social History   Socioeconomic History  . Marital status: Married    Spouse  name: Not on file  . Number of children: Not on file  . Years of education: Not on file  . Highest education level: Not on file  Occupational History  . Occupation: Educational psychologist  Tobacco Use  . Smoking status: Former Research scientist (life sciences)  . Smokeless tobacco: Never Used  Substance and Sexual Activity  . Alcohol use: No    Alcohol/week: 0.0 standard drinks  . Drug use: No  . Sexual activity: Never  Other Topics Concern  . Not on file  Social History Narrative   Married   Counsellor at [employer] Strain: Not on file  Food Insecurity: Not on file  Transportation Needs: Not on file  Physical  Activity: Not on file  Stress: Not on file  Social Connections: Not on file     Family History: The patient's family history includes Cancer in his mother; Diabetes in his brother, brother, and mother; Heart disease in his brother and father; Stroke in his father and mother. There is no history of Colon cancer.  ROS:   Please see the history of present illness.    All other systems reviewed and are negative.  EKGs/Labs/Other Studies Reviewed:    The following studies were reviewed today:   EKG:  The ekg ordered today demonstrates sinus rhythm  Recent Labs: 04/23/2020: Hemoglobin 14.0; Platelets 202 07/11/2020: ALT 22; BUN 21; Creatinine, Ser 0.93; Potassium 4.4; Sodium 142  Recent Lipid Panel    Component Value Date/Time   CHOL 120 07/11/2020 0835   TRIG 76 07/11/2020 0835   HDL 49 07/11/2020 0835   CHOLHDL 2.4 07/11/2020 0835   CHOLHDL 2.4 11/25/2015 1131   VLDL 26 11/25/2015 1131   LDLCALC 56 07/11/2020 0835    Physical Exam:    VS:  BP 140/80 (BP Location: Left Arm, Patient Position: Sitting, Cuff Size: Normal)   Pulse 75   Ht 6' (1.829 m)   Wt 245 lb (111.1 kg)   SpO2 95%   BMI 33.23 kg/m     Wt Readings from Last 3 Encounters:  07/30/20 245 lb (111.1 kg)  07/04/20 244 lb (110.7 kg)  05/29/20 240 lb 1.9 oz (108.9 kg)     GEN:  Well nourished, well developed in no acute distress HEENT: Normal NECK: No JVD; No carotid bruits LYMPHATICS: No lymphadenopathy CARDIAC: RRR, no rubs.  No gallops.  3/6 crescendo decrescendo systolic ejection murmur loudest at the upper sternal borders and radiating to the right greater than left carotid arteries.  Lower extremities are warm.  Left greater than right pitting edema.  The right has 1+.  Left has 2+ although this is an area that has a history of significant orthopedic trauma with an open fracture to the ankle. RESPIRATORY:  Clear to auscultation without rales, wheezing or rhonchi  ABDOMEN: Soft, non-tender,  non-distended MUSCULOSKELETAL: No deformity SKIN: Warm and dry NEUROLOGIC:  Alert and oriented x 3 PSYCHIATRIC:  Normal affect   ASSESSMENT:    1. Systolic murmur   2. Primary hypertension    PLAN:    In order of problems listed above:  1. Systolic murmur I suspect he has aortic valve stenosis, likely related to a bicuspid aortic valve given that this was first detected when he was 75 years old.  For years he was relatively asymptomatic but recently has developed some lower extremity edema which could be a sign of more critical stenosis.  I would like to  start with an echocardiogram to assess his valvular function.  We will plan to get this in the next 1 to 2 weeks and then I will see him via a virtual visit in 4 weeks.  2.  Hypertension Controlled.  Continue losartan and amlodipine.  Follow-up 4 weeks.  Virtual okay.  Medication Adjustments/Labs and Tests Ordered: Current medicines are reviewed at length with the patient today.  Concerns regarding medicines are outlined above.  Orders Placed This Encounter  Procedures  . EKG 12-Lead  . ECHOCARDIOGRAM COMPLETE   No orders of the defined types were placed in this encounter.    Signed, Jeffery George. Quentin Ore, MD, [facility], [facility] 07/30/2020 2:24 PM    Electrophysiology [facility]

## 2020-08-15 NOTE — Progress Notes (Signed)
Cardiology Office Note Date:  08/16/2020  Patient ID:  Jeffery George, Jeffery George Feb 21, 1946, MRN [id number] PCP:  [name], MD  Electrophysiologist: Dr. Quentin Ore    Chief Complaint: hands/face numbness  History of Present Illness: Jeffery George is a 75 y.o. male with history of HTN, HLD, DM, heart murmur  Had COVID Jan 2022  He comes today to be seen for Dr. Quentin Ore, consulted/last seen by him only recently 07/30/20 for evaluation of a heart murmur reported as essentially life long by the patient. Dr. Quentin Ore suspected AS by exam and planned for an echo and planned virtual follow up visit to discuss results.  He sent a my chart message yesterday c/o b/l hand as well as facial numbness and inability to grasp things with his hands as well and given an appt to come in..  Echo is scheduled for 08/29/20.  TODAY The week after his visit here he started having some episodes of L arm numbness, "like I slept on it" and his L hand was clumsy, eventually some facial numbness, felt like his lips were numb and someone asked if her was OK because his speech was funny. This lasted about an hour, he thought about calling EMS though was resolving. He has had another 4 episodes though not quite as strong or long lasting, perhaps about 10-12 minutes induration the 1st episode lasted nearly an hour.  The last was a couople days ago and reports full recovery each time.  No CP, palpitations or cardiac awareness of any kind with these episodes or otherwise. Since his covid illness in January has not felt like his breathing ever got back to normal No over SOB mild DOE. No cough  No dizzy spells, near syncope or syncope.  Noted LE swelling a few months, unchanged, L>R  Past Medical History:  Diagnosis Date  . Arthritis   . Diabetes mellitus without complication ([place])   . Heart murmur   . Hypertension     Past Surgical History:  Procedure Laterality Date  . CATARACT EXTRACTION    . FRACTURE  SURGERY     Lt ankle  . GAS/FLUID EXCHANGE Left 08/12/2017   Procedure: C3F8 GAS/FLUID EXCHANGE;  Surgeon: Bernarda Caffey, MD;  Location: [facility];  Service: Ophthalmology;  Laterality: Left;  . HERNIA REPAIR    . IRIDOTOMY / IRIDECTOMY Bilateral    stents  . VASECTOMY    . VITRECTOMY 25 GAUGE WITH SCLERAL BUCKLE Left 08/12/2017   Procedure: VITRECTOMY 25 GAUGE WITH SCLERAL BUCKLE WITH ENDOLASER;  Surgeon: Bernarda Caffey, MD;  Location: [facility];  Service: Ophthalmology;  Laterality: Left;    Current Outpatient Medications  Medication Sig Dispense Refill  . amLODipine (NORVASC) 5 MG tablet TAKE 1 TABLET EVERY DAY 90 tablet 1  . aspirin EC 81 MG tablet Take 1 tablet (81 mg total) by mouth daily.    Marland Kitchen atorvastatin (LIPITOR) 10 MG tablet Take 1 tablet (10 mg total) by mouth daily. 90 tablet 2  . latanoprost (XALATAN) 0.005 % ophthalmic solution Place 1 drop into both eyes every evening.     Marland Kitchen losartan (COZAAR) 100 MG tablet Take 1 tablet (100 mg total) by mouth daily. 90 tablet 1  . metFORMIN (GLUCOPHAGE) 500 MG tablet Take 1 tablet (500 mg total) by mouth daily with breakfast. 90 tablet 1  . Omega-3 Fatty Acids (FISH OIL) 1000 MG CAPS Take by mouth daily.     Marland Kitchen ACCU-CHEK SOFTCLIX LANCETS lancets Test blood sugar once daily. Dx  E11.9 100 each 2  . blood glucose meter kit and supplies TrueMetrix, TruTrack, Accu-Chek Aviva Expert, Accu-Chek Aviva Plus, Accu-Chek Guide, Accu-Chek Nano, or Accu-Chek Smartview.   Use once per day. 1 each 0  . Blood Glucose Monitoring Suppl (BLOOD GLUCOSE MONITOR KIT) KIT Use to test blood sugar twice daily. 1 each 0  . colchicine (COLCRYS) 0.6 MG tablet Take 2 tabs po at symptom onset. Repeat 1 tab po 1 hour later. (Patient not taking: Reported on 08/16/2020) 12 tablet 0  . Lancets (ONETOUCH ULTRASOFT) lancets Use as instructed 100 each 12  . TRUE METRIX BLOOD GLUCOSE TEST test strip TEST ONE TIME DAILY 100 each 1  . TRUEPLUS LANCETS 33G MISC TEST ONE TIME DAILY 100 each 0    No current facility-administered medications for this visit.    Allergies:   Patient has no known allergies.   Social History:  The patient  reports that he has quit smoking. He has never used smokeless tobacco. He reports that he does not drink alcohol and does not use drugs.   Family History:  The patient's family history includes Cancer in his mother; Diabetes in his brother, brother, and mother; Heart disease in his brother and father; Stroke in his father and mother.  ROS:  Please see the history of present illness.    All other systems are reviewed and otherwise negative.   PHYSICAL EXAM:  VS:  BP (!) 148/82   Pulse 68   Ht 6' (1.829 m)   Wt 244 lb (110.7 kg)   BMI 33.09 kg/m  BMI: Body mass index is 33.09 kg/m. Well nourished, well developed, in no acute distress HEENT: normocephalic, atraumatic Neck: no JVD, bruits on left, no masses Cardiac:  RRR; loud/harsh holosystolic murmur, radiates to L neck, no rubs, or gallops Lungs:  CTA b/l, no wheezing, rhonchi or rales Abd: soft, nontender MS: no deformity or atrophy Ext: trace-1+ edema Skin: warm and dry, no rash Neuro:  No gross deficits appreciated, has equal strength b/l without any clear deficits today Psych: euthymic mood, full affect   EKG:  Done today and reviewed by myself shows  SR 66bpm, no changes  No other historical cardiac data  Recent Labs: 04/23/2020: Hemoglobin 14.0; Platelets 202 07/11/2020: ALT 22; BUN 21; Creatinine, Ser 0.93; Potassium 4.4; Sodium 142  07/11/2020: Chol/HDL Ratio 2.4; Cholesterol, Total 120; HDL 49; LDL Chol Calc (NIH) 56; Triglycerides 76   CrCl cannot be calculated (Patient's most recent lab result is older than the maximum 21 days allowed.).   Wt Readings from Last 3 Encounters:  08/16/20 244 lb (110.7 kg)  07/30/20 245 lb (111.1 kg)  07/04/20 244 lb (110.7 kg)     Other studies reviewed: Additional studies/records reviewed today include: summarized above  ASSESSMENT  AND PLAN:  1. Life long heart murmur     Exam suggests AS      2. HTN     No changes with new symptoms and perhaps significant AS    3. Recurrent and transient L arm numbness/weakness and facial numbness     Discussed with DOD     To ER if any recurrence     Call PMD today to report symptoms and get w/u for likely TIAs     He has no hx of arrhythmias, no cardiac awareness with his symptoms, no need to pursue any further cardiac eval outside of the echo  Discussed above with the patient and importanc of seeking attention emergently if recurrent symptoms  Will move up his echo  Disposition: F/u with Dr. Quentin Ore as scheduled for now.  Current medicines are reviewed at length with the patient today.  The patient did not have any concerns regarding medicines.  Venetia Night, PA-C 08/16/2020 9:51 AM     [facility] [place] [address] [phone] (office)  [phone] (fax)

## 2020-08-16 ENCOUNTER — Ambulatory Visit: Payer: Medicare HMO | Admitting: Physician Assistant

## 2020-08-16 ENCOUNTER — Encounter: Payer: Self-pay | Admitting: Physician Assistant

## 2020-08-16 ENCOUNTER — Encounter: Payer: Self-pay | Admitting: Family Medicine

## 2020-08-16 ENCOUNTER — Other Ambulatory Visit: Payer: Self-pay

## 2020-08-16 VITALS — BP 148/82 | HR 68 | Ht 72.0 in | Wt 244.0 lb

## 2020-08-16 DIAGNOSIS — R200 Anesthesia of skin: Secondary | ICD-10-CM | POA: Diagnosis not present

## 2020-08-16 DIAGNOSIS — R202 Paresthesia of skin: Secondary | ICD-10-CM

## 2020-08-16 DIAGNOSIS — I10 Essential (primary) hypertension: Secondary | ICD-10-CM | POA: Diagnosis not present

## 2020-08-16 DIAGNOSIS — R011 Cardiac murmur, unspecified: Secondary | ICD-10-CM | POA: Diagnosis not present

## 2020-08-16 NOTE — Patient Instructions (Signed)
Medication Instructions:   Your physician recommends that you continue on your current medications as directed. Please refer to the Current Medication list given to you today.  *If you need a refill on your cardiac medications before your next appointment, please call your pharmacy*   Lab Work: [place]   If you have labs (blood work) drawn today and your tests are completely normal, you will receive your results only by: Marland Kitchen MyChart Message (if you have MyChart) OR . A paper copy in the mail If you have any lab test that is abnormal or we need to change your treatment, we will call you to review the results.   Testing/Procedures:  SCHEDULE EARLIER THAN 5-19 Your physician has requested that you have an echocardiogram. Echocardiography is a painless test that uses sound waves to create images of your heart. It provides your doctor with information about the size and shape of your heart and how well your heart's chambers and valves are working. This procedure takes approximately one hour. There are no restrictions for this procedure.   Follow-Up: At [facility], you and your health needs are our priority.  As part of our continuing mission to provide you with exceptional heart care, we have created designated Provider Care Teams.  These Care Teams include your primary Cardiologist (physician) and Advanced Practice Providers (APPs -  Physician Assistants and Nurse Practitioners) who all work together to provide you with the care you need, when you need it.  We recommend signing up for the patient portal called "MyChart".  Sign up information is provided on this After Visit Summary.  MyChart is used to connect with patients for Virtual Visits (Telemedicine).  Patients are able to view lab/test results, encounter notes, upcoming appointments, etc.  Non-urgent messages can be sent to your provider as well.   To learn more about what you can do with MyChart, go to  [email or web address].    Your next appointment:  AS SCHEDULED  The format for your next appointment:   Virtual Visit   Provider:   Lars Mage, MD   Other Instructions  [place]

## 2020-08-16 NOTE — Telephone Encounter (Signed)
FYI on pt.   Numbness and tingling face and hands advised by cards be seen ED if recur and to inform you of these episodes.   Any action to take? Last OV 07/04/2020

## 2020-08-19 NOTE — Telephone Encounter (Signed)
Schedule in person visit with me this week. Agree with immediate ER evaluation if any recurrence.

## 2020-08-21 ENCOUNTER — Ambulatory Visit (HOSPITAL_COMMUNITY): Payer: Medicare HMO | Attending: Cardiology

## 2020-08-21 ENCOUNTER — Other Ambulatory Visit: Payer: Self-pay

## 2020-08-21 DIAGNOSIS — R011 Cardiac murmur, unspecified: Secondary | ICD-10-CM | POA: Diagnosis not present

## 2020-08-21 LAB — ECHOCARDIOGRAM COMPLETE
AR max vel: 2.49 cm2
AV Area VTI: 2.58 cm2
AV Area mean vel: 2.23 cm2
AV Mean grad: 13 mmHg
AV Peak grad: 24.4 mmHg
Ao pk vel: 2.47 m/s
Area-P 1/2: 2.64 cm2
S' Lateral: 2.4 cm

## 2020-08-23 ENCOUNTER — Telehealth: Payer: Self-pay | Admitting: Cardiology

## 2020-08-23 NOTE — Telephone Encounter (Signed)
Sent mychart message advising this nurse did not call pt.  Await further needs.

## 2020-08-23 NOTE — Telephone Encounter (Signed)
Pt is returning call, he is not sure why he was called today. Please advise

## 2020-08-29 ENCOUNTER — Encounter: Payer: Self-pay | Admitting: Family Medicine

## 2020-08-29 ENCOUNTER — Other Ambulatory Visit: Payer: Self-pay

## 2020-08-29 ENCOUNTER — Other Ambulatory Visit (HOSPITAL_COMMUNITY): Payer: Medicare HMO

## 2020-08-29 ENCOUNTER — Ambulatory Visit (INDEPENDENT_AMBULATORY_CARE_PROVIDER_SITE_OTHER): Payer: Medicare HMO | Admitting: Family Medicine

## 2020-08-29 VITALS — BP 132/74 | HR 86 | Temp 98.0°F | Resp 16 | Ht 72.0 in | Wt 248.4 lb

## 2020-08-29 DIAGNOSIS — R202 Paresthesia of skin: Secondary | ICD-10-CM | POA: Diagnosis not present

## 2020-08-29 DIAGNOSIS — G459 Transient cerebral ischemic attack, unspecified: Secondary | ICD-10-CM

## 2020-08-29 DIAGNOSIS — R200 Anesthesia of skin: Secondary | ICD-10-CM | POA: Diagnosis not present

## 2020-08-29 DIAGNOSIS — R29898 Other symptoms and signs involving the musculoskeletal system: Secondary | ICD-10-CM

## 2020-08-29 MED ORDER — CLOPIDOGREL BISULFATE 75 MG PO TABS: 75.0000 mg | ORAL_TABLET | Freq: Every day | ORAL | 0 refills | Status: DC

## 2020-08-29 NOTE — Patient Instructions (Signed)
Continue aspirin, and I will discuss further workup with neurology.  If any return of arm/face numbness or weakness call 911 to be seen in emergency room.   Return to the clinic or go to the nearest emergency room if any of your symptoms worsen or new symptoms occur.   Transient Ischemic Attack A transient ischemic attack (TIA) causes stroke-like symptoms that go away quickly. Having a TIA means that a person is at higher risk for a stroke. A TIA happens when blood supply to the brain is blocked temporarily. A TIA is a medical emergency. What are the causes? This condition is caused by a temporary blockage in an artery in the head or neck. This means the brain does not get the blood supply it needs. There is no permanent brain damage with a TIA. A blockage can be caused by:  Fatty buildup in an artery in the head or neck (atherosclerosis).  A blood clot.  An artery tear (dissection).  Inflammation of an artery (vasculitis). Sometimes the cause is not known. What increases the risk? Certain factors may make you more likely to develop this condition. Some of these are things that you can change, such as:  Obesity.  Using products that contain nicotine or tobacco.  Taking oral birth control, especially if you also use tobacco.  Not being active.  Heavy alcohol use.  Drug use, especially cocaine and methamphetamine. Medical conditions that may increase your risk include:  High blood pressure (hypertension).  High cholesterol.  Diabetes.  Heart disease (coronary artery disease).  An irregular heartbeat, also called atrial fibrillation (AFib).  Sickle cell disease.  Sleep problems (sleep apnea).  Chronic inflammatory diseases, such as rheumatoid arthritis or lupus.  Blood clotting disorders (hypercoagulable state). Other risk factors include:  Being over the age of 20.  Being male.  Family history of stroke.  Previous history of blood clots, stroke, TIA, or heart  attack.  Having a history of preeclampsia.  Migraine headache. What are the signs or symptoms? Symptoms of a TIA are the same as those of a stroke. The symptoms develop suddenly, and then go away quickly. They may include:  Weakness or numbness in your face, arm, or leg, especially on one side of your body.  Trouble walking or moving your arms or legs.  Trouble speaking, understanding speech, or both (aphasia).  Vision changes, such as double vision, blurred vision, or loss of vision.  Dizziness.  Confusion.  Loss of balance or coordination.  Nausea and vomiting.  Severe headache. If possible, note what time your symptoms started. Tell your health care provider.   How is this diagnosed? This condition may be diagnosed based on:  Your symptoms and medical history.  A physical exam.  Imaging tests, usually a CT scan or MRI of the brain.  Blood tests. You may also have other tests, including:  Electrocardiogram (ECG).  Echocardiogram.  Carotid ultrasound.  A scan of blood circulation in the brain (CT angiogram or MR angiogram).  Continuous heart monitoring. How is this treated? The goal of treatment is to reduce the risk for a stroke. Stroke prevention therapies may include:  Changes to diet and lifestyle, such as being physically active and stopping smoking.  Medicines to thin the blood (antiplatelets or anticoagulants).  Blood pressure medicines.  Medicines to reduce cholesterol.  Treating other health conditions, such as diabetes or AFib. If testing shows a narrowing in the arteries to your brain, your health care provider may recommend a procedure, such  as:  Carotid endarterectomy. This is done to remove the blockage from your artery.  Carotid angioplasty and stenting. This uses a tube (stent) to open or widen an artery in the neck. The stent helps keep the artery open by supporting the artery walls. Follow these instructions at  home: Medicines  Take over-the-counter and prescription medicines only as told by your health care provider.  If you were told to take a medicine to thin your blood, such as aspirin or an anticoagulant, take it exactly as told by your health care provider. ? Taking too much blood-thinning medicine can cause bleeding. Taking too little will not protect you against a stroke and other problems. Eating and drinking  Eat 5 or more servings of fruits and vegetables each day.  Follow guidelines from your health care provider about your diet. You may need to follow a certain diet to help manage risk factors for stroke. This may include: ? Eating a low-fat, low-salt diet. ? Choosing high-fiber foods. ? Limiting carbohydrates and sugar.  If you drink alcohol: ? Limit how much you have to:  0-1 drink a day for women who are not pregnant.  0-2 drinks a day for men. ? Know how much alcohol is in a drink. In the [place] one drink equals one 12 oz bottle of beer (349mL), one 5 oz glass of wine (121mL), or one 1 oz glass of hard liquor (59mL).   General instructions  Maintain a healthy weight.  Try to get at least 30 minutes of exercise on most days.  Get treatment if you have sleep apnea.  Do not use any products that contain nicotine or tobacco. These products include cigarettes, chewing tobacco, and vaping devices, such as e-cigarettes. If you need help quitting, ask your health care provider.  Do not use drugs.  Keep all follow-up visits. This is important. Where to find more information  American Stroke Association: www.stroke.org Get help right away if:  You have chest pain or an irregular heartbeat.  You have any symptoms of a stroke. "BE FAST" is an easy way to remember the main warning signs of a stroke. ? B - Balance. Signs are dizziness, sudden trouble walking, or loss of balance. ? E - Eyes. Signs are trouble seeing or a sudden change in vision. ? F - Face. Signs are sudden  weakness or numbness of the face, or the face or eyelid drooping on one side. ? A - Arms. Signs are weakness or numbness in an arm. This happens suddenly and usually on one side of the body. ? S - Speech. Signs are sudden trouble speaking, slurred speech, or trouble understanding what people say. ? T - Time. Time to call emergency services. Write down what time symptoms started.  You have other signs of a stroke, such as: ? A sudden, severe headache with no known cause. ? Nausea or vomiting. ? Seizure. These symptoms may represent a serious problem that is an emergency. Do not wait to see if the symptoms will go away. Get medical help right away. Call your local emergency services (911 in the [place] Do not drive yourself to the hospital. Summary  A transient ischemic attack (TIA) happens when an artery in the head or neck is blocked. The blockage clears before there is any permanent brain damage. A TIA is a medical emergency.  Symptoms of a TIA are the same as those of a stroke. The symptoms develop suddenly, and then go away quickly.  Having a  TIA means that you are at higher risk for a stroke.  The goal of treatment is to reduce your risk for a stroke. Treatment may include medicines to thin the blood and changes to diet and lifestyle. This information is not intended to replace advice given to you by your health care provider. Make sure you discuss any questions you have with your health care provider. Document Revised: 10/24/2019 Document Reviewed: 10/24/2019 Elsevier Patient Education  [place].

## 2020-08-29 NOTE — Progress Notes (Signed)
Subjective:  Patient ID: Jeffery George, male    DOB: [birth year]  Age: 75 y.o. MRN: [id number]  CC:  Chief Complaint  Patient presents with  . Numbness    Pt reports he has had numbness in hands arms shoulder, face, started with an episode a few weeks ago in his left arm and in face, pt notes over span of 3 days had 3 episodes of this apx 3 weeks ago, did see cardiology. Work is not allowing him back to work until he has been cleared here.     HPI Jeffery George presents for  Numbness/dysesthesias Initially noted on note with cardiology when seen on May 6.  Bilateral hand and facial numbness, difficulty grasping things noted on MyChart message, was evaluated by cardiology.  Per their notes he had some difficulty with speech, felt like lips were numb, left arm felt numb and clumsy, lasted 1 hour then resolved.  Few recurrent episodes, each time with full recovery.  He had a nonfocal neurologic exam without gross deficits appreciated and equal strength on cardiology evaluation.Concern for possible TIAs.  No history of arrhythmia or cardiac awareness with his symptoms, so deferred further cardiac evaluation outside of echocardiogram.  Cardiac echo completed on May 11.  Mild aortic stenosis.  EF greater than 75%.  Left ventricle with hyperdynamic function.  No regional wall motion abnormalities.  Moderate LVH.  Normal right ventricular systolic function.  Mild aortic valve stenosis.  Borderline dilatation of aortic root at 38 mm, borderline dilatation of ascending aorta, 39 mm.  He does have history of hyperlipidemia, takes Lipitor, also on aspirin 81 mg daily.  History of hypertension, managed with losartan and amlodipine, history of diabetes managed with metformin.  Reports 3 episodes over 4 days. Initial episode longest at 28min, then repeat 10-13 mins. Last episode about 2 weeks ago. Denies any numbness, weakness, slurred speech in past 2 weeks. Prior symptoms on left side only.  No new  headache.  Has remained on aspirin $RemoveBe'81mg'NUeCrocVr$  qd.  No personal hx of CVA. Mother and father both had CVA.  Would like to RTW. 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 No use of machinery. On ladders at times during the day.   No hx of PUD or gastrointestinal bleeding.   Lab Results  Component Value Date   CHOL 120 07/11/2020   HDL 49 07/11/2020   LDLCALC 56 07/11/2020   TRIG 76 07/11/2020   CHOLHDL 2.4 07/11/2020   BP Readings from Last 3 Encounters:  08/29/20 132/74  08/16/20 (!) 148/82  07/30/20 140/80   Lab Results  Component Value Date   HGBA1C 6.1 (H) 07/11/2020    History Patient Active Problem List   Diagnosis Date Noted  . History of COVID-19 05/08/2020  . Cough 05/08/2020  . Rotator cuff syndrome, left 11/15/2018  . Chronic left shoulder pain 08/09/2018  . Left retinal detachment 08/11/2017  . Gout 02/27/2013  . Essential hypertension, benign 02/27/2013  . Type II or unspecified type diabetes mellitus without mention of complication, uncontrolled 02/27/2013   Past Medical History:  Diagnosis Date  . Arthritis   . Diabetes mellitus without complication ([place])   . Heart murmur   . Hypertension    Past Surgical History:  Procedure Laterality Date  . CATARACT EXTRACTION    . FRACTURE SURGERY     Lt ankle  . GAS/FLUID EXCHANGE Left 08/12/2017   Procedure: C3F8 GAS/FLUID EXCHANGE;  Surgeon: Coralyn Pear,  Aaron Edelman, MD;  Location: [place];  Service: Ophthalmology;  Laterality: Left;  . HERNIA REPAIR    . IRIDOTOMY / IRIDECTOMY Bilateral    stents  . VASECTOMY    . VITRECTOMY 25 GAUGE WITH SCLERAL BUCKLE Left 08/12/2017   Procedure: VITRECTOMY 25 GAUGE WITH SCLERAL BUCKLE WITH ENDOLASER;  Surgeon: Bernarda Caffey, MD;  Location: [place];  Service: Ophthalmology;  Laterality: Left;   No Known Allergies Prior to Admission medications   Medication Sig Start Date End Date Taking? Authorizing Provider  ACCU-CHEK SOFTCLIX LANCETS  lancets Test blood sugar once daily. Dx E11.9 08/26/15  Yes [name], MD  amLODipine (NORVASC) 5 MG tablet TAKE 1 TABLET EVERY DAY 07/04/20  Yes [name], MD  aspirin EC 81 MG tablet Take 1 tablet (81 mg total) by mouth daily. 02/27/13  Yes Shawnee Knapp, MD  atorvastatin (LIPITOR) 10 MG tablet Take 1 tablet (10 mg total) by mouth daily. 07/04/20  Yes [name], MD  blood glucose meter kit and supplies TrueMetrix, TruTrack, Accu-Chek Aviva Expert, Accu-Chek Aviva Plus, Accu-Chek Guide, Accu-Chek Nano, or Accu-Chek Smartview.   Use once per day. 07/31/17  Yes [name], MD  Blood Glucose Monitoring Suppl (BLOOD GLUCOSE MONITOR KIT) KIT Use to test blood sugar twice daily. 07/17/13  Yes [name], MD  colchicine (COLCRYS) 0.6 MG tablet Take 2 tabs po at symptom onset. Repeat 1 tab po 1 hour later. 06/22/17  Yes [name], MD  Lancets [facility] ULTRASOFT) lancets Use as instructed 06/05/13  Yes [name], MD  latanoprost (XALATAN) 0.005 % ophthalmic solution Place 1 drop into both eyes every evening.  07/23/17  Yes [provider]  losartan (COZAAR) 100 MG tablet Take 1 tablet (100 mg total) by mouth daily. 07/04/20  Yes [name], MD  metFORMIN (GLUCOPHAGE) 500 MG tablet Take 1 tablet (500 mg total) by mouth daily with breakfast. 07/04/20  Yes [name], MD  Omega-3 Fatty Acids (FISH OIL) 1000 MG CAPS Take by mouth daily.    Yes [provider]  TRUE METRIX BLOOD GLUCOSE TEST test strip TEST ONE TIME DAILY 03/11/18  Yes [name], MD  TRUEPLUS LANCETS 33G MISC TEST ONE TIME DAILY 03/11/18  Yes [name], MD   Social History   Socioeconomic History  . Marital status: Married    Spouse name: Not on file  . Number of children: Not on file  . Years of education: Not on file  . Highest education level: Not on file  Occupational History  . Occupation: Educational psychologist  Tobacco Use  . Smoking status:  Former Research scientist (life sciences)  . Smokeless tobacco: Never Used  Substance and Sexual Activity  . Alcohol use: No    Alcohol/week: 0.0 standard drinks  . Drug use: No  . Sexual activity: Never  Other Topics Concern  . Not on file  Social History Narrative   Married   Counsellor at [employer] Strain: Not on file  Food Insecurity: Not on file  Transportation Needs: Not on file  Physical Activity: Not on file  Stress: Not on file  Social Connections: Not on file  Intimate Partner Violence: Not on file    Review of Systems Per HPI.   Objective:   Vitals:   08/29/20 1301  BP: 132/74  Pulse: 86  Resp: 16  Temp: 98 F (36.7 C)  TempSrc: Temporal  SpO2: 95%  Weight: 248 lb 6.4 oz (112.7 kg)  Height: 6' (1.829 m)     Physical Exam Vitals reviewed.  Constitutional:      Appearance: He is well-developed.  HENT:     Head: Normocephalic and atraumatic.  Eyes:     General: No visual field deficit.    Pupils: Pupils are equal, round, and reactive to light.  Neck:     Vascular: No carotid bruit or JVD.     Comments: No apparent bruit.  Cardiovascular:     Rate and Rhythm: Normal rate and regular rhythm.     Heart sounds: Normal heart sounds. No murmur (2-7/0 systolic. ) heard.   Pulmonary:     Effort: Pulmonary effort is normal.     Breath sounds: Normal breath sounds. No rales.  Skin:    General: Skin is warm and dry.  Neurological:     General: No focal deficit present.     Mental Status: He is alert and oriented to person, place, and time.     GCS: GCS eye subscore is 4. GCS verbal subscore is 5. GCS motor subscore is 6.     Cranial Nerves: No cranial nerve deficit, dysarthria or facial asymmetry.     Sensory: Sensation is intact.     Motor: Motor function is intact. No weakness, tremor, atrophy, abnormal muscle tone, seizure activity or pronator drift.     Coordination: Coordination is intact. Romberg sign  negative. Coordination normal. Finger-Nose-Finger Test and Heel to [facility] Test normal.     Gait: Gait is intact.     Comments: Full facial movements, no droop.  No pronator drift.  Nonfocal neurologic exam at present.  Psychiatric:        Mood and Affect: Mood normal.        Behavior: Behavior normal.        Assessment & Plan:  Jeffery George is a 75 y.o. male . Left arm weakness  Numbness and tingling of left side of face  TIA (transient ischemic attack)  Asymptomatic, and no symptoms in the past 2 weeks but initial symptoms concerning for TIA with left sided symptoms.  He does have CV risk factors of hypertension, hyperlipidemia, diabetes, family history of CVA.  A1c, LDL and BP overall stable on most recent testing.  Nonfocal exam at the current time.  He is on aspirin, 81 mg.  Potentially may need increased dose as well as carotid Dopplers, and possible neuroimaging but will discuss with neurology on-call.  I did return him to work for now as asymptomatic but should avoid work from elevated surfaces/ladders, and no operation of machinery.  911 precautions discussed if any return of symptoms.  Understanding expressed.   D/w neurology. Will add plavix to current ASA for DAPT for 30 days, order carotid doppler, and MRI brain without, refer to neuro. Plan and bleeding risk d/w patient.    No orders of the defined types were placed in this encounter.  Patient Instructions   Continue aspirin, and I will discuss further workup with neurology.  If any return of arm/face numbness or weakness call 911 to be seen in emergency room.   Return to the clinic or go to the nearest emergency room if any of your symptoms worsen or new symptoms occur.   Transient Ischemic Attack A transient ischemic attack (TIA) causes stroke-like symptoms that go away quickly. Having a TIA means that a person is at higher risk for a stroke.  A TIA happens when blood supply to the brain is blocked temporarily. A TIA  is a medical emergency. What are the causes? This condition is caused by a temporary blockage in an artery in the head or neck. This means the brain does not get the blood supply it needs. There is no permanent brain damage with a TIA. A blockage can be caused by:  Fatty buildup in an artery in the head or neck (atherosclerosis).  A blood clot.  An artery tear (dissection).  Inflammation of an artery (vasculitis). Sometimes the cause is not known. What increases the risk? Certain factors may make you more likely to develop this condition. Some of these are things that you can change, such as:  Obesity.  Using products that contain nicotine or tobacco.  Taking oral birth control, especially if you also use tobacco.  Not being active.  Heavy alcohol use.  Drug use, especially cocaine and methamphetamine. Medical conditions that may increase your risk include:  High blood pressure (hypertension).  High cholesterol.  Diabetes.  Heart disease (coronary artery disease).  An irregular heartbeat, also called atrial fibrillation (AFib).  Sickle cell disease.  Sleep problems (sleep apnea).  Chronic inflammatory diseases, such as rheumatoid arthritis or lupus.  Blood clotting disorders (hypercoagulable state). Other risk factors include:  Being over the age of 69.  Being male.  Family history of stroke.  Previous history of blood clots, stroke, TIA, or heart attack.  Having a history of preeclampsia.  Migraine headache. What are the signs or symptoms? Symptoms of a TIA are the same as those of a stroke. The symptoms develop suddenly, and then go away quickly. They may include:  Weakness or numbness in your face, arm, or leg, especially on one side of your body.  Trouble walking or moving your arms or legs.  Trouble speaking, understanding speech, or both (aphasia).  Vision changes, such as double vision, blurred vision, or loss of  vision.  Dizziness.  Confusion.  Loss of balance or coordination.  Nausea and vomiting.  Severe headache. If possible, note what time your symptoms started. Tell your health care provider.   How is this diagnosed? This condition may be diagnosed based on:  Your symptoms and medical history.  A physical exam.  Imaging tests, usually a CT scan or MRI of the brain.  Blood tests. You may also have other tests, including:  Electrocardiogram (ECG).  Echocardiogram.  Carotid ultrasound.  A scan of blood circulation in the brain (CT angiogram or MR angiogram).  Continuous heart monitoring. How is this treated? The goal of treatment is to reduce the risk for a stroke. Stroke prevention therapies may include:  Changes to diet and lifestyle, such as being physically active and stopping smoking.  Medicines to thin the blood (antiplatelets or anticoagulants).  Blood pressure medicines.  Medicines to reduce cholesterol.  Treating other health conditions, such as diabetes or AFib. If testing shows a narrowing in the arteries to your brain, your health care provider may recommend a procedure, such as:  Carotid endarterectomy. This is done to remove the blockage from your artery.  Carotid angioplasty and stenting. This uses a tube (stent) to open or widen an artery in the neck. The stent helps keep the artery open by supporting the artery walls. Follow these instructions at home: Medicines  Take over-the-counter and prescription medicines only as told by your health care provider.  If you were told to take a medicine to thin your blood, such as  aspirin or an anticoagulant, take it exactly as told by your health care provider. ? Taking too much blood-thinning medicine can cause bleeding. Taking too little will not protect you against a stroke and other problems. Eating and drinking  Eat 5 or more servings of fruits and vegetables each day.  Follow guidelines from your  health care provider about your diet. You may need to follow a certain diet to help manage risk factors for stroke. This may include: ? Eating a low-fat, low-salt diet. ? Choosing high-fiber foods. ? Limiting carbohydrates and sugar.  If you drink alcohol: ? Limit how much you have to:  0-1 drink a day for women who are not pregnant.  0-2 drinks a day for men. ? Know how much alcohol is in a drink. In the [place] one drink equals one 12 oz bottle of beer (344mL), one 5 oz glass of wine (161mL), or one 1 oz glass of hard liquor (39mL).   General instructions  Maintain a healthy weight.  Try to get at least 30 minutes of exercise on most days.  Get treatment if you have sleep apnea.  Do not use any products that contain nicotine or tobacco. These products include cigarettes, chewing tobacco, and vaping devices, such as e-cigarettes. If you need help quitting, ask your health care provider.  Do not use drugs.  Keep all follow-up visits. This is important. Where to find more information  American Stroke Association: www.stroke.org Get help right away if:  You have chest pain or an irregular heartbeat.  You have any symptoms of a stroke. "BE FAST" is an easy way to remember the main warning signs of a stroke. ? B - Balance. Signs are dizziness, sudden trouble walking, or loss of balance. ? E - Eyes. Signs are trouble seeing or a sudden change in vision. ? F - Face. Signs are sudden weakness or numbness of the face, or the face or eyelid drooping on one side. ? A - Arms. Signs are weakness or numbness in an arm. This happens suddenly and usually on one side of the body. ? S - Speech. Signs are sudden trouble speaking, slurred speech, or trouble understanding what people say. ? T - Time. Time to call emergency services. Write down what time symptoms started.  You have other signs of a stroke, such as: ? A sudden, severe headache with no known cause. ? Nausea or  vomiting. ? Seizure. These symptoms may represent a serious problem that is an emergency. Do not wait to see if the symptoms will go away. Get medical help right away. Call your local emergency services (911 in the [place] Do not drive yourself to the hospital. Summary  A transient ischemic attack (TIA) happens when an artery in the head or neck is blocked. The blockage clears before there is any permanent brain damage. A TIA is a medical emergency.  Symptoms of a TIA are the same as those of a stroke. The symptoms develop suddenly, and then go away quickly.  Having a TIA means that you are at higher risk for a stroke.  The goal of treatment is to reduce your risk for a stroke. Treatment may include medicines to thin the blood and changes to diet and lifestyle. This information is not intended to replace advice given to you by your health care provider. Make sure you discuss any questions you have with your health care provider. Document Revised: 10/24/2019 Document Reviewed: 10/24/2019 Elsevier Patient Education  [place].  Signed, Merri Ray, MD [facility]

## 2020-08-30 ENCOUNTER — Telehealth: Payer: Self-pay | Admitting: Neurology

## 2020-08-30 ENCOUNTER — Encounter: Payer: Self-pay | Admitting: Neurology

## 2020-08-30 NOTE — Telephone Encounter (Signed)
Received a call yesterday from [name], MD that patient has been having likely TIAs.  Episode occurred about 3 weeks ago.  There were 3 episodes, however.  Patient had left-sided weakness, some difficulty speaking, some numbness on the left side.  Each episode lasted anywhere from 10 minutes to 40 minutes.  Patient has been back to normal for the last 3 weeks.  Cardiology already did echocardiogram.  Did recommend carotid ultrasound.  Patient was already on aspirin prior.  Recommended DAPT x 30 days, MRI brain without contrast and we would be happy to see the patient in f/u.  Dr. Carlota Raspberry said chol well controlled.

## 2020-09-02 NOTE — Progress Notes (Signed)
Virtual Visit via Telephone Note   This visit type was conducted due to national recommendations for restrictions regarding the COVID-19 Pandemic (e.g. social distancing) in an effort to limit this patient's exposure and mitigate transmission in our community.  Due to his co-morbid illnesses, this patient is at least at moderate risk for complications without adequate follow up.  This format is felt to be most appropriate for this patient at this time.  The patient did not have access to video technology/had technical difficulties with video requiring transitioning to audio format only (telephone).  All issues noted in this document were discussed and addressed.  No physical exam could be performed with this format.  Please refer to the patient's chart for his  consent to telehealth for [facility]    Date:  09/02/2020   ID:  Jeffery George, DOB Sep 01, 1945, MRN [id number] The patient was identified using 2 identifiers.  Patient Location: Home Provider Location: Office/Clinic   PCP:  [name], MD   [facility] Providers Cardiologist:  None Electrophysiologist:  Vickie Epley, MD {  Chief Complaint: Follow-up echo results/aortic stenosis  History of Present Illness:    Jeffery George is a 75 y.o. male who presents for a follow up visit.  I last saw the patient July 30, 2020 for a reported heart murmur.  At that echo we ordered an echocardiogram and planned to touch base today to discuss the results.  His echo was completed Aug 21, 2020 and showed mild aortic stenosis.  The left ventricular function was normal.  There is moderate LVH.  The mean gradient across the aortic valve is 13 mmHg.  Today I spoke to the patient and his wife today on the telephone.  He is doing well.  I explained the results of his echocardiogram in detail including the need for ongoing surveillance of the aortic valve on an annual basis.  They were okay with this plan.      Past Medical  History:  Diagnosis Date  . Arthritis   . Diabetes mellitus without complication ([place])   . Heart murmur   . Hypertension    Past Surgical History:  Procedure Laterality Date  . CATARACT EXTRACTION    . FRACTURE SURGERY     Lt ankle  . GAS/FLUID EXCHANGE Left 08/12/2017   Procedure: C3F8 GAS/FLUID EXCHANGE;  Surgeon: Bernarda Caffey, MD;  Location: [place];  Service: Ophthalmology;  Laterality: Left;  . HERNIA REPAIR    . IRIDOTOMY / IRIDECTOMY Bilateral    stents  . VASECTOMY    . VITRECTOMY 25 GAUGE WITH SCLERAL BUCKLE Left 08/12/2017   Procedure: VITRECTOMY 25 GAUGE WITH SCLERAL BUCKLE WITH ENDOLASER;  Surgeon: Bernarda Caffey, MD;  Location: [place];  Service: Ophthalmology;  Laterality: Left;     No outpatient medications have been marked as taking for the 09/03/20 encounter (Appointment) with Vickie Epley, MD.     Allergies:   Patient has no known allergies.   Social History   Tobacco Use  . Smoking status: Former Research scientist (life sciences)  . Smokeless tobacco: Never Used  Substance Use Topics  . Alcohol use: No    Alcohol/week: 0.0 standard drinks  . Drug use: No     Family Hx: The patient's family history includes Cancer in his mother; Diabetes in his brother, brother, and mother; Heart disease in his brother and father; Stroke in his father and mother. There is no history of Colon cancer.  ROS:   Please see  the history of present illness.     All other systems reviewed and are negative.   Prior CV studies:   The following studies were reviewed today:  Aug 21, 2020 echo personally reviewed Left ventricular function normal, greater than 75% Right ventricular function normal Mildly dilated left atrium No MR Calcified aortic valve Mild aortic stenosis    Labs/Other Tests and Data Reviewed:    EKG:  No ECG reviewed.  Recent Labs: 04/23/2020: Hemoglobin 14.0; Platelets 202 07/11/2020: ALT 22; BUN 21; Creatinine, Ser 0.93; Potassium 4.4; Sodium 142   Recent Lipid Panel Lab  Results  Component Value Date/Time   CHOL 120 07/11/2020 08:35 AM   TRIG 76 07/11/2020 08:35 AM   HDL 49 07/11/2020 08:35 AM   CHOLHDL 2.4 07/11/2020 08:35 AM   CHOLHDL 2.4 11/25/2015 11:31 AM   LDLCALC 56 07/11/2020 08:35 AM    Wt Readings from Last 3 Encounters:  08/29/20 248 lb 6.4 oz (112.7 kg)  08/16/20 244 lb (110.7 kg)  07/30/20 245 lb (111.1 kg)      Objective:    Vital Signs:  There were no vitals taken for this visit.    ASSESSMENT & PLAN:    1. Aortic valve stenosis, mild No symptoms of chest pain, presyncope or syncope or shortness of breath.  Given the severity of the aortic valve stenosis, we will plan for repeat echo in follow-up visit in 1 year.  He will reach out if he develops any symptoms in the meantime.  2.  Hypertension Continue home medications including amlodipine, losartan  Follow-up 1 year with echo   Total time of encounter: 15 minutes total time of encounter, including telephone encounter, coordination of care and counseling regarding medical decision making re: aortic valve stenosis.   Medication Adjustments/Labs and Tests Ordered: Current medicines are reviewed at length with the patient today.  Concerns regarding medicines are outlined above.   Tests Ordered: No orders of the defined types were placed in this encounter.   Medication Changes: No orders of the defined types were placed in this encounter.   Follow Up:  In Person in 1 year(s)  Signed, Vickie Epley, MD  09/02/2020 8:52 PM    [facility]

## 2020-09-03 ENCOUNTER — Telehealth (INDEPENDENT_AMBULATORY_CARE_PROVIDER_SITE_OTHER): Payer: Medicare HMO | Admitting: Cardiology

## 2020-09-03 ENCOUNTER — Other Ambulatory Visit: Payer: Self-pay

## 2020-09-03 DIAGNOSIS — I350 Nonrheumatic aortic (valve) stenosis: Secondary | ICD-10-CM | POA: Diagnosis not present

## 2020-09-03 DIAGNOSIS — I10 Essential (primary) hypertension: Secondary | ICD-10-CM | POA: Diagnosis not present

## 2020-09-10 ENCOUNTER — Telehealth: Payer: Self-pay | Admitting: Family Medicine

## 2020-09-10 NOTE — Telephone Encounter (Signed)
[facility] called in stating that they received the scripts for the True Metrix and BD single uses swabs with no signature from Dr. Carlota Raspberry.  Can we resend these scripts into the pharmacy.  Please advise

## 2020-09-12 ENCOUNTER — Ambulatory Visit
Admission: RE | Admit: 2020-09-12 | Discharge: 2020-09-12 | Disposition: A | Discharge status: Home / Self Care | Payer: Medicare HMO | Source: Ambulatory Visit | Attending: Family Medicine | Admitting: Family Medicine

## 2020-09-12 DIAGNOSIS — E119 Type 2 diabetes mellitus without complications: Secondary | ICD-10-CM | POA: Diagnosis not present

## 2020-09-12 DIAGNOSIS — R202 Paresthesia of skin: Secondary | ICD-10-CM

## 2020-09-12 DIAGNOSIS — I6523 Occlusion and stenosis of bilateral carotid arteries: Secondary | ICD-10-CM | POA: Diagnosis not present

## 2020-09-12 DIAGNOSIS — G459 Transient cerebral ischemic attack, unspecified: Secondary | ICD-10-CM

## 2020-09-12 DIAGNOSIS — R531 Weakness: Secondary | ICD-10-CM | POA: Diagnosis not present

## 2020-09-12 DIAGNOSIS — R29898 Other symptoms and signs involving the musculoskeletal system: Secondary | ICD-10-CM

## 2020-09-12 DIAGNOSIS — I10 Essential (primary) hypertension: Secondary | ICD-10-CM | POA: Diagnosis not present

## 2020-09-12 DIAGNOSIS — R200 Anesthesia of skin: Secondary | ICD-10-CM

## 2020-09-14 ENCOUNTER — Other Ambulatory Visit: Payer: Self-pay

## 2020-09-14 ENCOUNTER — Ambulatory Visit
Admission: RE | Admit: 2020-09-14 | Discharge: 2020-09-14 | Disposition: A | Discharge status: Home / Self Care | Payer: Medicare HMO | Source: Ambulatory Visit | Attending: Family Medicine | Admitting: Family Medicine

## 2020-09-14 DIAGNOSIS — R29810 Facial weakness: Secondary | ICD-10-CM | POA: Diagnosis not present

## 2020-09-14 DIAGNOSIS — G459 Transient cerebral ischemic attack, unspecified: Secondary | ICD-10-CM

## 2020-09-14 DIAGNOSIS — Z8673 Personal history of transient ischemic attack (TIA), and cerebral infarction without residual deficits: Secondary | ICD-10-CM | POA: Diagnosis not present

## 2020-09-14 DIAGNOSIS — R29898 Other symptoms and signs involving the musculoskeletal system: Secondary | ICD-10-CM

## 2020-09-14 DIAGNOSIS — R200 Anesthesia of skin: Secondary | ICD-10-CM | POA: Diagnosis not present

## 2020-09-19 NOTE — Progress Notes (Signed)
NEUROLOGY CONSULTATION NOTE  [name] MRN: [id number] DOB: 1947/03/188  Referring provider: Merri Ray, MD Primary care provider: Merri Ray, MD  Reason for consult:  Possible TIAs  Assessment/Plan:    Transient ischemic attack presenting with left sided symptoms - given that he had 3 habitual spells back to back, suspect that it could be related to right ICA stenosis.  As episodes were all identical, less likely cardioembolic Right internal carotid artery stenosis Hypertension Type 2 diabetes mellitus Hyperlipidemia  1.For more accurate evaluation of degree of stenosis, will check CTA of neck.  Will also check CTA of head to rule out possible right MCA stenosis 2.  Pending CTA results, will likely refer to vascular surgery if carotid stenosis confirmed 3.  Given involvement of large vessel, would extend dual antiplatelet therapy to 3 months (or until potential surgery if vascular determines surgery is warranted) - then Plavix 4m daily alone - re-ordered Plavix as he only has 1 week left. 4.  Optimize blood pressure control.  Follow up with PCP. 5.  Continue statin.  LDL goal less than 70 6.  Continue glycemic control. Hgb A1c goal less than 7 7.  Further recommendations pending results.  Otherwise, follow up 6 months.   Subjective:  Jeffery George a 75year old right-handed male with HTN and diabetes who presents for possible TIAs  History supplemented by referring provider's note.  Longstanding history of heart murmur.   Around the first week of May, he had three TIA events over a 5 day period.  He describes onset of left facial numbness and left arm/hand numbness and weakness lasting 30 minutes.  He had a recurrent episode the following day lasting a couple of minutes and another one a couple of days later also lasting a couple of minutes.  He followed up with cardiology.  Echocardiogram on 5/11 showed EF greater than 75% with no cardiac source of embolus.   He  was already on ASA 821m  He saw his PCP on 5/19, whe added Plavix for dual antiplatelet therapy for 30 days.  Carotid ultrasound on 09/12/2020 showed minimal to moderate atherosclerotic plaque resulting in 50-69% stenosis of the right ICA, minimal to moderate atherosclerotic plaque without hemodynamically significant stenosis of the left ICA, antegrade flow of the vertebral arteries but possible hemodynamically significant stenosis of the right subclavian artery.  MRI of brain without contrast on 09/14/2020 personally reviewed showed extensive chronic small vessel ischemic changes in the bilateral cerebral white matter with superficial siderosis involving the vertex but less involvement of the posterior fossa but no acute or subacute intracranial abnormalities such as stroke or bleed.    Labs from 07/11/2020 demonstrated LDL of 56 and Hgb A1c 6.1.   He takes ASA 8139maily, atorvastatin 75m47mor hyperlipidemia), losartan and amlodipine (for hypertension) and metformin (for diabetes).  He has history of systolic murmur.     PAST MEDICAL HISTORY: Past Medical History:  Diagnosis Date   Arthritis    Diabetes mellitus without complication (HCC)[place] Heart murmur    Hypertension     PAST SURGICAL HISTORY: Past Surgical History:  Procedure Laterality Date   CATARACT EXTRACTION     FRACTURE SURGERY     Lt ankle   GAS/FLUID EXCHANGE Left 08/12/2017   Procedure: C3F8 GAS/FLUID EXCHANGE;  Surgeon: ZamoBernarda Caffey;  [facility];  Laterality: Left;   HERNIA REPAIR     IRIDOTOMY / IRIDECTOMY Bilateral  stents   VASECTOMY     VITRECTOMY 25 GAUGE WITH SCLERAL BUCKLE Left 08/12/2017   Procedure: VITRECTOMY 25 GAUGE WITH SCLERAL BUCKLE WITH ENDOLASER;  Surgeon: Bernarda Caffey, MD;  Location: [place];  Service: Ophthalmology;  Laterality: Left;    MEDICATIONS: Current Outpatient Medications on File Prior to Visit  Medication Sig Dispense Refill   ACCU-CHEK SOFTCLIX LANCETS  lancets Test blood sugar once daily. Dx E11.9 100 each 2   amLODipine (NORVASC) 5 MG tablet TAKE 1 TABLET EVERY DAY 90 tablet 1   aspirin EC 81 MG tablet Take 1 tablet (81 mg total) by mouth daily.     atorvastatin (LIPITOR) 10 MG tablet Take 1 tablet (10 mg total) by mouth daily. 90 tablet 2   blood glucose meter kit and supplies TrueMetrix, TruTrack, Accu-Chek Aviva Expert, Accu-Chek Aviva Plus, Accu-Chek Guide, Accu-Chek Nano, or Accu-Chek Smartview.   Use once per day. 1 each 0   Blood Glucose Monitoring Suppl (BLOOD GLUCOSE MONITOR KIT) KIT Use to test blood sugar twice daily. 1 each 0   clopidogrel (PLAVIX) 75 MG tablet Take 1 tablet (75 mg total) by mouth daily. 30 tablet 0   colchicine (COLCRYS) 0.6 MG tablet Take 2 tabs po at symptom onset. Repeat 1 tab po 1 hour later. 12 tablet 0   Lancets (ONETOUCH ULTRASOFT) lancets Use as instructed 100 each 12   latanoprost (XALATAN) 0.005 % ophthalmic solution Place 1 drop into both eyes every evening.      losartan (COZAAR) 100 MG tablet Take 1 tablet (100 mg total) by mouth daily. 90 tablet 1   metFORMIN (GLUCOPHAGE) 500 MG tablet Take 1 tablet (500 mg total) by mouth daily with breakfast. 90 tablet 1   Omega-3 Fatty Acids (FISH OIL) 1000 MG CAPS Take by mouth daily.      TRUE METRIX BLOOD GLUCOSE TEST test strip TEST ONE TIME DAILY 100 each 1   TRUEPLUS LANCETS 33G MISC TEST ONE TIME DAILY 100 each 0   No current facility-administered medications on file prior to visit.    ALLERGIES: No Known Allergies  FAMILY HISTORY: Family History  Problem Relation Age of Onset   Cancer Mother    Diabetes Mother    Stroke Mother    Heart disease Father    Stroke Father    Diabetes Brother    Heart disease Brother    Diabetes Brother    Colon cancer Neg Hx     Objective:  Blood pressure (!) 185/78, pulse 91, height 6' (1.829 m), weight 245 lb 12.8 oz (111.5 kg), SpO2 96 %. General: No acute distress.  Patient appears well-groomed.   Head:   Normocephalic/atraumatic Eyes:  fundi examined but not visualized Neck: supple, no paraspinal tenderness, full range of motion Back: No paraspinal tenderness Heart: regular rate and rhythm Lungs: Clear to auscultation bilaterally. Vascular: No carotid bruits. Neurological Exam: Mental status: alert and oriented to person, place, and time, recent and remote memory intact, fund of knowledge intact, attention and concentration intact, speech fluent and not dysarthric, language intact. Cranial nerves: CN I: not tested CN II: pupils equal, round and reactive to light, visual fields intact CN III, IV, VI:  full range of motion, no nystagmus, no ptosis CN V: facial sensation intact. CN VII: upper and lower face symmetric CN VIII: hearing intact CN IX, X: gag intact, uvula midline CN XI: sternocleidomastoid and trapezius muscles intact CN XII: tongue midline Bulk & Tone: normal, no fasciculations. Motor:  muscle strength 5/5  throughout Sensation:  Pinprick and vibratory sensation reduced in feet Deep Tendon Reflexes:  2+ throughout,  toes downgoing.   Finger to nose testing:  Without dysmetria.   Heel to shin:  Without dysmetria.   Gait:  Mild limp due to left ankle injury  Romberg negative.    Thank you for allowing me to take part in the care of this patient.  Metta Clines, DO  CC: Jeffery Ray, MD

## 2020-09-20 ENCOUNTER — Ambulatory Visit (INDEPENDENT_AMBULATORY_CARE_PROVIDER_SITE_OTHER): Payer: Medicare HMO | Admitting: Neurology

## 2020-09-20 ENCOUNTER — Encounter: Payer: Self-pay | Admitting: Neurology

## 2020-09-20 ENCOUNTER — Other Ambulatory Visit: Payer: Self-pay

## 2020-09-20 VITALS — BP 185/78 | HR 91 | Ht 72.0 in | Wt 245.8 lb

## 2020-09-20 DIAGNOSIS — E119 Type 2 diabetes mellitus without complications: Secondary | ICD-10-CM

## 2020-09-20 DIAGNOSIS — I10 Essential (primary) hypertension: Secondary | ICD-10-CM | POA: Diagnosis not present

## 2020-09-20 DIAGNOSIS — I6521 Occlusion and stenosis of right carotid artery: Secondary | ICD-10-CM | POA: Diagnosis not present

## 2020-09-20 DIAGNOSIS — G459 Transient cerebral ischemic attack, unspecified: Secondary | ICD-10-CM

## 2020-09-20 MED ORDER — CLOPIDOGREL BISULFATE 75 MG PO TABS: 75.0000 mg | ORAL_TABLET | Freq: Every day | ORAL | 1 refills | Status: DC

## 2020-09-20 NOTE — Patient Instructions (Addendum)
Continue aspirin and Plavix. Will get CTA of head and neck.  May need to refer you to vascular surgery Follow up with PCP to optimize blood pressure control Follow up 6 months

## 2020-09-22 ENCOUNTER — Other Ambulatory Visit: Payer: Self-pay | Admitting: Family Medicine

## 2020-09-22 DIAGNOSIS — R29898 Other symptoms and signs involving the musculoskeletal system: Secondary | ICD-10-CM

## 2020-09-22 DIAGNOSIS — R200 Anesthesia of skin: Secondary | ICD-10-CM

## 2020-10-03 NOTE — Progress Notes (Signed)
PA started for CT head and neck need to fill out clinical review and send over notes.

## 2020-10-31 ENCOUNTER — Ambulatory Visit
Admission: RE | Admit: 2020-10-31 | Discharge: 2020-10-31 | Disposition: A | Discharge status: Home / Self Care | Payer: Medicare HMO | Source: Ambulatory Visit | Attending: Neurology | Admitting: Neurology

## 2020-10-31 DIAGNOSIS — I6503 Occlusion and stenosis of bilateral vertebral arteries: Secondary | ICD-10-CM | POA: Diagnosis not present

## 2020-10-31 DIAGNOSIS — G459 Transient cerebral ischemic attack, unspecified: Secondary | ICD-10-CM

## 2020-10-31 DIAGNOSIS — I6521 Occlusion and stenosis of right carotid artery: Secondary | ICD-10-CM

## 2020-10-31 DIAGNOSIS — I6621 Occlusion and stenosis of right posterior cerebral artery: Secondary | ICD-10-CM | POA: Diagnosis not present

## 2020-10-31 DIAGNOSIS — I672 Cerebral atherosclerosis: Secondary | ICD-10-CM | POA: Diagnosis not present

## 2020-10-31 DIAGNOSIS — I6523 Occlusion and stenosis of bilateral carotid arteries: Secondary | ICD-10-CM | POA: Diagnosis not present

## 2020-10-31 MED ORDER — IOPAMIDOL (ISOVUE-370) INJECTION 76%
75.0000 mL | Freq: Once | INTRAVENOUS | Status: AC | PRN
  Administered 2020-10-31: 75 mL via INTRAVENOUS

## 2020-11-05 NOTE — Progress Notes (Signed)
Pt advised of CTA results. Pt wanted to know if he could a refill on the Plavix 75 mg? He is low only a few days left.

## 2020-11-27 ENCOUNTER — Other Ambulatory Visit: Payer: Self-pay | Admitting: Family Medicine

## 2020-11-27 DIAGNOSIS — E119 Type 2 diabetes mellitus without complications: Secondary | ICD-10-CM

## 2020-11-27 DIAGNOSIS — I10 Essential (primary) hypertension: Secondary | ICD-10-CM

## 2020-12-31 DIAGNOSIS — H401131 Primary open-angle glaucoma, bilateral, mild stage: Secondary | ICD-10-CM | POA: Diagnosis not present

## 2021-02-05 ENCOUNTER — Other Ambulatory Visit: Payer: Self-pay | Admitting: Family Medicine

## 2021-02-05 DIAGNOSIS — E785 Hyperlipidemia, unspecified: Secondary | ICD-10-CM

## 2021-06-24 DIAGNOSIS — Z0100 Encounter for examination of eyes and vision without abnormal findings: Secondary | ICD-10-CM | POA: Diagnosis not present

## 2021-06-24 DIAGNOSIS — E119 Type 2 diabetes mellitus without complications: Secondary | ICD-10-CM | POA: Diagnosis not present

## 2021-06-24 DIAGNOSIS — H401131 Primary open-angle glaucoma, bilateral, mild stage: Secondary | ICD-10-CM | POA: Diagnosis not present

## 2021-06-24 DIAGNOSIS — H524 Presbyopia: Secondary | ICD-10-CM | POA: Diagnosis not present

## 2021-06-24 LAB — HM DIABETES EYE EXAM

## 2021-07-04 ENCOUNTER — Other Ambulatory Visit: Payer: Self-pay | Admitting: Family Medicine

## 2021-07-04 DIAGNOSIS — I10 Essential (primary) hypertension: Secondary | ICD-10-CM

## 2021-07-04 DIAGNOSIS — E119 Type 2 diabetes mellitus without complications: Secondary | ICD-10-CM

## 2021-07-07 ENCOUNTER — Other Ambulatory Visit: Payer: Self-pay | Admitting: *Deleted

## 2021-07-07 DIAGNOSIS — I350 Nonrheumatic aortic (valve) stenosis: Secondary | ICD-10-CM

## 2021-07-09 ENCOUNTER — Emergency Department (HOSPITAL_COMMUNITY)
Admission: EM | Admit: 2021-07-09 | Discharge: 2021-07-09 | Disposition: A | Discharge status: Home / Self Care | Payer: Medicare HMO | Attending: Emergency Medicine | Admitting: Emergency Medicine

## 2021-07-09 ENCOUNTER — Encounter: Payer: Self-pay | Admitting: Family Medicine

## 2021-07-09 ENCOUNTER — Emergency Department (HOSPITAL_COMMUNITY): Payer: Medicare HMO

## 2021-07-09 ENCOUNTER — Ambulatory Visit (INDEPENDENT_AMBULATORY_CARE_PROVIDER_SITE_OTHER): Payer: Medicare HMO | Admitting: Family Medicine

## 2021-07-09 ENCOUNTER — Encounter (HOSPITAL_COMMUNITY): Payer: Self-pay | Admitting: *Deleted

## 2021-07-09 ENCOUNTER — Other Ambulatory Visit: Payer: Self-pay

## 2021-07-09 ENCOUNTER — Telehealth: Payer: Self-pay | Admitting: Cardiology

## 2021-07-09 VITALS — BP 144/82 | HR 81 | Temp 98.0°F | Resp 17 | Ht 72.0 in | Wt 266.4 lb

## 2021-07-09 DIAGNOSIS — Z20822 Contact with and (suspected) exposure to covid-19: Secondary | ICD-10-CM | POA: Insufficient documentation

## 2021-07-09 DIAGNOSIS — R079 Chest pain, unspecified: Secondary | ICD-10-CM

## 2021-07-09 DIAGNOSIS — E785 Hyperlipidemia, unspecified: Secondary | ICD-10-CM | POA: Diagnosis not present

## 2021-07-09 DIAGNOSIS — E876 Hypokalemia: Secondary | ICD-10-CM | POA: Diagnosis not present

## 2021-07-09 DIAGNOSIS — E1169 Type 2 diabetes mellitus with other specified complication: Secondary | ICD-10-CM | POA: Diagnosis not present

## 2021-07-09 DIAGNOSIS — R0602 Shortness of breath: Secondary | ICD-10-CM | POA: Insufficient documentation

## 2021-07-09 DIAGNOSIS — I10 Essential (primary) hypertension: Secondary | ICD-10-CM | POA: Diagnosis not present

## 2021-07-09 DIAGNOSIS — R0609 Other forms of dyspnea: Secondary | ICD-10-CM | POA: Diagnosis not present

## 2021-07-09 DIAGNOSIS — E669 Obesity, unspecified: Secondary | ICD-10-CM | POA: Diagnosis not present

## 2021-07-09 DIAGNOSIS — Z7982 Long term (current) use of aspirin: Secondary | ICD-10-CM | POA: Insufficient documentation

## 2021-07-09 DIAGNOSIS — G459 Transient cerebral ischemic attack, unspecified: Secondary | ICD-10-CM

## 2021-07-09 DIAGNOSIS — R5383 Other fatigue: Secondary | ICD-10-CM

## 2021-07-09 LAB — CBC WITH DIFFERENTIAL/PLATELET
Abs Immature Granulocytes: 0.01 10*3/uL (ref 0.00–0.07)
Basophils Absolute: 0 10*3/uL (ref 0.0–0.1)
Basophils Relative: 0 %
Eosinophils Absolute: 0.1 10*3/uL (ref 0.0–0.5)
Eosinophils Relative: 2 %
HCT: 41.3 % (ref 39.0–52.0)
Hemoglobin: 13.8 g/dL (ref 13.0–17.0)
Immature Granulocytes: 0 %
Lymphocytes Relative: 26 %
Lymphs Abs: 1.3 10*3/uL (ref 0.7–4.0)
MCH: 32.3 pg (ref 26.0–34.0)
MCHC: 33.4 g/dL (ref 30.0–36.0)
MCV: 96.7 fL (ref 80.0–100.0)
Monocytes Absolute: 0.5 10*3/uL (ref 0.1–1.0)
Monocytes Relative: 9 %
Neutro Abs: 3.2 10*3/uL (ref 1.7–7.7)
Neutrophils Relative %: 63 %
Platelets: 179 10*3/uL (ref 150–400)
RBC: 4.27 MIL/uL (ref 4.22–5.81)
RDW: 12.5 % (ref 11.5–15.5)
WBC: 5.1 10*3/uL (ref 4.0–10.5)
nRBC: 0 % (ref 0.0–0.2)

## 2021-07-09 LAB — RESP PANEL BY RT-PCR (FLU A&B, COVID) ARPGX2
Influenza A by PCR: NEGATIVE
Influenza B by PCR: NEGATIVE
SARS Coronavirus 2 by RT PCR: NEGATIVE

## 2021-07-09 LAB — TSH: TSH: 1.28 u[IU]/mL (ref 0.35–5.50)

## 2021-07-09 LAB — BASIC METABOLIC PANEL
Anion gap: 8 (ref 5–15)
BUN: 17 mg/dL (ref 8–23)
CO2: 24 mmol/L (ref 22–32)
Calcium: 8.8 mg/dL — ABNORMAL LOW (ref 8.9–10.3)
Chloride: 110 mmol/L (ref 98–111)
Creatinine, Ser: 0.95 mg/dL (ref 0.61–1.24)
GFR, Estimated: >60 mL/min (ref >60)
Glucose, Bld: 234 mg/dL — ABNORMAL HIGH (ref 70–99)
Potassium: 3.3 mmol/L — ABNORMAL LOW (ref 3.5–5.1)
Sodium: 142 mmol/L (ref 135–145)

## 2021-07-09 LAB — TROPONIN I (HIGH SENSITIVITY)
Troponin I (High Sensitivity): 9 ng/L (ref <18)
Troponin I (High Sensitivity): 9 ng/L (ref <18)

## 2021-07-09 LAB — COMPREHENSIVE METABOLIC PANEL
ALT: 28 U/L (ref 0–53)
AST: 21 U/L (ref 0–37)
Albumin: 4.6 g/dL (ref 3.5–5.2)
Alkaline Phosphatase: 44 U/L (ref 39–117)
BUN: 18 mg/dL (ref 6–23)
CO2: 24 mEq/L (ref 19–32)
Calcium: 9.3 mg/dL (ref 8.4–10.5)
Chloride: 107 mEq/L (ref 96–112)
Creatinine, Ser: 0.84 mg/dL (ref 0.40–1.50)
GFR: 85.71 mL/min (ref >60.00)
Glucose, Bld: 150 mg/dL — ABNORMAL HIGH (ref 70–99)
Potassium: 3.7 mEq/L (ref 3.5–5.1)
Sodium: 141 mEq/L (ref 135–145)
Total Bilirubin: 0.6 mg/dL (ref 0.2–1.2)
Total Protein: 7.3 g/dL (ref 6.0–8.3)

## 2021-07-09 LAB — CBC
HCT: 42.7 % (ref 39.0–52.0)
Hemoglobin: 14.6 g/dL (ref 13.0–17.0)
MCHC: 34.2 g/dL (ref 30.0–36.0)
MCV: 96.1 fl (ref 78.0–100.0)
Platelets: 189 10*3/uL (ref 150.0–400.0)
RBC: 4.45 Mil/uL (ref 4.22–5.81)
RDW: 13.2 % (ref 11.5–15.5)
WBC: 5.5 10*3/uL (ref 4.0–10.5)

## 2021-07-09 LAB — BRAIN NATRIURETIC PEPTIDE: B Natriuretic Peptide: 16.7 pg/mL (ref 0.0–100.0)

## 2021-07-09 LAB — HEMOGLOBIN A1C: Hgb A1c MFr Bld: 7.9 % — ABNORMAL HIGH (ref 4.6–6.5)

## 2021-07-09 LAB — GLUCOSE, POCT (MANUAL RESULT ENTRY): POC Glucose: 156 mg/dl — AB (ref 70–99)

## 2021-07-09 MED ORDER — POTASSIUM CHLORIDE CRYS ER 20 MEQ PO TBCR
40.0000 meq | EXTENDED_RELEASE_TABLET | Freq: Once | ORAL | Status: AC
  Administered 2021-07-09: 40 meq via ORAL
  Filled 2021-07-09: qty 2

## 2021-07-09 NOTE — Progress Notes (Signed)
? ?Subjective:  ?Patient ID: Jeffery George, male    DOB: Sep 30, 1945  Age: 76 y.o. MRN: [id number] ? ?CC:  ?Chief Complaint  ?Patient presents with  ? Fatigue  ?  Pt notes he is sleepy and notes SOB on exertion while working outside, notes has been trying to work in the yard.pt notes this has been constant since having COVID pneumonia   ? ? ?HPI ?Jeffery George presents for  ? ?Fatigue with dyspnea on exertion ?Fatigue past year - since covid in 03/2020 ?Last visit with me in March 2022.  ?Short of breath with any strenuous activity - yardwork, walking up stairs or any physical work. Seems to be getting a little worse in past month, but had some DOE prior. Heartburn sensation in left chest with activity that improves with rest. Soreness in the muscle last week, sometimes inside. Lasts few minutes. ?Some change in breathing moving from chair to exam table in room.   ?No n/v/diaphoresis, no arm/neck/back radiation.  ?No known prior heart issue known.  ?No current chest pain.  ?No prior stress test - had echo as below.  ?No dark stools. No hx of GIB or PUD.  ?No focal weakness, HA, or speech difficulty.  ?Past medical history includes hypertension, prior TIA, diabetes, hyperlipidemia, aortic valve stenosis.  most recent echo in May 2022 with EF greater than 75%, hyperdynamic.  Mild aortic stenosis.  Right ventricular systolic function was normal.  Last visit with cardiology in May 2022.Dr. Quentin Ore, Updated echo ordered for 1 year repeat.  Was continued on amlodipine, losartan for hypertension ? ? ?Neuro eval 09/20/20 - TIA, possible R ICAstenosis. Plan for DAPT for 3 months, then plavix alone. ? Off plavix for some time. Taking aspirin 71m qd. 6 month follow up planned.  ?Has been taking his amlodipine, atorvastatin, losartan, metformin ? ? CTA 11/01/20 - CTA Neck: ? 1. Approximately 50% right and 30-40% left proximal ICA stenosis due ?to atherosclerosis. ?2. Moderate left vertebral artery origin stenosis. ? ?Lab Results   ?Component Value Date  ? HGBA1C 6.1 (H) 07/11/2020  ? ?Lab Results  ?Component Value Date  ? WBC 6.4 04/23/2020  ? HGB 14.0 04/23/2020  ? HCT 40.1 04/23/2020  ? MCV 95.9 04/23/2020  ? PLT 202 04/23/2020  ? ?Lab Results  ?Component Value Date  ? NA 142 07/11/2020  ? K 4.4 07/11/2020  ? CL 104 07/11/2020  ? CO2 21 07/11/2020  ? ?Lab Results  ?Component Value Date  ? CHOL 120 07/11/2020  ? HDL 49 07/11/2020  ? [place]56 07/11/2020  ? TRIG 76 07/11/2020  ? CHOLHDL 2.4 07/11/2020  ? ? ? ? ?History ?Patient Active Problem List  ? Diagnosis Date Noted  ? History of COVID-19 05/08/2020  ? Cough 05/08/2020  ? Rotator cuff syndrome, left 11/15/2018  ? Chronic left shoulder pain 08/09/2018  ? Left retinal detachment 08/11/2017  ? Gout 02/27/2013  ? Essential hypertension, benign 02/27/2013  ? Type II or unspecified type diabetes mellitus without mention of complication, uncontrolled 02/27/2013  ? ?Past Medical History:  ?Diagnosis Date  ? Arthritis   ? [place]   ? Heart murmur   ? Hypertension   ? ?Past Surgical History:  ?Procedure Laterality Date  ? CATARACT EXTRACTION    ? FRACTURE SURGERY    ? Lt ankle  ? GAS/FLUID EXCHANGE Left 08/12/2017  ? Procedure: C3F8 GAS/FLUID EXCHANGE;  Surgeon: ZBernarda Caffey MD;  [place]  Service: [place];  Laterality: Left;  ? HERNIA REPAIR    ? IRIDOTOMY / IRIDECTOMY Bilateral   ? stents  ? VASECTOMY    ? VITRECTOMY 25 GAUGE WITH SCLERAL BUCKLE Left 08/12/2017  ? Procedure: VITRECTOMY 25 GAUGE WITH SCLERAL BUCKLE WITH ENDOLASER;  Surgeon: Bernarda Caffey, MD;  Location: [place];  Service: Ophthalmology;  Laterality: Left;  ? ?No Known Allergies ?Prior to Admission medications   ?Medication Sig Start Date End Date Taking? Authorizing Provider  ?ACCU-CHEK SOFTCLIX LANCETS lancets Test blood sugar once daily. Dx E11.9 08/26/15  Yes [name], MD  ?amLODipine (NORVASC) 5 MG tablet TAKE 1 TABLET EVERY DAY 07/04/21  Yes [name], MD   ?aspirin EC 81 MG tablet Take 1 tablet (81 mg total) by mouth daily. 02/27/13  Yes Shawnee Knapp, MD  ?atorvastatin (LIPITOR) 10 MG tablet TAKE 1 TABLET EVERY DAY 02/06/21  Yes [name], MD  ?blood glucose meter kit and supplies TrueMetrix, TruTrack, Accu-Chek Aviva Expert, Accu-Chek Aviva Plus, Accu-Chek Guide, Accu-Chek Nano, or Accu-Chek Smartview.  ? ?Use once per day. 07/31/17  Yes [name], MD  ?Blood Glucose Monitoring Suppl (BLOOD GLUCOSE MONITOR KIT) KIT Use to test blood sugar twice daily. 07/17/13  Yes [name], MD  ?colchicine (COLCRYS) 0.6 MG tablet Take 2 tabs po at symptom onset. Repeat 1 tab po 1 hour later. 06/22/17  Yes [name], MD  ?Lancets [facility] ULTRASOFT) lancets Use as instructed 06/05/13  Yes [name], MD  ?losartan (COZAAR) 100 MG tablet TAKE 1 TABLET EVERY DAY 07/04/21  Yes [name], MD  ?metFORMIN (GLUCOPHAGE) 500 MG tablet TAKE 1 TABLET EVERY DAY WITH BREAKFAST 07/04/21  Yes [name], MD  ?Omega-3 Fatty Acids (FISH OIL) 1000 MG CAPS Take by mouth daily.    Yes [provider]  ?TRUE METRIX BLOOD GLUCOSE TEST test strip TEST ONE TIME DAILY 03/11/18  Yes [name], MD  ?TRUEPLUS LANCETS 33G [place] TEST ONE TIME DAILY 03/11/18  Yes [name], MD  ?clopidogrel (PLAVIX) 75 MG tablet Take 1 tablet (75 mg total) by mouth daily. ?Patient not taking: Reported on 07/09/2021 09/20/20   Pieter Partridge, DO  ?latanoprost (XALATAN) 0.005 % ophthalmic solution Place 1 drop into both eyes every evening.  ?Patient not taking: Reported on 07/09/2021 07/23/17   [provider]  ? ?Social History  ? ?Socioeconomic History  ? Marital status: Married  ?  Spouse name: Not on file  ? Number of children: Not on file  ? Years of education: Not on file  ? Highest education level: Not on file  ?Occupational History  ? Occupation: Educational psychologist  ?Tobacco Use  ? Smoking status: Former  ? Smokeless tobacco: Never  ?Substance and  Sexual Activity  ? Alcohol use: No  ?  Alcohol/week: 0.0 standard drinks  ? Drug use: No  ? Sexual activity: Never  ?Other Topics Concern  ? Not on file  ?Social History Narrative  ? Married  ? Dispatcher at [employer]  ? ?Social Determinants of Health  ? ?Financial Resource Strain: Not on file  ?Food Insecurity: Not on file  ?Transportation Needs: Not on file  ?Physical Activity: Not on file  ?Stress: Not on file  ?Social Connections: Not on file  ?Intimate Partner Violence: Not on file  ? ? ?Review of Systems ?Per HPI.  ? ?Objective:  ? ?Vitals:  ? 07/09/21 1155  ?BP: (!) 144/82  ?Pulse:  81  ?Resp: 17  ?Temp: 98 ?F (36.7 ?C)  ?TempSrc: Temporal  ?SpO2: 95%  ?Weight: 266 lb 6.4 oz (120.8 kg)  ?Height: 6' (1.829 m)  ? ? ?Physical Exam ?Vitals reviewed.  ?Constitutional:   ?   General: He is not in acute distress. ?   Appearance: Normal appearance. He is well-developed. He is not ill-appearing, toxic-appearing or diaphoretic.  ?HENT:  ?   Head: Normocephalic and atraumatic.  ?Neck:  ?   Vascular: No carotid bruit or JVD.  ?Cardiovascular:  ?   Rate and Rhythm: Normal rate and regular rhythm.  ?   Heart sounds: Murmur (systolic - 2/6) heard.  ?Pulmonary:  ?   Effort: Pulmonary effort is normal.  ?   Breath sounds: Normal breath sounds. No wheezing, rhonchi or rales.  ?Musculoskeletal:  ?   Right lower leg: No edema.  ?   Left lower leg: No edema.  ?Skin: ?   General: Skin is warm and dry.  ?Neurological:  ?   General: No focal deficit present.  ?   Mental Status: He is alert and oriented to person, place, and time. Mental status is at baseline.  ?Psychiatric:     ?   Mood and Affect: Mood normal.     ?   Behavior: Behavior normal.  ? ?EKG, sinus rhythm, rate 82.  No apparent acute ST, T wave changes.  Compared to 08/16/2020 EKG.  No appreciable significant changes. ? ?79 minutes spent during visit, including chart review, counseling and assimilation of information, exam, discussion of plan, and chart  completion.  ? ? ?Assessment & Plan:  ?AGOSTINO GORIN is a 76 y.o. male . ?DOE (dyspnea on exertion) - Plan: EKG 12-Lead, Ambulatory referral to Cardiology, Pro b natriuretic peptide ? ?Left-sided chest pain - Pla

## 2021-07-09 NOTE — ED Provider Notes (Signed)
?[facility] ?Provider Note ? ? ?CSN: [id number] ?Arrival date & time: 07/09/21  1517 ? ?  ? ?History ? ?Chief Complaint  ?Patient presents with  ? Shortness of Breath  ? ? ?Jeffery George is a 76 y.o. male. ? ?Patient presents chief complaint of shortness of breath has been intermittent for about a year.  He finally saw his primary care doctor today who sent him to the ER.  Patient describes his no pain or discomfort at this time but when he exerts himself he gets short of breath.  He had a spasm of chest pain here in the ER that lasted about 1 or 2 seconds and has since not recurred.  Denies any fall or trauma no reports of fevers or cough or vomiting or diarrhea.  Denies any other episodes of chest pain or chest pressure. ? ? ?  ? ?Home Medications ?Prior to Admission medications   ?Medication Sig Start Date End Date Taking? Authorizing Provider  ?ACCU-CHEK SOFTCLIX LANCETS lancets Test blood sugar once daily. Dx E11.9 08/26/15   [name], MD  ?amLODipine (NORVASC) 5 MG tablet TAKE 1 TABLET EVERY DAY 07/04/21   [name], MD  ?aspirin EC 81 MG tablet Take 1 tablet (81 mg total) by mouth daily. 02/27/13   Shawnee Knapp, MD  ?atorvastatin (LIPITOR) 10 MG tablet TAKE 1 TABLET EVERY DAY 02/06/21   [name], MD  ?blood glucose meter kit and supplies TrueMetrix, TruTrack, Accu-Chek Aviva Expert, Accu-Chek Aviva Plus, Accu-Chek Guide, Accu-Chek Nano, or Accu-Chek Smartview.  ? ?Use once per day. 07/31/17   [name], MD  ?Blood Glucose Monitoring Suppl (BLOOD GLUCOSE MONITOR KIT) KIT Use to test blood sugar twice daily. 07/17/13   [name], MD  ?clopidogrel (PLAVIX) 75 MG tablet Take 1 tablet (75 mg total) by mouth daily. ?Patient not taking: Reported on 07/09/2021 09/20/20   Pieter Partridge, DO  ?colchicine (COLCRYS) 0.6 MG tablet Take 2 tabs po at symptom onset. Repeat 1 tab po 1 hour later. 06/22/17   [name], MD  ?Lancets [facility]  ULTRASOFT) lancets Use as instructed 06/05/13   [name], MD  ?latanoprost (XALATAN) 0.005 % ophthalmic solution Place 1 drop into both eyes every evening.  ?Patient not taking: Reported on 07/09/2021 07/23/17   [provider]  ?losartan (COZAAR) 100 MG tablet TAKE 1 TABLET EVERY DAY 07/04/21   [name], MD  ?metFORMIN (GLUCOPHAGE) 500 MG tablet TAKE 1 TABLET EVERY DAY WITH BREAKFAST 07/04/21   [name], MD  ?Omega-3 Fatty Acids (FISH OIL) 1000 MG CAPS Take by mouth daily.     [provider]  ?TRUE METRIX BLOOD GLUCOSE TEST test strip TEST ONE TIME DAILY 03/11/18   [name], MD  ?TRUEPLUS LANCETS 33G [place] TEST ONE TIME DAILY 03/11/18   [name], MD  ?   ? ?Allergies    ?Patient has no known allergies.   ? ?Review of Systems   ?Review of Systems  ?Constitutional:  Negative for fever.  ?HENT:  Negative for ear pain and sore throat.   ?Eyes:  Negative for pain.  ?Respiratory:  Positive for shortness of breath. Negative for cough.   ?Gastrointestinal:  Negative for abdominal pain.  ?Genitourinary:  Negative for flank pain.  ?Musculoskeletal:  Negative for back pain.  ?Skin:  Negative for color change and rash.  ?Neurological:  Negative for syncope.  ?All other systems  reviewed and are negative. ? ?Physical Exam ?Updated Vital Signs ?BP (!) 156/82   Pulse 62   Temp 98.4 ?F (36.9 ?C)   Resp 17   Ht 6' (1.829 m)   Wt 120.8 kg   SpO2 93%   BMI 36.12 kg/m?  ?Physical Exam ?Constitutional:   ?   Appearance: He is well-developed.  ?HENT:  ?   Head: Normocephalic.  ?   Nose: Nose normal.  ?Eyes:  ?   Extraocular Movements: Extraocular movements intact.  ?Cardiovascular:  ?   Rate and Rhythm: Normal rate.  ?Pulmonary:  ?   Effort: Pulmonary effort is normal.  ?Abdominal:  ?   Tenderness: There is no abdominal tenderness. There is no guarding or rebound.  ?Musculoskeletal:  ?   Right lower leg: No tenderness. No edema.  ?   Left lower leg: No tenderness. No edema.   ?Skin: ?   Coloration: Skin is not jaundiced.  ?Neurological:  ?   Mental Status: He is alert. Mental status is at baseline.  ? ? ?ED Results / Procedures / Treatments   ?Labs ?(all labs ordered are listed, but only abnormal results are displayed) ?Labs Reviewed  ?BASIC METABOLIC PANEL - Abnormal; Notable for the following components:  ?    Result Value  ? Potassium 3.3 (*)   ? Glucose, Bld 234 (*)   ? Calcium 8.8 (*)   ? All other components within normal limits  ?RESP PANEL BY RT-PCR (FLU A&B, COVID) ARPGX2  ?CBC WITH DIFFERENTIAL/PLATELET  ?BRAIN NATRIURETIC PEPTIDE  ?TROPONIN I (HIGH SENSITIVITY)  ?TROPONIN I (HIGH SENSITIVITY)  ? ? ?EKG ?EKG Interpretation ? ?Date/Time:  Wednesday July 09 2021 16:05:44 EDT ?Ventricular Rate:  86 ?PR Interval:  146 ?QRS Duration: 98 ?QT Interval:  388 ?QTC Calculation: 464 ?R Axis:   -12 ?Text Interpretation: Normal sinus rhythm Inferior infarct , age undetermined Abnormal ECG When compared with ECG of 12-Aug-2017 10:39, PREVIOUS ECG IS PRESENT Confirmed by Thamas Jaegers ([id number]) on 07/09/2021 7:04:01 PM ? ?Radiology ?DG Chest 2 View ? ?Result Date: 07/09/2021 ?CLINICAL DATA:  Shortness of breath EXAM: CHEST - 2 VIEW COMPARISON:  Chest radiograph dated May 29, 2020 FINDINGS: The heart size and mediastinal contours are within normal limits. Both lungs are clear. The visualized skeletal structures are unremarkable. IMPRESSION: No active cardiopulmonary disease. Electronically Signed   By: Keane Police D.O.   On: 07/09/2021 16:55   ? ?Procedures ?Procedures  ? ? ?Medications Ordered in ED ?Medications  ?potassium chloride SA (KLOR-CON M) CR tablet 40 mEq (has no administration in time range)  ? ? ?ED Course/ Medical Decision Making/ A&P ?  ?                        ?Medical Decision Making ?Risk ?Prescription drug management. ? ? ?Chart review shows primary care doctor's office visit today for dyspnea on exertion. ? ?Cardiac monitoring shows sinus rhythm. ? ?History obtained from  the patient and wife at bedside. ? ?Work-up included labs CBC within normal limits chemistry normal as well.  Troponin is flat x2.  BNP was normal at 16 chest x-ray is unremarkable 1 view chest no infiltrate effusion or edema. ? ?EKG showing sinus rhythm no ST elevations or depressions noted. ? ?Patient had mildly depleted potassium which was replaced here in the ER.  He has no chest pain no shortness of breath and normal vitals at rest. ? ?Recommending outpatient follow-up with cardiology this week.  Recommend immediate return for worsening symptoms fevers pain chest discomfort or any additional concerns. ? ? ? ? ? ? ? ?Final Clinical Impression(s) / ED Diagnoses ?Final diagnoses:  ?SOB (shortness of breath)  ? ? ?Rx / DC Orders ?ED Discharge Orders   ? ?      Ordered  ?  Ambulatory referral to Cardiology       ? 07/09/21 2008  ? ?  ?  ? ?  ? ? ?  ?Luna Fuse, MD ?07/09/21 2008 ? ?

## 2021-07-09 NOTE — ED Triage Notes (Addendum)
The pt is c/o  shortness of breath for over a year  no pain at present    he was sent here for treatment by his local medical doctor  who saw him earlier today ?

## 2021-07-09 NOTE — Telephone Encounter (Signed)
Dr. Carlota Raspberry left his number for Dr. Marlou Porch, DOD, to call him back to discuss patient. Patient in office at this time, and Dr. Carlota Raspberry waiting to discuss patient's disposition before letting him leave the office. ?

## 2021-07-09 NOTE — Discharge Instructions (Signed)
Call the cardiologist office this week to schedule an appointment.  Make sure you let them know you were sent from the ER.  Avoid strenuous activity while awaiting cardiology evaluation. ? ? ?Return immediately back to the ER if: ? ?Your symptoms worsen within the next 12-24 hours. ?You develop new symptoms such as new fevers, persistent vomiting, new pain, shortness of breath, or new weakness or numbness, or if you have any other concerns. ? ?

## 2021-07-09 NOTE — Patient Instructions (Addendum)
I will check some labs but I am concerned about your symptoms including the progression of the shortness of breath with even minimal activity.  This can indicate angina or blocked arteries of the heart and with the minimal amount of effort to cause it could be unstable.  I recommend evaluation through the emergency room today as you will likely need further testing including possible cardiac catheterization.  This can be discussed with the cardiology team in the ER.  After hospital evaluation, follow-up with me and we can discuss your medications and further work-up if needed.,  CMP, no focal new neurologic symptoms but likely will need to start Plavix again.  Can be discussed after ER evaluation.  See below.  Likely will need to restart Plavix but can be discussed after evaluation through the ER for possible anginal symptoms below. ? ?Please go to [facility] ER now.  ?

## 2021-07-09 NOTE — ED Provider Triage Note (Signed)
Emergency Medicine Provider Triage Evaluation Note ? ?Jeffery George , a 76 y.o. male  was evaluated in triage.   ?Patient presents to the ED after request from his primary care doctor.  Apparently has been dealing with some shortness of breath with exertion over the past year.  Says it is acutely worsened over the past 3 days.  He only has it with exertion.  He says he also has been having exertional chest pain over the past week.  It tends to get better with rest.  He does not typically have it unless he is exerting himself.  He denies any new fevers, coughs, abdominal pain, nausea, vomiting, visual changes, leg swelling, or dizziness. ?Hx of CVA, Aortic Stenosis with heart murmur, DM, and HTN ? ?Review of Systems  ?Positive: Chest pain, shortness of breath ?Negative:  ? ?Physical Exam  ?BP (!) 177/80   Pulse 88   Temp 98.4 ?F (36.9 ?C)   Resp 18   Ht 6' (1.829 m)   Wt 120.8 kg   SpO2 96%   BMI 36.12 kg/m?  ?Gen:   Awake, no distress   ?Resp:  Normal effort  ?MSK:   Moves extremities without difficulty  ?Other:  Heart RRR, murmur +. Lungs CTA. Legs without swelling. Abd soft, nontender ? ?Medical Decision Making  ?Medically screening exam initiated at 3:56 PM.  Appropriate orders placed.  Jeffery George was informed that the remainder of the evaluation will be completed by another provider, this initial triage assessment does not replace that evaluation, and the importance of remaining in the ED until their evaluation is complete. ? ?Concern for exertional chest pain. Work up for ACS ?  ?Adolphus Birchwood, PA-C ?07/09/21 1558 ? ?

## 2021-07-09 NOTE — ED Notes (Signed)
Pt verbalized understanding of d/c instructions, meds, and followup care. Denies questions. VSS, no distress noted. Steady gait to exit with all belongings.  ?

## 2021-07-09 NOTE — Telephone Encounter (Signed)
Intermittent left sided chest pain and sob w/ exertion.. concerned about unstable angina ?

## 2021-07-10 LAB — PRO B NATRIURETIC PEPTIDE: NT-Pro BNP: 44 pg/mL (ref 0–376)

## 2021-07-10 NOTE — Telephone Encounter (Signed)
Dr. Marlou Porch discussed with Dr. Carlota Raspberry.  Pt was seen in ED yesterday.  ?

## 2021-07-17 ENCOUNTER — Telehealth: Payer: Self-pay

## 2021-07-17 NOTE — Telephone Encounter (Signed)
Pt scheduled  

## 2021-07-17 NOTE — Telephone Encounter (Signed)
-----   Message from [name], MD sent at 07/15/2021 11:52 AM EDT ----- ?Results sent by MyChart but p of his dyspnea with exertion as I may need to have him see cardiology sooner.  Lease call and check status.  Also needs appointment in the next 2 weeks to review med changes. ? ?

## 2021-07-17 NOTE — Telephone Encounter (Signed)
Please call and book on Friday for 2 week recheck  ?I dont have override I am sorry  ?

## 2021-07-25 ENCOUNTER — Encounter: Payer: Self-pay | Admitting: Family Medicine

## 2021-07-25 ENCOUNTER — Ambulatory Visit (INDEPENDENT_AMBULATORY_CARE_PROVIDER_SITE_OTHER): Payer: Medicare HMO | Admitting: Family Medicine

## 2021-07-25 VITALS — BP 138/72 | HR 76 | Temp 98.2°F | Resp 17 | Ht 72.0 in | Wt 265.4 lb

## 2021-07-25 DIAGNOSIS — E669 Obesity, unspecified: Secondary | ICD-10-CM

## 2021-07-25 DIAGNOSIS — E1165 Type 2 diabetes mellitus with hyperglycemia: Secondary | ICD-10-CM | POA: Diagnosis not present

## 2021-07-25 DIAGNOSIS — R0609 Other forms of dyspnea: Secondary | ICD-10-CM | POA: Diagnosis not present

## 2021-07-25 DIAGNOSIS — G459 Transient cerebral ischemic attack, unspecified: Secondary | ICD-10-CM

## 2021-07-25 DIAGNOSIS — E1169 Type 2 diabetes mellitus with other specified complication: Secondary | ICD-10-CM

## 2021-07-25 DIAGNOSIS — E876 Hypokalemia: Secondary | ICD-10-CM

## 2021-07-25 LAB — MICROALBUMIN / CREATININE URINE RATIO
Creatinine,U: 146 mg/dL
Microalb Creat Ratio: 2.1 mg/g (ref 0.0–30.0)
Microalb, Ur: 3 mg/dL — ABNORMAL HIGH (ref 0.0–1.9)

## 2021-07-25 LAB — GLUCOSE, POCT (MANUAL RESULT ENTRY): POC Glucose: 196 mg/dl — AB (ref 70–99)

## 2021-07-25 LAB — BASIC METABOLIC PANEL
BUN: 17 mg/dL (ref 6–23)
CO2: 24 mEq/L (ref 19–32)
Calcium: 9.3 mg/dL (ref 8.4–10.5)
Chloride: 106 mEq/L (ref 96–112)
Creatinine, Ser: 0.84 mg/dL (ref 0.40–1.50)
GFR: 85.68 mL/min (ref >60.00)
Glucose, Bld: 178 mg/dL — ABNORMAL HIGH (ref 70–99)
Potassium: 4 mEq/L (ref 3.5–5.1)
Sodium: 139 mEq/L (ref 135–145)

## 2021-07-25 MED ORDER — METFORMIN HCL 500 MG PO TABS: 500.0000 mg | ORAL_TABLET | Freq: Two times a day (BID) | ORAL | 1 refills | Status: DC

## 2021-07-25 MED ORDER — CLOPIDOGREL BISULFATE 75 MG PO TABS: 75.0000 mg | ORAL_TABLET | Freq: Every day | ORAL | 1 refills | Status: DC

## 2021-07-25 NOTE — Progress Notes (Addendum)
? ?Subjective:  ?Patient ID: Jeffery George, male    DOB: Jan 13, 1946  Age: 76 y.o. MRN: [id number] ? ?CC:  ?Chief Complaint  ?Patient presents with  ? Shortness of Breath  ?  Pt was sent to ED last visit due to [place] notes has been doing well no med changes in ED  ? Diabetes  ?  Pt returns today for possible med changes due to increasing BG   ? ? ?HPI ?Jeffery George presents for  ? ?Dyspnea on exertion ?DOE and fatigue over the past year but progressive dyspnea exertion at his last visit March 29 with any strenuous activity including walking stairs or physical work.  Had noted some worsening symptoms over the prior month but had preceding dyspnea on exertion.  Heartburn sensation in left chest with activity that improves with rest.  Was having some dyspnea moving from chair to exam table in the room last visit.  EKG without apparent acute changes.  Discussed with cardiology.  He was sent to ER for evaluation, concern for possible unstable angina.  Also restarted Plavix, aspirin with history of TIA. ?proBNP was normal, CBC was normal, TSH was normal.  Glucose 156, A1c 7.9, glucose 151 CMP otherwise normal. ? ?ER note reviewed from 07/09/2021 normal troponin, normal BNP, CBC, respiratory panel for influenza, COVID, repeat troponin normal, chest x-ray no active cardiopulmonary disease.  Potassium borderline low at 3.3, repleted in the ER with 40 mEq potassium chloride.  EKG reported as sinus rhythm without ST elevation or depression.  Planned outpatient follow-up with cardiology within 1 week. ? ?Appointment scheduled with cardiologist May 16th.  ?No chest pains since the ER visit. Heart flutter sensation at times with prior pain.no recent fluttering sensation since ER visit.  ?Still short of breath with activity, but not as bad as last visit. Still short of breath with any stairs or physical activity. Short of breath walking to mailbox and back. No chest pain with this activity.  ?No blood in stools or bleeding. Still  taking asa $RemoveBe'81mg'cvTKbgyRf$  QD.  ?Home BP 130's.  ?Taking potassium supplement daily.  ? ?Diabetes: ?With hyperglycemia, decreased control by most recent A1c. ?Currently taking metformin 500 mg daily. ?He is on statin, ARB. ?Home numbers  - no fasting, postprandial 148-210.  ?Microalbumin: Normal 9/21 - plan recheck at 6 week visit.  ?Optho, foot exam, pneumovax:  ?Optho appt about a week or two ago.  ?Results for orders placed or performed in visit on 07/25/21  ?POCT glucose (manual entry)  ?Result Value Ref Range  ? POC Glucose 196 (A) 70 - 99 mg/dl  ? ?Diabetic Foot Exam - Simple   ?Simple Foot Form ?Visual Inspection ?No deformities, no ulcerations, no other skin breakdown bilaterally: Yes ?See comments: Yes ?Sensation Testing ?Intact to touch and monofilament testing bilaterally: Yes ?Pulse Check ?Posterior Tibialis and Dorsalis pulse intact bilaterally: Yes ?Comments ?Some scar tissue from previous ankle surgery is prominent but pt reports not bothersome, his 2nd toe of the Lt foot is currently black under the nail due to object dropped on this reports has been healing without issue and no concerns from pt  ?  ? ? ? ? ?Lab Results  ?Component Value Date  ? HGBA1C 7.9 (H) 07/09/2021  ? HGBA1C 6.1 (H) 07/11/2020  ? HGBA1C 6.2 (H) 12/20/2019  ? ?Lab Results  ?Component Value Date  ? MICROALBUR 0.6 12/10/2014  ? [place] 56 07/11/2020  ? CREATININE 0.95 07/09/2021  ? ? ? ? ? ? ?History ?  Patient Active Problem List  ? Diagnosis Date Noted  ? History of COVID-19 05/08/2020  ? Cough 05/08/2020  ? Rotator cuff syndrome, left 11/15/2018  ? Chronic left shoulder pain 08/09/2018  ? Left retinal detachment 08/11/2017  ? Gout 02/27/2013  ? Essential hypertension, benign 02/27/2013  ? Type II or unspecified type diabetes mellitus without mention of complication, uncontrolled 02/27/2013  ? ?Past Medical History:  ?Diagnosis Date  ? Arthritis   ? Diabetes mellitus without complication ([place])   ? Heart murmur   ? Hypertension   ? ?Past  Surgical History:  ?Procedure Laterality Date  ? CATARACT EXTRACTION    ? FRACTURE SURGERY    ? Lt ankle  ? GAS/FLUID EXCHANGE Left 08/12/2017  ? Procedure: [id number] GAS/FLUID EXCHANGE;  Surgeon: Bernarda Caffey, MD;  Location: [facility];  Service: Ophthalmology;  Laterality: Left;  ? HERNIA REPAIR    ? IRIDOTOMY / IRIDECTOMY Bilateral   ? stents  ? VASECTOMY    ? VITRECTOMY 25 GAUGE WITH SCLERAL BUCKLE Left 08/12/2017  ? Procedure: VITRECTOMY 25 GAUGE WITH SCLERAL BUCKLE WITH ENDOLASER;  Surgeon: Bernarda Caffey, MD;  Location: [place];  Service: Ophthalmology;  Laterality: Left;  ? ?No Known Allergies ?Prior to Admission medications   ?Medication Sig Start Date End Date Taking? Authorizing Provider  ?ACCU-CHEK SOFTCLIX LANCETS lancets Test blood sugar once daily. Dx E11.9 08/26/15   [name], MD  ?amLODipine (NORVASC) 5 MG tablet TAKE 1 TABLET EVERY DAY 07/04/21   [name], MD  ?aspirin EC 81 MG tablet Take 1 tablet (81 mg total) by mouth daily. 02/27/13   Shawnee Knapp, MD  ?atorvastatin (LIPITOR) 10 MG tablet TAKE 1 TABLET EVERY DAY 02/06/21   [name], MD  ?blood glucose meter kit and supplies TrueMetrix, TruTrack, Accu-Chek Aviva Expert, Accu-Chek Aviva Plus, Accu-Chek Guide, Accu-Chek Nano, or Accu-Chek Smartview.  ? ?Use once per day. 07/31/17   [name], MD  ?Blood Glucose Monitoring Suppl (BLOOD GLUCOSE MONITOR KIT) KIT Use to test blood sugar twice daily. 07/17/13   [name], MD  ?clopidogrel (PLAVIX) 75 MG tablet Take 1 tablet (75 mg total) by mouth daily. ?Patient not taking: Reported on 07/09/2021 09/20/20   Pieter Partridge, DO  ?colchicine (COLCRYS) 0.6 MG tablet Take 2 tabs po at symptom onset. Repeat 1 tab po 1 hour later. 06/22/17   [name], MD  ?Lancets [facility] ULTRASOFT) lancets Use as instructed 06/05/13   [name], MD  ?latanoprost (XALATAN) 0.005 % ophthalmic solution Place 1 drop into both eyes every evening.  ?Patient not taking: Reported on 07/09/2021  07/23/17   [provider]  ?losartan (COZAAR) 100 MG tablet TAKE 1 TABLET EVERY DAY 07/04/21   [name], MD  ?metFORMIN (GLUCOPHAGE) 500 MG tablet TAKE 1 TABLET EVERY DAY WITH BREAKFAST 07/04/21   [name], MD  ?Omega-3 Fatty Acids (FISH OIL) 1000 MG CAPS Take by mouth daily.     [provider]  ?TRUE METRIX BLOOD GLUCOSE TEST test strip TEST ONE TIME DAILY 03/11/18   [name], MD  ?TRUEPLUS LANCETS 33G [place] TEST ONE TIME DAILY 03/11/18   [name], MD  ? ?Social History  ? ?Socioeconomic History  ? Marital status: Married  ?  Spouse name: Not on file  ? Number of children: Not on file  ? Years of education: Not on file  ? Highest education level: Not on file  ?Occupational History  ?  Occupation: Educational psychologist  ?Tobacco Use  ? Smoking status: Former  ? Smokeless tobacco: Never  ?Substance and Sexual Activity  ? Alcohol use: No  ?  Alcohol/week: 0.0 standard drinks  ? Drug use: No  ? Sexual activity: Never  ?Other Topics Concern  ? Not on file  ?Social History Narrative  ? Married  ? Dispatcher at [employer]  ? ?Social Determinants of Health  ? ?Financial Resource Strain: Not on file  ?Food Insecurity: Not on file  ?Transportation Needs: Not on file  ?Physical Activity: Not on file  ?Stress: Not on file  ?Social Connections: Not on file  ?Intimate Partner Violence: Not on file  ? ? ?Review of Systems ?Per HPI  ? ?Objective:  ? ?Vitals:  ? 07/25/21 1208  ?BP: 138/72  ?Pulse: 76  ?Resp: 17  ?Temp: 98.2 ?F (36.8 ?C)  ?TempSrc: Temporal  ?SpO2: 95%  ?Weight: 265 lb 6.4 oz (120.4 kg)  ?Height: 6' (1.829 m)  ? ? ? ?Physical Exam ?Vitals reviewed.  ?Constitutional:   ?   Appearance: He is well-developed.  ?HENT:  ?   Head: Normocephalic and atraumatic.  ?Neck:  ?   Vascular: No carotid bruit or JVD.  ?Cardiovascular:  ?   Rate and Rhythm: Normal rate and regular rhythm.  ?   Heart sounds: Normal heart sounds. No murmur heard. ?Pulmonary:  ?   Effort:  Pulmonary effort is normal.  ?   Breath sounds: Normal breath sounds. No rales.  ?Musculoskeletal:  ?   Right lower leg: No edema.  ?   Left lower leg: No edema.  ?Skin: ?   General: Skin is warm and dry.  ?Neurologi

## 2021-07-25 NOTE — Patient Instructions (Addendum)
It appears that you should still be on plavix - call Dr. Georgie Chard office to make sure and schedule follow up (recommended 6 month follow up last June).[phone].  I sent a refill of plavix for now.  ? ?Keep follow up with cardiology - will see if they need to see you sooner.  ?Return to the clinic or go to the nearest emergency room if any of your symptoms worsen or new symptoms occur. ?Continue to avoid exertion for now.  ? ?Increase metformin to 554m 2 times per day for now. Recheck with home readings in 6 weeks.  ? ? ?Type 2 Diabetes Mellitus, Self-Care, Adult ?Caring for yourself after you have been diagnosed with type 2 diabetes (type 2 diabetes mellitus) means keeping your blood sugar (glucose) under control with a balance of: ?Nutrition. ?Exercise. ?Lifestyle changes. ?Medicines or insulin, if needed. ?Support from your team of health care providers and others. ?What are the risks? ?Having type 2 diabetes can put you at risk for other long-term (chronic) conditions, such as heart disease and kidney disease. Your health care provider may prescribe medicines to help prevent complications from diabetes. ?How to monitor your blood glucose ? ?Check your blood glucose every day or as often as told by your health care provider. ?Have your A1C (hemoglobin A1C) level checked two or more times a year, or as often as told by your health care provider. ?Your health care provider will set personalized treatment goals for you. Generally, the goal of treatment is to maintain the following blood glucose levels: ?Before meals: 80-130 mg/dL (4.4-7.2 mmol/L). ?After meals: below 180 mg/dL (10 mmol/L). ?A1C level: less than 7%. ?How to manage hyperglycemia and hypoglycemia ?Hyperglycemia symptoms ?Hyperglycemia, also called high blood glucose, occurs when blood glucose is too high. Make sure you know the early signs of hyperglycemia, such as: ?Increased thirst. ?Hunger. ?Feeling very tired. ?Needing to urinate more often than  usual. ?Blurry vision. ?Hypoglycemia symptoms ?Hypoglycemia, also called low blood glucose, occurs with a blood glucose level at or below 70 mg/dL (3.9 mmol/L). Diabetes medicines lower your blood glucose and can cause hypoglycemia. The risk for hypoglycemia increases during or after exercise, during sleep, during illness, and when skipping meals or not eating for a long time (fasting). ?It is important to know the symptoms of hypoglycemia and treat it right away. Always have a 15-gram rapid-acting carbohydrate snack with you to treat low blood glucose. Family members and close friends should also know the symptoms and understand how to treat hypoglycemia, in case you are not able to treat yourself. Symptoms may include: ?Hunger. ?Anxiety. ?Sweating and feeling clammy. ?Dizziness or feeling light-headed. ?Sleepiness. ?Increased heart rate. ?Irritability. ?Tingling or numbness around the mouth, lips, or tongue. ?Restless sleep. ?Severe hypoglycemia is when your blood glucose level is at or below 54 mg/dL (3 mmol/L). ?Severe hypoglycemia is an emergency. Do not wait to see if the symptoms will go away. Get medical help right away. Call your local emergency services (911 in the [place] Do not drive yourself to the hospital. ?If you have severe hypoglycemia and you cannot eat or drink, you may need glucagon. A family member or close friend should learn how to check your blood glucose and how to give you glucagon. Ask your health care provider if you need to have an emergency glucagon kit available. ?Follow these instructions at home: ?Medicines ?Take prescribed insulin or diabetes medicines as told by your health care provider. ?Do not run out of insulin or other diabetes  medicines. Plan ahead so you always have these available. ?If you use insulin, adjust your dosage based on your physical activity and what foods you eat. Your health care provider will tell you how to adjust your dosage. ?Take over-the-counter and  prescription medicines only as told by your health care provider. ?Eating and drinking ? ?What you eat and drink affects your blood glucose and your insulin dosage. Making good choices helps to control your diabetes and prevent other health problems. A healthy meal plan includes eating lean proteins, complex carbohydrates, fresh fruits and vegetables, low-fat dairy products, and healthy fats. ?Make an appointment to see a registered dietitian to help you create an eating plan that is right for you. Make sure that you: ?Follow instructions from your health care provider about eating or drinking restrictions. ?Drink enough fluid to keep your urine pale yellow. ?Keep a record of the carbohydrates that you eat. Do this by reading food labels and learning the standard serving sizes of foods. ?Follow your sick-day plan whenever you cannot eat or drink as usual. Make this plan in advance with your health care provider. ? ?Activity ?Stay active. Exercise regularly, as told by your health care provider. This may include: ?Stretching and doing strength exercises, such as yoga or weight lifting, two or more times a week. ?Doing 150 minutes or more of moderate-intensity or vigorous-intensity exercise each week. This could be brisk walking, biking, or water aerobics. ?Spread out your activity over 3 or more days of the week. ?Do not go more than 2 days in a row without doing some kind of physical activity. ?When you start a new exercise or activity, work with your health care provider to adjust your insulin, medicines, or food intake as needed. ?Lifestyle ?Do not use any products that contain nicotine or tobacco. These products include cigarettes, chewing tobacco, and vaping devices, such as e-cigarettes. If you need help quitting, ask your health care provider. ?If you drink alcohol and your health care provider says that it is safe for you: ?Limit how much you have to: ?0-1 drink a day for women who are not pregnant. ?0-2  drinks a day for men. ?Know how much alcohol is in your drink. In the [place] one drink equals one 12 oz bottle of beer (355 mL), one 5 oz glass of wine (148 mL), or one 1? oz glass of hard liquor (44 mL). ?Learn to manage stress. If you need help with this, ask your health care provider. ?Take care of your body ? ?Keep your immunizations up to date. In addition to getting vaccinations as told by your health care provider, it is recommended that you get vaccinated against the following illnesses: ?The flu (influenza). Get a flu shot every year. ?Pneumonia. ?Hepatitis B. ?Schedule an eye exam soon after your diagnosis, and then one time every year after that. ?Check your skin and feet every day for cuts, bruises, redness, blisters, or sores. Schedule a foot exam with your health care provider once every year. ?Brush your teeth and gums two times a day, and floss one or more times a day. Visit your dentist one or more times every 6 months. ?Maintain a healthy weight. ?General instructions ?Share your diabetes management plan with people in your workplace, school, and household. ?Carry a medical alert card or wear medical alert jewelry. ?Keep all follow-up visits. This is important. ?Questions to ask your health care provider ?Should I meet with a certified diabetes care and education specialist? ?Where can I find  a support group for people with diabetes? ?Where to find more information ?For help and guidance and for more information about diabetes, please visit: ?American Diabetes Association (ADA): www.diabetes.org ?American Association of Diabetes Care and Education Specialists (ADCES): www.diabeteseducator.org ?International Diabetes Federation (IDF): MemberVerification.ca ?Summary ?Caring for yourself after you have been diagnosed with type 2 diabetes (type 2 diabetes mellitus) means keeping your blood sugar (glucose) under control with a balance of nutrition, exercise, lifestyle changes, and medicine. ?Check your blood  glucose every day, as often as told by your health care provider. ?Having diabetes can put you at risk for other long-term (chronic) conditions, such as heart disease and kidney disease. Your health care provider

## 2021-08-05 ENCOUNTER — Other Ambulatory Visit: Payer: Self-pay

## 2021-08-05 ENCOUNTER — Encounter: Payer: Self-pay | Admitting: Registered Nurse

## 2021-08-05 ENCOUNTER — Ambulatory Visit (INDEPENDENT_AMBULATORY_CARE_PROVIDER_SITE_OTHER): Payer: Medicare HMO | Admitting: Registered Nurse

## 2021-08-05 VITALS — BP 176/72 | HR 80 | Temp 98.3°F | Resp 18 | Ht 72.0 in | Wt 265.4 lb

## 2021-08-05 DIAGNOSIS — E1169 Type 2 diabetes mellitus with other specified complication: Secondary | ICD-10-CM | POA: Diagnosis not present

## 2021-08-05 DIAGNOSIS — E669 Obesity, unspecified: Secondary | ICD-10-CM

## 2021-08-05 LAB — GLUCOSE, POCT (MANUAL RESULT ENTRY): POC Glucose: 192 mg/dl — AB (ref 70–99)

## 2021-08-05 MED ORDER — TRUE METRIX BLOOD GLUCOSE TEST VI STRP: 1.0000 | ORAL_STRIP | Freq: Three times a day (TID) | 5 refills | Status: DC

## 2021-08-05 MED ORDER — TRUEPLUS LANCETS 33G MISC: 1.0000 | Freq: Three times a day (TID) | 5 refills | Status: AC

## 2021-08-05 MED ORDER — EMPAGLIFLOZIN 25 MG PO TABS: 25.0000 mg | ORAL_TABLET | Freq: Every day | ORAL | 0 refills | Status: DC

## 2021-08-05 NOTE — Patient Instructions (Addendum)
[name] -  ? ?Great to meet you ? ?Call with concerns or sugars consistently above 250. ? ?Start Jardiance one tab in mornings. Continue metformin '500mg'$  twice daily. ? ?See below for diet guidelines, but it sounds like you're doing a lot of the right stuff. ? ?Keep your follow up appt with Dr. [name]. ? ?Thank you, ? ?[name]  ? ? ? ?If you have lab work done today you will be contacted with your lab results within the next 2 weeks.  If you have not heard from [place] then please contact us. The fastest way to get your results is to register for My Chart. ? ? ?IF you received an x-ray today, you will receive an invoice from [facility]. Please contact [facility] at [phone] with questions or concerns regarding your invoice.  ? ?IF you received labwork today, you will receive an invoice from [facility]. Please contact [facility] at [phone] with questions or concerns regarding your invoice.  ? ?Our billing staff will not be able to assist you with questions regarding bills from these companies. ? ?You will be contacted with the lab results as soon as they are available. The fastest way to get your results is to activate your My Chart account. Instructions are located on the last page of this paperwork. If you have not heard from [place] regarding the results in 2 weeks, please contact this office. ?  ? ? ?

## 2021-08-05 NOTE — Progress Notes (Signed)
? ?Established Patient Office Visit ? ?Subjective:  ?Patient ID: BLANCA THORNTON, male    DOB: 07-27-1945  Age: 76 y.o. MRN: [id number] ? ?CC:  ?Chief Complaint  ?Patient presents with  ? Blood Sugar Problem  ?  Patient states he has been having some elevated Glucose levels. Patient states he is now on double Metformin so he was wondering why it is reading 291. And also still feeling very fatigue.  ? ? ?HPI ?[name] presents for hyperglycemia.  ? ?He was seen by his PCP on 07/25/21 for ER follow up for shob and follow up for t2dm ?He was noted to have had A1c elevated to 7.9 as of 07/09/21, up from 6.1 on 07/11/20. ? ?At that time his metformin was increased to 528m po bid from qd.  ? ?Home sugars since last week have 291 at highest, 200s consistently.  ?Lowest when fasting has been 190.  Usually testing fasting in am, again in evening at 7-8 (1-2 hours post prandial). ? ?He does have follow ups with cardiology tomorrow and 08/26/21. He has not yet scheduled neuro follow up  ? ?He does acknowledge recent stressor of loss of his brother 3 days ago - he has lost both of his brothers in the past 2y144m ?Outpatient Medications Prior to Visit  ?Medication Sig Dispense Refill  ? ACCU-CHEK SOFTCLIX LANCETS lancets Test blood sugar once daily. Dx E11.9 100 each 2  ? amLODipine (NORVASC) 5 MG tablet TAKE 1 TABLET EVERY DAY 90 tablet 1  ? aspirin EC 81 MG tablet Take 1 tablet (81 mg total) by mouth daily.    ? atorvastatin (LIPITOR) 10 MG tablet TAKE 1 TABLET EVERY DAY 90 tablet 2  ? blood glucose meter kit and supplies TrueMetrix, TruTrack, Accu-Chek Aviva Expert, Accu-Chek Aviva Plus, Accu-Chek Guide, Accu-Chek Nano, or Accu-Chek Smartview.  ? ?Use once per day. 1 each 0  ? Blood Glucose Monitoring Suppl (BLOOD GLUCOSE MONITOR KIT) KIT Use to test blood sugar twice daily. 1 each 0  ? clopidogrel (PLAVIX) 75 MG tablet Take 1 tablet (75 mg total) by mouth daily. 30 tablet 1  ? colchicine (COLCRYS) 0.6 MG tablet Take 2 tabs  po at symptom onset. Repeat 1 tab po 1 hour later. 12 tablet 0  ? Lancets (ONETOUCH ULTRASOFT) lancets Use as instructed 100 each 12  ? losartan (COZAAR) 100 MG tablet TAKE 1 TABLET EVERY DAY 90 tablet 1  ? metFORMIN (GLUCOPHAGE) 500 MG tablet Take 1 tablet (500 mg total) by mouth 2 (two) times daily with a meal. 90 tablet 1  ? Omega-3 Fatty Acids (FISH OIL) 1000 MG CAPS Take by mouth daily.     ? TRUE METRIX BLOOD GLUCOSE TEST test strip TEST ONE TIME DAILY 100 each 1  ? TRUEPLUS LANCETS 33G MISC TEST ONE TIME DAILY 100 each 0  ? latanoprost (XALATAN) 0.005 % ophthalmic solution Place 1 drop into both eyes every evening.  (Patient not taking: Reported on 07/09/2021)    ? ?No facility-administered medications prior to visit.  ? ? ?Review of Systems  ?Constitutional: Negative.   ?HENT: Negative.    ?Eyes: Negative.   ?Respiratory: Negative.    ?Cardiovascular: Negative.   ?Gastrointestinal: Negative.   ?Genitourinary: Negative.   ?Musculoskeletal: Negative.   ?Skin: Negative.   ?Neurological: Negative.   ?Psychiatric/Behavioral: Negative.    ?All other systems reviewed and are negative. ? ?  ?Objective:  ?  ? ?BP (!) 176/72   Pulse 80  Temp 98.3 ?F (36.8 ?C) (Temporal)   Resp 18   Ht 6' (1.829 m)   Wt 265 lb 6.4 oz (120.4 kg)   SpO2 99%   BMI 35.99 kg/m?  ? ?Wt Readings from Last 3 Encounters:  ?08/05/21 265 lb 6.4 oz (120.4 kg)  ?07/25/21 265 lb 6.4 oz (120.4 kg)  ?07/09/21 266 lb 5.1 oz (120.8 kg)  ? ?Physical Exam ?Constitutional:   ?   General: He is not in acute distress. ?   Appearance: Normal appearance. He is normal weight. He is not ill-appearing, toxic-appearing or diaphoretic.  ?Cardiovascular:  ?   Rate and Rhythm: Normal rate and regular rhythm.  ?   Heart sounds: Normal heart sounds. No murmur heard. ?  No friction rub. No gallop.  ?Pulmonary:  ?   Effort: Pulmonary effort is normal. No respiratory distress.  ?   Breath sounds: Normal breath sounds. No stridor. No wheezing, rhonchi or rales.   ?Chest:  ?   Chest wall: No tenderness.  ?Neurological:  ?   General: No focal deficit present.  ?   Mental Status: He is alert and oriented to person, place, and time. Mental status is at baseline.  ?Psychiatric:     ?   Mood and Affect: Mood normal.     ?   Behavior: Behavior normal.     ?   Thought Content: Thought content normal.     ?   Judgment: Judgment normal.  ? ? ?Results for orders placed or performed in visit on 08/05/21  ?POCT glucose (manual entry)  ?Result Value Ref Range  ? POC Glucose 192 (A) 70 - 99 mg/dl  ? ? ? ? ?The ASCVD Risk score (Arnett DK, et al., 2019) failed to calculate for the following reasons: ?  The valid total cholesterol range is 130 to 320 mg/dL ? ?  ?Assessment & Plan:  ? ?Problem List Items Addressed This Visit   ?None ?Visit Diagnoses   ? ? Type 2 diabetes mellitus with obesity ([place])    -  Primary  ? Relevant Medications  ? empagliflozin (JARDIANCE) 25 MG TABS tablet  ? glucose blood (TRUE METRIX BLOOD GLUCOSE TEST) test strip  ? TRUEplus Lancets 33G MISC  ? Other Relevant Orders  ? POCT glucose (manual entry) (Completed)  ? ?  ? ? ?Meds ordered this encounter  ?Medications  ? empagliflozin (JARDIANCE) 25 MG TABS tablet  ?  Sig: Take 1 tablet (25 mg total) by mouth daily.  ?  Dispense:  90 tablet  ?  Refill:  0  ?  Order Specific Question:   Supervising Provider  ?  Answer:   Carlota Raspberry, JEFFREY R [[id number]]  ? glucose blood (TRUE METRIX BLOOD GLUCOSE TEST) test strip  ?  Sig: 1 each by Other route 3 (three) times daily. Use as instructed  ?  Dispense:  300 each  ?  Refill:  5  ?  Order Specific Question:   Supervising Provider  ?  Answer:   Carlota Raspberry, JEFFREY R [[id number]]  ? TRUEplus Lancets 33G MISC  ?  Sig: 1 each by Does not apply route in the morning, at noon, and at bedtime.  ?  Dispense:  300 each  ?  Refill:  5  ?  Order Specific Question:   Supervising Provider  ?  Answer:   Carlota Raspberry, JEFFREY R [[id number]]  ? ? ?Return if symptoms worsen or fail to improve, for as scheduled with PCP.   ? ?PLAN ?Glucose  today high to 196.  ?T2dm uncontrolled, Will add jardiance 54m po qd. Reviewed risks, benefits, and side effects, pt voices understanding. ?Check sugars 2-3 times a day, fasting and random. Report if numbers continue to be above 250. ?Consider adding long acting insulin once daily and titrating to effective dose if jardiance doesn't help ?Discussed healthy lifestyle and follow up with neuro. Pt voices undrestanding ?Keep follow up with Dr. GCarlota Raspberryon 09/05/21 ?Patient encouraged to call clinic with any questions, comments, or concerns. ? ? ?RMaximiano Coss NP ?

## 2021-08-06 ENCOUNTER — Encounter: Payer: Self-pay | Admitting: Cardiovascular Disease

## 2021-08-06 ENCOUNTER — Ambulatory Visit: Payer: Medicare HMO | Admitting: Cardiovascular Disease

## 2021-08-06 ENCOUNTER — Ambulatory Visit (HOSPITAL_COMMUNITY): Payer: Medicare HMO | Attending: Cardiology

## 2021-08-06 VITALS — BP 134/78 | HR 92 | Ht 72.0 in | Wt 265.2 lb

## 2021-08-06 DIAGNOSIS — R0789 Other chest pain: Secondary | ICD-10-CM

## 2021-08-06 DIAGNOSIS — I350 Nonrheumatic aortic (valve) stenosis: Secondary | ICD-10-CM | POA: Diagnosis not present

## 2021-08-06 DIAGNOSIS — R079 Chest pain, unspecified: Secondary | ICD-10-CM

## 2021-08-06 LAB — ECHOCARDIOGRAM COMPLETE
AR max vel: 2.05 cm2
AV Area VTI: 2.06 cm2
AV Area mean vel: 2.09 cm2
AV Mean grad: 19 mmHg
AV Peak grad: 33.6 mmHg
Ao pk vel: 2.9 m/s
Area-P 1/2: 2.26 cm2
S' Lateral: 2.7 cm

## 2021-08-06 MED ORDER — PERFLUTREN LIPID MICROSPHERE
1.0000 mL | INTRAVENOUS | Status: AC | PRN
  Administered 2021-08-06: 2 mL via INTRAVENOUS

## 2021-08-06 MED ORDER — CARVEDILOL 6.25 MG PO TABS: 6.2500 mg | ORAL_TABLET | Freq: Two times a day (BID) | ORAL | 3 refills | Status: DC

## 2021-08-06 MED ORDER — METOPROLOL TARTRATE 100 MG PO TABS: ORAL_TABLET | ORAL | 0 refills | Status: DC

## 2021-08-06 NOTE — Patient Instructions (Addendum)
Medication Instructions:  ?START Carvedilol (Coreg) 6.'25mg'$  Twice daily ?*If you need a refill on your cardiac medications before your next appointment, please call your pharmacy* ? ? ?Lab Work: ?NONE ?If you have labs (blood work) drawn today and your tests are completely normal, you will receive your results only by: ?MyChart Message (if you have MyChart) OR ?A paper copy in the mail ?If you have any lab test that is abnormal or we need to change your treatment, we will call you to review the results. ? ? ?Testing/Procedures: ?Coronary CT ?Your physician has requested that you have cardiac CT. Cardiac computed tomography (CT) is a painless test that uses an x-ray machine to take clear, detailed pictures of your heart. For further information please visit [email or web address]. Please follow instruction sheet as given. ? ? ?Follow-Up: ?At [facility], you and your health needs are our priority.  As part of our continuing mission to provide you with exceptional heart care, we have created designated Provider Care Teams.  These Care Teams include your primary Cardiologist (physician) and Advanced Practice Providers (APPs -  Physician Assistants and Nurse Practitioners) who all work together to provide you with the care you need, when you need it. ? ?Your next appointment:   ?1 year(s) ? ?The format for your next appointment:   ?In Person ? ?Provider:   ?Mertie Moores, MD1}  ?Christen Bame, NP ?Richardson Dopp, [place] ?Robbie Lis, PA ? ?Other Instructions ? ? ?Your cardiac CT will be scheduled at one of the below locations:  ? ?[facility] ?[address] ?[place], [place] [address] ?[phone] ? ?Please arrive at the [facility] and Children's Entrance (Entrance C2) of [facility] 30 minutes prior to test start time. ?You can use the FREE valet parking offered at entrance C (encouraged to control the heart rate for the test)  ?Proceed to the [facility] Radiology Department (first floor) to check-in  and test prep. ? ?All radiology patients and guests should use entrance C2 at [facility], accessed from [facility], even though the hospital's physical address listed is [address] ? ? ? ?Please follow these instructions carefully (unless otherwise directed): ? ?Hold all erectile dysfunction medications at least 3 days (72 hrs) prior to test. ? ?On the Night Before the Test: ?Be sure to Drink plenty of water. ?Do not consume any caffeinated/decaffeinated beverages or chocolate 12 hours prior to your test. ?Do not take any antihistamines 12 hours prior to your test. ? ?On the Day of the Test: ?Drink plenty of water until 1 hour prior to the test. ?Do not eat any food 4 hours prior to the test. ?You may take your regular medications prior to the test.  ?Take metoprolol (Lopressor) two hours prior to test. ?     ?After the Test: ?Drink plenty of water. ?After receiving IV contrast, you may experience a mild flushed feeling. This is normal. ?On occasion, you may experience a mild rash up to 24 hours after the test. This is not dangerous. If this occurs, you can take Benadryl 25 mg and increase your fluid intake. ?If you experience trouble breathing, this can be serious. If it is severe call 911 IMMEDIATELY. If it is mild, please call our office. ?If you take any of these medications: Glipizide/Metformin, Avandament, Glucavance, please do not take 48 hours after completing test unless otherwise instructed. ? ?We will call to schedule your test 2-4 weeks out understanding that some insurance companies will need  an authorization prior to the service being performed.  ? ?For non-scheduling related questions, please contact the cardiac imaging nurse navigator should you have any questions/concerns: ?Marchia Bond, Cardiac Imaging Nurse Navigator ?Gordy Clement, Cardiac Imaging Nurse Navigator ?[facility] ?Direct Office Dial: [phone]  ? ?For scheduling needs,  including cancellations and rescheduling, please call [place], [phone]. ? ?  ? ?Important Information About Sugar ? ? ? ? ?  ?

## 2021-08-06 NOTE — Progress Notes (Signed)
?Cardiology Office Note:   ? ?Date:  08/11/2021  ? ?ID:  Jeffery George, DOB 1946-02-24, MRN [id number] ? ?PCP:  Jeffery Agreste, MD ?  ?[facility] Providers ?Cardiologist:  Aayat Hajjar  ? ?Electrophysiologist:  Vickie Epley, MD { ?   ? ?Referring MD: Luna Fuse, MD  ? ?Chief Complaint  ?Patient presents with  ? Aortic Stenosis  ? ? ?History of Present Illness:   ? ?Jeffery George is a 76 y.o. male with a hx of HTN, DM, aortic stenosis ?He was previously seen by Dr. Quentin Ore a year ago.  He was noted to have a heart murmur.  Echocardiogram revealed mild aortic stenosis. ? ?Echocardiogram this morning reveals normal left ventricular systolic function with an EF of 65 to 70%.  There is mild to moderate aortic stenosis with mean aortic valve gradient of 19 mmHg. ? ?Has had some shortness of breath - present since he had covid a year ago  ?Also has chest burning  ?Lots of fatigue  ?Has to stop to rest doing mild exertion  ?Has gained some weight .  30 lbs  ? ?Has had COVID twice .   ?Has not received any COVID vaccines.  ? ?Watches his salt intake  ? ? ?Past Medical History:  ?Diagnosis Date  ? Arthritis   ? Diabetes mellitus without complication ([place])   ? Heart murmur   ? Hypertension   ? ? ?Past Surgical History:  ?Procedure Laterality Date  ? CATARACT EXTRACTION    ? FRACTURE SURGERY    ? Lt ankle  ? GAS/FLUID EXCHANGE Left 08/12/2017  ? Procedure: [id number] GAS/FLUID EXCHANGE;  Surgeon: Bernarda Caffey, MD;  Location: [facility];  Service: Ophthalmology;  Laterality: Left;  ? HERNIA REPAIR    ? IRIDOTOMY / IRIDECTOMY Bilateral   ? stents  ? VASECTOMY    ? VITRECTOMY 25 GAUGE WITH SCLERAL BUCKLE Left 08/12/2017  ? Procedure: VITRECTOMY 25 GAUGE WITH SCLERAL BUCKLE WITH ENDOLASER;  Surgeon: Bernarda Caffey, MD;  Location: [facility] [place];  Service: Ophthalmology;  Laterality: Left;  ? ? ?Current Medications: ?Current Meds  ?Medication Sig  ? ACCU-CHEK SOFTCLIX LANCETS lancets Test blood sugar once daily. Dx E11.9  ? acetaminophen (TYLENOL)  500 MG tablet Take 2 tablets by mouth every 8 (eight) hours as needed.  ? amLODipine (NORVASC) 5 MG tablet TAKE 1 TABLET EVERY DAY  ? aspirin EC 81 MG tablet Take 1 tablet (81 mg total) by mouth daily.  ? atorvastatin (LIPITOR) 10 MG tablet TAKE 1 TABLET EVERY DAY  ? blood glucose meter kit and supplies TrueMetrix, TruTrack, Accu-Chek Aviva Expert, Accu-Chek Aviva Plus, Accu-Chek Guide, Accu-Chek Nano, or Accu-Chek Smartview.  ? ?Use once per day.  ? Blood Glucose Monitoring Suppl (BLOOD GLUCOSE MONITOR KIT) KIT Use to test blood sugar twice daily.  ? carvedilol (COREG) 6.25 MG tablet Take 1 tablet (6.25 mg total) by mouth 2 (two) times daily.  ? clopidogrel (PLAVIX) 75 MG tablet Take 1 tablet (75 mg total) by mouth daily.  ? colchicine (COLCRYS) 0.6 MG tablet Take 2 tabs po at symptom onset. Repeat 1 tab po 1 hour later.  ? empagliflozin (JARDIANCE) 25 MG TABS tablet Take 1 tablet (25 mg total) by mouth daily.  ? glucose blood (TRUE METRIX BLOOD GLUCOSE TEST) test strip 1 each by Other route 3 (three) times daily. Use as instructed  ? Lancets (ONETOUCH ULTRASOFT) lancets Use as instructed  ? latanoprost (XALATAN) 0.005 % ophthalmic solution Place 1 drop into  both eyes every evening.  ? losartan (COZAAR) 100 MG tablet TAKE 1 TABLET EVERY DAY  ? metFORMIN (GLUCOPHAGE) 500 MG tablet Take 1 tablet (500 mg total) by mouth 2 (two) times daily with a meal.  ? metoprolol tartrate (LOPRESSOR) 100 MG tablet Take 1 tablet by mouth two hours prior to CT scan  ? Omega-3 Fatty Acids (FISH OIL) 1000 MG CAPS Take by mouth daily.   ? TRUEplus Lancets 33G MISC 1 each by Does not apply route in the morning, at noon, and at bedtime.  ?  ? ?Allergies:   Patient has no known allergies.  ? ?Social History  ? ?Socioeconomic History  ? Marital status: Married  ?  Spouse name: Not on file  ? Number of children: Not on file  ? Years of education: Not on file  ? Highest education level: Not on file  ?Occupational History  ? Occupation:  Educational psychologist  ?Tobacco Use  ? Smoking status: Former  ? Smokeless tobacco: Never  ?Substance and Sexual Activity  ? Alcohol use: No  ?  Alcohol/week: 0.0 standard drinks  ? Drug use: No  ? Sexual activity: Never  ?Other Topics Concern  ? Not on file  ?Social History Narrative  ? Married  ? Dispatcher at [employer]  ? ?Social Determinants of Health  ? ?Financial Resource Strain: Not on file  ?Food Insecurity: Not on file  ?Transportation Needs: Not on file  ?Physical Activity: Not on file  ?Stress: Not on file  ?Social Connections: Not on file  ?  ? ?Family History: ?The patient's family history includes Cancer in his mother; Diabetes in his brother, brother, and mother; Heart disease in his brother and father; Stroke in his father and mother. There is no history of Colon cancer. ? ?ROS:   ?Please see the history of present illness.    ? All other systems reviewed and are negative. ? ?EKGs/Labs/Other Studies Reviewed:   ? ?The following studies were reviewed today: ? ? ?EKG:   July 09, 2021:  NSR .  NS ST /T abn.  ? ? ?Recent Labs: ?07/09/2021: ALT 28; B Natriuretic Peptide 16.7; Hemoglobin 13.8; NT-Pro BNP 44; Platelets 179; TSH 1.28 ?07/25/2021: BUN 17; Creatinine, Ser 0.84; Potassium 4.0; Sodium 139  ?Recent Lipid Panel ?   ?Component Value Date/Time  ? CHOL 120 07/11/2020 0835  ? TRIG 76 07/11/2020 0835  ? HDL 49 07/11/2020 0835  ? CHOLHDL 2.4 07/11/2020 0835  ? CHOLHDL 2.4 11/25/2015 1131  ? VLDL 26 11/25/2015 1131  ? [place] 56 07/11/2020 0835  ? ? ? ?Risk Assessment/Calculations:   ?  ? ?    ? ?Physical Exam:   ? ?VS:  BP 134/78 (BP Location: Right Arm, Patient Position: Sitting, Cuff Size: Normal)   Pulse 92   Ht 6' (1.829 m)   Wt 265 lb 3.2 oz (120.3 kg)   SpO2 97%   BMI 35.97 kg/m?    ? ?Wt Readings from Last 3 Encounters:  ?08/06/21 265 lb 3.2 oz (120.3 kg)  ?08/05/21 265 lb 6.4 oz (120.4 kg)  ?07/25/21 265 lb 6.4 oz (120.4 kg)  ?  ? ?GEN:  Well nourished, well developed in no acute  distress ?HEENT: Normal ?NECK: No JVD; No carotid bruits ?LYMPHATICS: No lymphadenopathy ?CARDIAC: RRR, 2/6 systolic murmur,  brisk radial pulses. ?RESPIRATORY:  Clear to auscultation without rales, wheezing or rhonchi  ?ABDOMEN: Soft, non-tender, non-distended ?MUSCULOSKELETAL:  No edema; No deformity  ?SKIN: Warm and  dry ?NEUROLOGIC:  Alert and oriented x 3 ?PSYCHIATRIC:  Normal affect  ? ?ASSESSMENT:   ? ?1. Chest tightness   ?2. Nonrheumatic aortic valve stenosis   ?3. Chest pain, unspecified type   ? ?PLAN:   ?  ? ?DOE:   Tushar's been having some episodes of shortness of breath with exertion.  He also has some episodes of chest burning.  He states that the burning feels like indigestion.  It is possible that this is an anginal equivalent. ?I would like to get a coronary CT angiogram for further evaluation of this shortness of breath. ? ?He has known mild to moderate aortic stenosis.  He has fairly brisk pulses and his mean aortic valve gradient is only 19.  I do not think that this is the sole etiology of his shortness of breath with exertion. ? ?He has picked up about 30 pounds since last year.  I suggested that that may be a significant contributor to his shortness of breath with exertion.  He agrees to to work on some weight loss. ? ?Will see him in 1 year . ? ? ?   ? ?   ? ? ?Medication Adjustments/Labs and Tests Ordered: ?Current medicines are reviewed at length with the patient today.  Concerns regarding medicines are outlined above.  ?Orders Placed This Encounter  ?Procedures  ? CT CORONARY MORPH W/CTA COR W/SCORE W/CA W/CM &/OR WO/CM  ? ?Meds ordered this encounter  ?Medications  ? carvedilol (COREG) 6.25 MG tablet  ?  Sig: Take 1 tablet (6.25 mg total) by mouth 2 (two) times daily.  ?  Dispense:  180 tablet  ?  Refill:  3  ? metoprolol tartrate (LOPRESSOR) 100 MG tablet  ?  Sig: Take 1 tablet by mouth two hours prior to CT scan  ?  Dispense:  1 tablet  ?  Refill:  0  ? ? ?Patient Instructions   ?Medication Instructions:  ?START Carvedilol (Coreg) 6.43m Twice daily ?*If you need a refill on your cardiac medications before your next appointment, please call your pharmacy* ? ? ?Lab Work: ?NONE ?If you have labs

## 2021-08-13 ENCOUNTER — Encounter: Payer: Self-pay | Admitting: Registered Nurse

## 2021-08-13 NOTE — Telephone Encounter (Signed)
Pt responded to request for readings  ?

## 2021-08-19 NOTE — Telephone Encounter (Signed)
Pt sent back message about readings, notes concern he should take Jardiance but also concerned he cannot afford Jardiance.  ? ?Please advise  ?

## 2021-08-26 ENCOUNTER — Ambulatory Visit: Payer: Medicare HMO | Admitting: Cardiology

## 2021-08-28 ENCOUNTER — Telehealth (HOSPITAL_COMMUNITY): Payer: Self-pay | Admitting: Emergency Medicine

## 2021-08-28 NOTE — Telephone Encounter (Signed)
Attempted to call patient regarding upcoming cardiac CT appointment. °Left message on voicemail with name and callback number °Chrisotpher Rivero RN Navigator Cardiac Imaging °[facility] °[phone] Office °[phone] Cell ° °

## 2021-08-29 ENCOUNTER — Ambulatory Visit (HOSPITAL_COMMUNITY)
Admission: RE | Admit: 2021-08-29 | Discharge: 2021-08-29 | Disposition: A | Discharge status: Home / Self Care | Payer: Medicare HMO | Source: Ambulatory Visit | Attending: Cardiovascular Disease | Admitting: Cardiovascular Disease

## 2021-08-29 DIAGNOSIS — R079 Chest pain, unspecified: Secondary | ICD-10-CM

## 2021-08-29 DIAGNOSIS — I350 Nonrheumatic aortic (valve) stenosis: Secondary | ICD-10-CM | POA: Diagnosis not present

## 2021-08-29 DIAGNOSIS — R0789 Other chest pain: Secondary | ICD-10-CM | POA: Diagnosis not present

## 2021-08-29 MED ORDER — NITROGLYCERIN 0.4 MG SL SUBL
SUBLINGUAL_TABLET | SUBLINGUAL | Status: AC
  Filled 2021-08-29: qty 1

## 2021-08-29 MED ORDER — IOHEXOL 350 MG/ML SOLN
100.0000 mL | Freq: Once | INTRAVENOUS | Status: AC | PRN
  Administered 2021-08-29: 100 mL via INTRAVENOUS

## 2021-08-29 MED ORDER — NITROGLYCERIN 0.4 MG SL SUBL
0.8000 mg | SUBLINGUAL_TABLET | Freq: Once | SUBLINGUAL | Status: AC
Start: 2021-08-29 — End: 2021-08-29
  Administered 2021-08-29: 0.8 mg via SUBLINGUAL

## 2021-09-05 ENCOUNTER — Ambulatory Visit (INDEPENDENT_AMBULATORY_CARE_PROVIDER_SITE_OTHER): Payer: Medicare HMO | Admitting: Family Medicine

## 2021-09-05 ENCOUNTER — Encounter: Payer: Self-pay | Admitting: Family Medicine

## 2021-09-05 VITALS — BP 148/86 | HR 66 | Temp 98.3°F | Resp 16 | Ht 72.0 in | Wt 266.4 lb

## 2021-09-05 DIAGNOSIS — E669 Obesity, unspecified: Secondary | ICD-10-CM

## 2021-09-05 DIAGNOSIS — G459 Transient cerebral ischemic attack, unspecified: Secondary | ICD-10-CM | POA: Diagnosis not present

## 2021-09-05 DIAGNOSIS — E1169 Type 2 diabetes mellitus with other specified complication: Secondary | ICD-10-CM | POA: Diagnosis not present

## 2021-09-05 DIAGNOSIS — E785 Hyperlipidemia, unspecified: Secondary | ICD-10-CM

## 2021-09-05 LAB — LIPID PANEL
Cholesterol: 124 mg/dL (ref 0–200)
HDL: 39.1 mg/dL (ref >39.00)
LDL Cholesterol: 53 mg/dL (ref 0–99)
NonHDL: 84.56
Total CHOL/HDL Ratio: 3
Triglycerides: 159 mg/dL — ABNORMAL HIGH (ref 0.0–149.0)
VLDL: 31.8 mg/dL (ref 0.0–40.0)

## 2021-09-05 LAB — POCT GLYCOSYLATED HEMOGLOBIN (HGB A1C): Hemoglobin A1C: 7.3 % — AB (ref 4.0–5.6)

## 2021-09-05 LAB — COMPREHENSIVE METABOLIC PANEL
ALT: 32 U/L (ref 0–53)
AST: 20 U/L (ref 0–37)
Albumin: 4.5 g/dL (ref 3.5–5.2)
Alkaline Phosphatase: 40 U/L (ref 39–117)
BUN: 17 mg/dL (ref 6–23)
CO2: 28 mEq/L (ref 19–32)
Calcium: 9.1 mg/dL (ref 8.4–10.5)
Chloride: 105 mEq/L (ref 96–112)
Creatinine, Ser: 0.93 mg/dL (ref 0.40–1.50)
GFR: 80.61 mL/min (ref >60.00)
Glucose, Bld: 158 mg/dL — ABNORMAL HIGH (ref 70–99)
Potassium: 4 mEq/L (ref 3.5–5.1)
Sodium: 140 mEq/L (ref 135–145)
Total Bilirubin: 0.9 mg/dL (ref 0.2–1.2)
Total Protein: 7 g/dL (ref 6.0–8.3)

## 2021-09-05 LAB — GLUCOSE, POCT (MANUAL RESULT ENTRY): POC Glucose: 164 mg/dl — AB (ref 70–99)

## 2021-09-05 MED ORDER — CLOPIDOGREL BISULFATE 75 MG PO TABS: 75.0000 mg | ORAL_TABLET | Freq: Every day | ORAL | 1 refills | Status: DC

## 2021-09-05 MED ORDER — ATORVASTATIN CALCIUM 10 MG PO TABS: 10.0000 mg | ORAL_TABLET | Freq: Every day | ORAL | 2 refills | Status: DC

## 2021-09-05 NOTE — Progress Notes (Signed)
Subjective:  Patient ID: Jeffery George, male    DOB: Feb 09, 1946  Age: 76 y.o. MRN: [id number]  CC:  Chief Complaint  Patient presents with   Diabetes    Pt here for 6 week recheck, notes doing better than last visit    Medication Refill    Pt requested refill plavix    Hyperlipidemia    Needs refill     HPI Jeffery George presents for   Diabetes: With hyperglycemia, was on metformin 500 mg daily at his April visit with me, increased to twice daily.  He is on statin, ARB. He met with my colleague April 25.  Started on Jardiance due to persistent elevated readings - did not take d/t cost.  No new side effects with meds.  Home readings - random/postprandial: 110-172. Recently 116, 128, 110, 132, 129, 136. Better recently.  Off metformin for few days around time of CT, otherwise compliant. Feeling better recently - able to [place] for few hours. Feels a lot better - fatigue, dyspnea resolved.   microalbumin: 3.0 on 07/25/21.  Optho, foot exam, pneumovax:  Optho - in past few months.   Lab Results  Component Value Date   HGBA1C 7.3 (A) 09/05/2021   HGBA1C 7.9 (H) 07/09/2021   HGBA1C 6.1 (H) 07/11/2020   Lab Results  Component Value Date   MICROALBUR 3.0 (H) 07/25/2021   LDLCALC 56 07/11/2020   CREATININE 0.84 07/25/2021   History of TIA Treated with Plavix. Nonadherence in past. Recommended restart at prior visit - did restart. Taking daily, no new bleeding. Has not scheduled appt with neuro.  Plan for neuro follow-up. No new weakness, headaches   Chest tightness/dyspnea: See prior notes.  Was evaluated by cardiology April 26 echo with normal left EF 65 to 70%.  Mild to moderate aortic stenosis with aortic valve gradient 19 mm.  Persistent fatigue and dyspnea since previous COVID infection.  Having to stop to rest even with mild exertion.  Possible anginal equivalent.  Coronary CT angiogram ordered, started on carvedilol   6.25 mg twice daily. CT with calcium score 760,  75th percentile for age with LAD, left circumflex, RCA with mild plaque.  No apparent flow-limiting.  AV heavily calcified.   History Patient Active Problem List   Diagnosis Date Noted   History of COVID-19 05/08/2020   Cough 05/08/2020   Rotator cuff syndrome, left 11/15/2018   Chronic left shoulder pain 08/09/2018   Left retinal detachment 08/11/2017   Gout 02/27/2013   Essential hypertension, benign 02/27/2013   Type II or unspecified type diabetes mellitus without mention of complication, uncontrolled 02/27/2013   Past Medical History:  Diagnosis Date   Arthritis    Diabetes mellitus without complication ([place])    Heart murmur    Hypertension    Past Surgical History:  Procedure Laterality Date   CATARACT EXTRACTION     FRACTURE SURGERY     Lt ankle   GAS/FLUID EXCHANGE Left 08/12/2017   Procedure: [id number] GAS/FLUID EXCHANGE;  Surgeon: Bernarda Caffey, MD;  Location: [facility];  Service: Ophthalmology;  Laterality: Left;   HERNIA REPAIR     IRIDOTOMY / IRIDECTOMY Bilateral    stents   VASECTOMY     VITRECTOMY 25 GAUGE WITH SCLERAL BUCKLE Left 08/12/2017   Procedure: VITRECTOMY 25 GAUGE WITH SCLERAL BUCKLE WITH ENDOLASER;  Surgeon: Bernarda Caffey, MD;  Location: [facility];  Service: Ophthalmology;  Laterality: Left;   No Known Allergies Prior to Admission medications  Medication Sig Start Date End Date Taking? Authorizing Provider  ACCU-CHEK SOFTCLIX LANCETS lancets Test blood sugar once daily. Dx E11.9 08/26/15  Yes [name], MD  acetaminophen (TYLENOL) 500 MG tablet Take 2 tablets by mouth every 8 (eight) hours as needed.   Yes [provider]  amLODipine (NORVASC) 5 MG tablet TAKE 1 TABLET EVERY DAY 07/04/21  Yes [name], MD  aspirin EC 81 MG tablet Take 1 tablet (81 mg total) by mouth daily. 02/27/13  Yes Shawnee Knapp, MD  atorvastatin (LIPITOR) 10 MG tablet TAKE 1 TABLET EVERY DAY 02/06/21  Yes [name], MD  blood glucose meter kit and supplies  TrueMetrix, TruTrack, Accu-Chek Aviva Expert, Accu-Chek Aviva Plus, Accu-Chek Guide, Accu-Chek Nano, or Accu-Chek Smartview.   Use once per day. 07/31/17  Yes [name], MD  Blood Glucose Monitoring Suppl (BLOOD GLUCOSE MONITOR KIT) KIT Use to test blood sugar twice daily. 07/17/13  Yes [name], MD  carvedilol (COREG) 6.25 MG tablet Take 1 tablet (6.25 mg total) by mouth 2 (two) times daily. 08/06/21  Yes Nahser, Wonda Cheng, MD  clopidogrel (PLAVIX) 75 MG tablet Take 1 tablet (75 mg total) by mouth daily. 07/25/21  Yes [name], MD  colchicine (COLCRYS) 0.6 MG tablet Take 2 tabs po at symptom onset. Repeat 1 tab po 1 hour later. 06/22/17  Yes [name], MD  empagliflozin (JARDIANCE) 25 MG TABS tablet Take 1 tablet (25 mg total) by mouth daily. 08/05/21  Yes Maximiano Coss, NP  glucose blood (TRUE METRIX BLOOD GLUCOSE TEST) test strip 1 each by Other route 3 (three) times daily. Use as instructed 08/05/21  Yes Maximiano Coss, NP  Lancets [facility] ULTRASOFT) lancets Use as instructed 06/05/13  Yes [name], MD  latanoprost (XALATAN) 0.005 % ophthalmic solution Place 1 drop into both eyes every evening. 07/23/17  Yes [provider]  losartan (COZAAR) 100 MG tablet TAKE 1 TABLET EVERY DAY 07/04/21  Yes [name], MD  metFORMIN (GLUCOPHAGE) 500 MG tablet Take 1 tablet (500 mg total) by mouth 2 (two) times daily with a meal. 07/25/21  Yes [name], MD  metoprolol tartrate (LOPRESSOR) 100 MG tablet Take 1 tablet by mouth two hours prior to CT scan 08/06/21  Yes Nahser, Wonda Cheng, MD  Omega-3 Fatty Acids (FISH OIL) 1000 MG CAPS Take by mouth daily.    Yes [provider]  TRUEplus Lancets 33G MISC 1 each by Does not apply route in the morning, at noon, and at bedtime. 08/05/21  Yes Maximiano Coss, NP   Social History   Socioeconomic History   Marital status: Married    Spouse name: Not on file   Number of children: Not on file   Years  of education: Not on file   Highest education level: Not on file  Occupational History   Occupation: Educational psychologist  Tobacco Use   Smoking status: Former   Smokeless tobacco: Never  Substance and Sexual Activity   Alcohol use: No    Alcohol/week: 0.0 standard drinks   Drug use: No   Sexual activity: Never  Other Topics Concern   Not on file  Social History Narrative   Married   Counsellor at [employer]   Social Determinants of Health   Financial Resource Strain: Not on file  Food Insecurity: Not on file  Transportation Needs: Not on file  Physical Activity: Not on file  Stress: Not  on file  Social Connections: Not on file  Intimate Partner Violence: Not on file    Review of Systems Per HPI   Objective:   Vitals:   09/05/21 1020 09/05/21 1028  BP: (!) 156/88 (!) 148/86  Pulse: 66   Resp: 16   Temp: 98.3 F (36.8 C)   TempSrc: Temporal   SpO2: 96%   Weight: 266 lb 6.4 oz (120.8 kg)   Height: 6' (1.829 m)     BP Readings from Last 3 Encounters:  09/05/21 (!) 148/86  08/29/21 115/85  08/06/21 134/78    Physical Exam Vitals reviewed.  Constitutional:      Appearance: He is well-developed.  HENT:     Head: Normocephalic and atraumatic.  Neck:     Vascular: No carotid bruit or JVD.  Cardiovascular:     Rate and Rhythm: Normal rate and regular rhythm.     Heart sounds: Normal heart sounds. No murmur heard. Pulmonary:     Effort: Pulmonary effort is normal.     Breath sounds: Normal breath sounds. No rales.  Musculoskeletal:     Right lower leg: No edema.     Left lower leg: No edema.  Skin:    General: Skin is warm and dry.  Neurological:     General: No focal deficit present.     Mental Status: He is alert and oriented to person, place, and time.  Psychiatric:        Mood and Affect: Mood normal.        Behavior: Behavior normal.       Assessment & Plan:  SEMAJE KINKER is a 76 y.o. male . Type 2 diabetes mellitus with  obesity ([place]) - Plan: POCT glucose (manual entry), POCT glycosylated hemoglobin (Hb A1C)  -Elevated A1c prior but improved home readings recently.  May not need to start Jardiance.  Continue metformin twice daily dosing, option to add Jardiance with coupon given.  Option to change to other SGLT2 with discount program if cost prohibitive.  7-monthfollow-up for repeat A1c.  Hyperlipidemia, unspecified hyperlipidemia type - Plan: atorvastatin (LIPITOR) 10 MG tablet, Lipid panel, Comprehensive metabolic panel  -Tolerating current regimen, updated labs ordered  TIA (transient ischemic attack) - Plan: clopidogrel (PLAVIX) 75 MG tablet  -Feels well as above, tolerating Plavix, continue same with number provided for him to contact neurology for follow-up.  Meds ordered this encounter  Medications   atorvastatin (LIPITOR) 10 MG tablet    Sig: Take 1 tablet (10 mg total) by mouth daily.    Dispense:  90 tablet    Refill:  2   clopidogrel (PLAVIX) 75 MG tablet    Sig: Take 1 tablet (75 mg total) by mouth daily.    Dispense:  90 tablet    Refill:  1   Patient Instructions  Continue metformin twice per day.  If your blood sugars start to go up into the higher 100s I do recommend starting Jardiance.  We will give you a coupon to try today but if that is not covered let me know may be other options that are less costly or have medication assistance programs. Recheck diabetes in 3 months. No med changes for now.   Continue Plavix for the history of the transient ischemic attack or TIA.  It does appear that you are overdue for follow-up with Dr. JTomi Likens your neurologist.  Here is his office number to call for appointment: [facility] [place]: [phone]  [address], [place], [place] [address] Phone: [phone]  Type 2 Diabetes Mellitus, Self-Care, Adult Caring for yourself after you have been diagnosed with type 2 diabetes (type 2 diabetes mellitus) means  keeping your blood sugar (glucose) under control with a balance of: Nutrition. Exercise. Lifestyle changes. Medicines or insulin, if needed. Support from your team of health care providers and others. What are the risks? Having type 2 diabetes can put you at risk for other long-term (chronic) conditions, such as heart disease and kidney disease. Your health care provider may prescribe medicines to help prevent complications from diabetes. How to monitor your blood glucose  Check your blood glucose every day or as often as told by your health care provider. Have your A1C (hemoglobin A1C) level checked two or more times a year, or as often as told by your health care provider. Your health care provider will set personalized treatment goals for you. Generally, the goal of treatment is to maintain the following blood glucose levels: Before meals: 80-130 mg/dL (4.4-7.2 mmol/L). After meals: below 180 mg/dL (10 mmol/L). A1C level: less than 7%. How to manage hyperglycemia and hypoglycemia Hyperglycemia symptoms Hyperglycemia, also called high blood glucose, occurs when blood glucose is too high. Make sure you know the early signs of hyperglycemia, such as: Increased thirst. Hunger. Feeling very tired. Needing to urinate more often than usual. Blurry vision. Hypoglycemia symptoms Hypoglycemia, also called low blood glucose, occurs with a blood glucose level at or below 70 mg/dL (3.9 mmol/L). Diabetes medicines lower your blood glucose and can cause hypoglycemia. The risk for hypoglycemia increases during or after exercise, during sleep, during illness, and when skipping meals or not eating for a long time (fasting). It is important to know the symptoms of hypoglycemia and treat it right away. Always have a 15-gram rapid-acting carbohydrate snack with you to treat low blood glucose. Family members and close friends should also know the symptoms and understand how to treat hypoglycemia, in case you  are not able to treat yourself. Symptoms may include: Hunger. Anxiety. Sweating and feeling clammy. Dizziness or feeling light-headed. Sleepiness. Increased heart rate. Irritability. Tingling or numbness around the mouth, lips, or tongue. Restless sleep. Severe hypoglycemia is when your blood glucose level is at or below 54 mg/dL (3 mmol/L). Severe hypoglycemia is an emergency. Do not wait to see if the symptoms will go away. Get medical help right away. Call your local emergency services (911 in the [place] Do not drive yourself to the hospital. If you have severe hypoglycemia and you cannot eat or drink, you may need glucagon. A family member or close friend should learn how to check your blood glucose and how to give you glucagon. Ask your health care provider if you need to have an emergency glucagon kit available. Follow these instructions at home: Medicines Take prescribed insulin or diabetes medicines as told by your health care provider. Do not run out of insulin or other diabetes medicines. Plan ahead so you always have these available. If you use insulin, adjust your dosage based on your physical activity and what foods you eat. Your health care provider will tell you how to adjust your dosage. Take over-the-counter and prescription medicines only as told by your health care provider. Eating and drinking  What you eat and drink affects your blood glucose and your insulin dosage. Making good choices helps to control your diabetes and prevent other health problems. A healthy meal plan includes eating lean proteins, complex carbohydrates, fresh  fruits and vegetables, low-fat dairy products, and healthy fats. Make an appointment to see a registered dietitian to help you create an eating plan that is right for you. Make sure that you: Follow instructions from your health care provider about eating or drinking restrictions. Drink enough fluid to keep your urine pale yellow. Keep a record  of the carbohydrates that you eat. Do this by reading food labels and learning the standard serving sizes of foods. Follow your sick-day plan whenever you cannot eat or drink as usual. Make this plan in advance with your health care provider.  Activity Stay active. Exercise regularly, as told by your health care provider. This may include: Stretching and doing strength exercises, such as yoga or weight lifting, two or more times a week. Doing 150 minutes or more of moderate-intensity or vigorous-intensity exercise each week. This could be brisk walking, biking, or water aerobics. Spread out your activity over 3 or more days of the week. Do not go more than 2 days in a row without doing some kind of physical activity. When you start a new exercise or activity, work with your health care provider to adjust your insulin, medicines, or food intake as needed. Lifestyle Do not use any products that contain nicotine or tobacco. These products include cigarettes, chewing tobacco, and vaping devices, such as e-cigarettes. If you need help quitting, ask your health care provider. If you drink alcohol and your health care provider says that it is safe for you: Limit how much you have to: 0-1 drink a day for women who are not pregnant. 0-2 drinks a day for men. Know how much alcohol is in your drink. In the [place] one drink equals one 12 oz bottle of beer (355 mL), one 5 oz glass of wine (148 mL), or one 1 oz glass of hard liquor (44 mL). Learn to manage stress. If you need help with this, ask your health care provider. Take care of your body  Keep your immunizations up to date. In addition to getting vaccinations as told by your health care provider, it is recommended that you get vaccinated against the following illnesses: The flu (influenza). Get a flu shot every year. Pneumonia. Hepatitis B. Schedule an eye exam soon after your diagnosis, and then one time every year after that. Check your skin and  feet every day for cuts, bruises, redness, blisters, or sores. Schedule a foot exam with your health care provider once every year. Brush your teeth and gums two times a day, and floss one or more times a day. Visit your dentist one or more times every 6 months. Maintain a healthy weight. General instructions Share your diabetes management plan with people in your workplace, school, and household. Carry a medical alert card or wear medical alert jewelry. Keep all follow-up visits. This is important. Questions to ask your health care provider Should I meet with a certified diabetes care and education specialist? Where can I find a support group for people with diabetes? Where to find more information For help and guidance and for more information about diabetes, please visit: American Diabetes Association (ADA): www.diabetes.org American Association of Diabetes Care and Education Specialists (ADCES): www.diabeteseducator.org International Diabetes Federation (IDF): MemberVerification.ca Summary Caring for yourself after you have been diagnosed with type 2 diabetes (type 2 diabetes mellitus) means keeping your blood sugar (glucose) under control with a balance of nutrition, exercise, lifestyle changes, and medicine. Check your blood glucose every day, as often as told by your health  care provider. Having diabetes can put you at risk for other long-term (chronic) conditions, such as heart disease and kidney disease. Your health care provider may prescribe medicines to help prevent complications from diabetes. Share your diabetes management plan with people in your workplace, school, and household. Keep all follow-up visits. This is important. This information is not intended to replace advice given to you by your health care provider. Make sure you discuss any questions you have with your health care provider. Document Revised: 08/28/2020 Document Reviewed: 08/28/2020 Elsevier Patient Education  [place],   Merri Ray, MD [place] 09/05/21 10:54 AM

## 2021-09-05 NOTE — Patient Instructions (Addendum)
Continue metformin twice per day.  If your blood sugars start to go up into the higher 100s I do recommend starting Jardiance.  We will give you a coupon to try today but if that is not covered let me know may be other options that are less costly or have medication assistance programs. Recheck diabetes in 3 months. No med changes for now.   Continue Plavix for the history of the transient ischemic attack or TIA.  It does appear that you are overdue for follow-up with Dr. Tomi Likens, your neurologist.  Here is his office number to call for appointment: [facility] in [place], [place] Address: [address], [place], [place] [address] Phone: [phone]  Type 2 Diabetes Mellitus, Self-Care, Adult Caring for yourself after you have been diagnosed with type 2 diabetes (type 2 diabetes mellitus) means keeping your blood sugar (glucose) under control with a balance of: Nutrition. Exercise. Lifestyle changes. Medicines or insulin, if needed. Support from your team of health care providers and others. What are the risks? Having type 2 diabetes can put you at risk for other long-term (chronic) conditions, such as heart disease and kidney disease. Your health care provider may prescribe medicines to help prevent complications from diabetes. How to monitor your blood glucose  Check your blood glucose every day or as often as told by your health care provider. Have your A1C (hemoglobin A1C) level checked two or more times a year, or as often as told by your health care provider. Your health care provider will set personalized treatment goals for you. Generally, the goal of treatment is to maintain the following blood glucose levels: Before meals: 80-130 mg/dL (4.4-7.2 mmol/L). After meals: below 180 mg/dL (10 mmol/L). A1C level: less than 7%. How to manage hyperglycemia and hypoglycemia Hyperglycemia symptoms Hyperglycemia, also called high blood glucose, occurs when  blood glucose is too high. Make sure you know the early signs of hyperglycemia, such as: Increased thirst. Hunger. Feeling very tired. Needing to urinate more often than usual. Blurry vision. Hypoglycemia symptoms Hypoglycemia, also called low blood glucose, occurs with a blood glucose level at or below 70 mg/dL (3.9 mmol/L). Diabetes medicines lower your blood glucose and can cause hypoglycemia. The risk for hypoglycemia increases during or after exercise, during sleep, during illness, and when skipping meals or not eating for a long time (fasting). It is important to know the symptoms of hypoglycemia and treat it right away. Always have a 15-gram rapid-acting carbohydrate snack with you to treat low blood glucose. Family members and close friends should also know the symptoms and understand how to treat hypoglycemia, in case you are not able to treat yourself. Symptoms may include: Hunger. Anxiety. Sweating and feeling clammy. Dizziness or feeling light-headed. Sleepiness. Increased heart rate. Irritability. Tingling or numbness around the mouth, lips, or tongue. Restless sleep. Severe hypoglycemia is when your blood glucose level is at or below 54 mg/dL (3 mmol/L). Severe hypoglycemia is an emergency. Do not wait to see if the symptoms will go away. Get medical help right away. Call your local emergency services (911 in the [place] Do not drive yourself to the hospital. If you have severe hypoglycemia and you cannot eat or drink, you may need glucagon. A family member or close friend should learn how to check your blood glucose and how to give you glucagon. Ask your health care provider if you need to have an emergency glucagon kit available. Follow these instructions at home: Medicines Take  prescribed insulin or diabetes medicines as told by your health care provider. Do not run out of insulin or other diabetes medicines. Plan ahead so you always have these available. If you use insulin,  adjust your dosage based on your physical activity and what foods you eat. Your health care provider will tell you how to adjust your dosage. Take over-the-counter and prescription medicines only as told by your health care provider. Eating and drinking  What you eat and drink affects your blood glucose and your insulin dosage. Making good choices helps to control your diabetes and prevent other health problems. A healthy meal plan includes eating lean proteins, complex carbohydrates, fresh fruits and vegetables, low-fat dairy products, and healthy fats. Make an appointment to see a registered dietitian to help you create an eating plan that is right for you. Make sure that you: Follow instructions from your health care provider about eating or drinking restrictions. Drink enough fluid to keep your urine pale yellow. Keep a record of the carbohydrates that you eat. Do this by reading food labels and learning the standard serving sizes of foods. Follow your sick-day plan whenever you cannot eat or drink as usual. Make this plan in advance with your health care provider.  Activity Stay active. Exercise regularly, as told by your health care provider. This may include: Stretching and doing strength exercises, such as yoga or weight lifting, two or more times a week. Doing 150 minutes or more of moderate-intensity or vigorous-intensity exercise each week. This could be brisk walking, biking, or water aerobics. Spread out your activity over 3 or more days of the week. Do not go more than 2 days in a row without doing some kind of physical activity. When you start a new exercise or activity, work with your health care provider to adjust your insulin, medicines, or food intake as needed. Lifestyle Do not use any products that contain nicotine or tobacco. These products include cigarettes, chewing tobacco, and vaping devices, such as e-cigarettes. If you need help quitting, ask your health care  provider. If you drink alcohol and your health care provider says that it is safe for you: Limit how much you have to: 0-1 drink a day for women who are not pregnant. 0-2 drinks a day for men. Know how much alcohol is in your drink. In the [place] one drink equals one 12 oz bottle of beer (355 mL), one 5 oz glass of wine (148 mL), or one 1 oz glass of hard liquor (44 mL). Learn to manage stress. If you need help with this, ask your health care provider. Take care of your body  Keep your immunizations up to date. In addition to getting vaccinations as told by your health care provider, it is recommended that you get vaccinated against the following illnesses: The flu (influenza). Get a flu shot every year. Pneumonia. Hepatitis B. Schedule an eye exam soon after your diagnosis, and then one time every year after that. Check your skin and feet every day for cuts, bruises, redness, blisters, or sores. Schedule a foot exam with your health care provider once every year. Brush your teeth and gums two times a day, and floss one or more times a day. Visit your dentist one or more times every 6 months. Maintain a healthy weight. General instructions Share your diabetes management plan with people in your workplace, school, and household. Carry a medical alert card or wear medical alert jewelry. Keep all follow-up visits. This is important. Questions  to ask your health care provider Should I meet with a certified diabetes care and education specialist? Where can I find a support group for people with diabetes? Where to find more information For help and guidance and for more information about diabetes, please visit: American Diabetes Association (ADA): www.diabetes.org American Association of Diabetes Care and Education Specialists (ADCES): www.diabeteseducator.org International Diabetes Federation (IDF): MemberVerification.ca Summary Caring for yourself after you have been diagnosed with type 2 diabetes  (type 2 diabetes mellitus) means keeping your blood sugar (glucose) under control with a balance of nutrition, exercise, lifestyle changes, and medicine. Check your blood glucose every day, as often as told by your health care provider. Having diabetes can put you at risk for other long-term (chronic) conditions, such as heart disease and kidney disease. Your health care provider may prescribe medicines to help prevent complications from diabetes. Share your diabetes management plan with people in your workplace, school, and household. Keep all follow-up visits. This is important. This information is not intended to replace advice given to you by your health care provider. Make sure you discuss any questions you have with your health care provider. Document Revised: 08/28/2020 Document Reviewed: 08/28/2020 Elsevier Patient Education  [place].

## 2021-09-11 NOTE — Progress Notes (Unsigned)
NEUROLOGY FOLLOW UP OFFICE NOTE  Jeffery George [id number]  Assessment/Plan:    Transient ischemic attack presenting with left sided symptoms - given that he had 3 habitual spells back to back, suspect that it could be related to right ICA stenosis.  As episodes were all identical, less likely cardioembolic Right internal carotid artery stenosis Hypertension Type 2 diabetes mellitus Hyperlipidemia   Secondary stroke prevention as managed by PCP: Plavix 84m daily.  May discontinue ASA Atorvastatin.  LDL goal less than 70 Optimize glycemic control.  Hgb A1c goal less than 7 Normotensive blood pressure Mediterranean diet Routine exercise Follow up as needed   Subjective:  Jeffery George a 76year old right-handed male with HTN and diabetes who follows up for TIA.  UPDATE: Current medications:  ASA 85mdaily, Plavix 7562maily, atorvastatin 51m43moreg, losartan, Jardiance, metformin  Last seen on 09/20/2020. CTA of head on 11/01/2020 personally reviewed showed moderate right P2 PCA and left intradural vertebral artery stenosis, mild right and mild-moderate left paraclinoid ICA stenosis but no large vessel occlusion.  CTA of neck on 11/01/2020 personally reviewed showed approximately 50% right and 30-40% left proximal ICA stenosis due to atherosclerosis as well as moderate left vertebral artery origin stenosis.  As he did not demonstrate significant stenosis on CTA, vascular intervention was not felt to be warranted.    Labs from 09/05/2021 include Hgb A1c 7.3 and LDL 53   HISTORY: Longstanding history of heart murmur.   Around the first week of May, he had three TIA events over a 5 day period.  He describes onset of left facial numbness and left arm/hand numbness and weakness lasting 30 minutes.  He had a recurrent episode the following day lasting a couple of minutes and another one a couple of days later also lasting a couple of minutes.  He followed up with cardiology.   Echocardiogram on 5/11 showed EF greater than 75% with no cardiac source of embolus.   He was already on ASA 81mg35me saw his PCP on 5/19, whe added Plavix for dual antiplatelet therapy for 30 days.  Carotid ultrasound on 09/12/2020 showed minimal to moderate atherosclerotic plaque resulting in 50-69% stenosis of the right ICA, minimal to moderate atherosclerotic plaque without hemodynamically significant stenosis of the left ICA, antegrade flow of the vertebral arteries but possible hemodynamically significant stenosis of the right subclavian artery.  MRI of brain without contrast on 09/14/2020 personally reviewed showed extensive chronic small vessel ischemic changes in the bilateral cerebral white matter with superficial siderosis involving the vertex but less involvement of the posterior fossa but no acute or subacute intracranial abnormalities such as stroke or bleed.     Labs from 07/11/2020 demonstrated LDL of 56 and Hgb A1c 6.1.   He takes ASA 81mg 36my, atorvastatin 51mg (24mhyperlipidemia), losartan and amlodipine (for hypertension) and metformin (for diabetes).  He has history of systolic murmur.      PAST MEDICAL HISTORY: Past Medical History:  Diagnosis Date   Arthritis    Diabetes mellitus without complication (HCC)    Heart murmur    Hypertension     MEDICATIONS: Current Outpatient Medications on File Prior to Visit  Medication Sig Dispense Refill   ACCU-CHEK SOFTCLIX LANCETS lancets Test blood sugar once daily. Dx E11.9 100 each 2   acetaminophen (TYLENOL) 500 MG tablet Take 2 tablets by mouth every 8 (eight) hours as needed.     amLODipine (NORVASC) 5 MG tablet TAKE 1 TABLET  EVERY DAY 90 tablet 1   aspirin EC 81 MG tablet Take 1 tablet (81 mg total) by mouth daily.     atorvastatin (LIPITOR) 10 MG tablet Take 1 tablet (10 mg total) by mouth daily. 90 tablet 2   blood glucose meter kit and supplies TrueMetrix, TruTrack, Accu-Chek Aviva Expert, Accu-Chek Aviva Plus, Accu-Chek  Guide, Accu-Chek Nano, or Accu-Chek Smartview.   Use once per day. 1 each 0   Blood Glucose Monitoring Suppl (BLOOD GLUCOSE MONITOR KIT) KIT Use to test blood sugar twice daily. 1 each 0   carvedilol (COREG) 6.25 MG tablet Take 1 tablet (6.25 mg total) by mouth 2 (two) times daily. 180 tablet 3   clopidogrel (PLAVIX) 75 MG tablet Take 1 tablet (75 mg total) by mouth daily. 90 tablet 1   colchicine (COLCRYS) 0.6 MG tablet Take 2 tabs po at symptom onset. Repeat 1 tab po 1 hour later. 12 tablet 0   empagliflozin (JARDIANCE) 25 MG TABS tablet Take 1 tablet (25 mg total) by mouth daily. 90 tablet 0   glucose blood (TRUE METRIX BLOOD GLUCOSE TEST) test strip 1 each by Other route 3 (three) times daily. Use as instructed 300 each 5   Lancets (ONETOUCH ULTRASOFT) lancets Use as instructed 100 each 12   latanoprost (XALATAN) 0.005 % ophthalmic solution Place 1 drop into both eyes every evening.     losartan (COZAAR) 100 MG tablet TAKE 1 TABLET EVERY DAY 90 tablet 1   metFORMIN (GLUCOPHAGE) 500 MG tablet Take 1 tablet (500 mg total) by mouth 2 (two) times daily with a meal. 90 tablet 1   Omega-3 Fatty Acids (FISH OIL) 1000 MG CAPS Take by mouth daily.      TRUEplus Lancets 33G MISC 1 each by Does not apply route in the morning, at noon, and at bedtime. 300 each 5   No current facility-administered medications on file prior to visit.    ALLERGIES: No Known Allergies  FAMILY HISTORY: Family History  Problem Relation Age of Onset   Cancer Mother    Diabetes Mother    Stroke Mother    Heart disease Father    Stroke Father    Diabetes Brother    Heart disease Brother    Diabetes Brother    Colon cancer Neg Hx       Objective:  *** General: No acute distress.  Patient appears ***-groomed.   Head:  Normocephalic/atraumatic Eyes:  Fundi examined but not visualized Neck: supple, no paraspinal tenderness, full range of motion Heart:  Regular rate and rhythm Lungs:  Clear to auscultation  bilaterally Back: No paraspinal tenderness Neurological Exam: alert and oriented to person, place, and time.  Speech fluent and not dysarthric, language intact.  CN II-XII intact. Bulk and tone normal, muscle strength 5/5 throughout.  Sensation to light touch intact.  Deep tendon reflexes 2+ throughout, toes downgoing.  Finger to nose testing intact.  Gait normal, Romberg negative.   Metta Clines, DO  CC: Merri Ray, MD

## 2021-09-12 ENCOUNTER — Ambulatory Visit: Payer: Medicare HMO | Admitting: Neurology

## 2021-09-12 ENCOUNTER — Encounter: Payer: Self-pay | Admitting: Neurology

## 2021-09-12 VITALS — BP 172/83 | HR 70 | Ht 72.0 in | Wt 262.8 lb

## 2021-09-12 DIAGNOSIS — G459 Transient cerebral ischemic attack, unspecified: Secondary | ICD-10-CM

## 2021-09-12 DIAGNOSIS — I10 Essential (primary) hypertension: Secondary | ICD-10-CM | POA: Diagnosis not present

## 2021-09-12 DIAGNOSIS — E119 Type 2 diabetes mellitus without complications: Secondary | ICD-10-CM

## 2021-09-12 NOTE — Patient Instructions (Signed)
Continue Plavix '75mg'$  daily.  May discontinue aspirin Continue atorvastatin, blood pressure medication, and diabetes medication. Mediterranean diet Routine exercise Follow up as needed  Mediterranean Diet A Mediterranean diet refers to food and lifestyle choices that are based on the traditions of countries located on the [employer]. It focuses on eating more fruits, vegetables, whole grains, beans, nuts, seeds, and heart-healthy fats, and eating less dairy, meat, eggs, and processed foods with added sugar, salt, and fat. This way of eating has been shown to help prevent certain conditions and improve outcomes for people who have chronic diseases, like kidney disease and heart disease. What are tips for following this plan? Reading food labels Check the serving size of packaged foods. For foods such as rice and pasta, the serving size refers to the amount of cooked product, not dry. Check the total fat in packaged foods. Avoid foods that have saturated fat or trans fats. Check the ingredient list for added sugars, such as corn syrup. Shopping  Buy a variety of foods that offer a balanced diet, including: Fresh fruits and vegetables (produce). Grains, beans, nuts, and seeds. Some of these may be available in unpackaged forms or large amounts (in bulk). Fresh seafood. Poultry and eggs. Low-fat dairy products. Buy whole ingredients instead of prepackaged foods. Buy fresh fruits and vegetables in-season from local farmers markets. Buy plain frozen fruits and vegetables. If you do not have access to quality fresh seafood, buy precooked frozen shrimp or canned fish, such as tuna, salmon, or sardines. Stock your pantry so you always have certain foods on hand, such as olive oil, canned tuna, canned tomatoes, rice, pasta, and beans. Cooking Cook foods with extra-virgin olive oil instead of using butter or other vegetable oils. Have meat as a side dish, and have vegetables or grains as your  main dish. This means having meat in small portions or adding small amounts of meat to foods like pasta or stew. Use beans or vegetables instead of meat in common dishes like chili or lasagna. Experiment with different cooking methods. Try roasting, broiling, steaming, and sauting vegetables. Add frozen vegetables to soups, stews, pasta, or rice. Add nuts or seeds for added healthy fats and plant protein at each meal. You can add these to yogurt, salads, or vegetable dishes. Marinate fish or vegetables using olive oil, lemon juice, garlic, and fresh herbs. Meal planning Plan to eat one vegetarian meal one day each week. Try to work up to two vegetarian meals, if possible. Eat seafood two or more times a week. Have healthy snacks readily available, such as: Vegetable sticks with hummus. Greek yogurt. Fruit and nut trail mix. Eat balanced meals throughout the week. This includes: Fruit: 2-3 servings a day. Vegetables: 4-5 servings a day. Low-fat dairy: 2 servings a day. Fish, poultry, or lean meat: 1 serving a day. Beans and legumes: 2 or more servings a week. Nuts and seeds: 1-2 servings a day. Whole grains: 6-8 servings a day. Extra-virgin olive oil: 3-4 servings a day. Limit red meat and sweets to only a few servings a month. Lifestyle  Cook and eat meals together with your family, when possible. Drink enough fluid to keep your urine pale yellow. Be physically active every day. This includes: Aerobic exercise like running or swimming. Leisure activities like gardening, walking, or housework. Get 7-8 hours of sleep each night. If recommended by your health care provider, drink red wine in moderation. This means 1 glass a day for nonpregnant women and 2 glasses a  day for men. A glass of wine equals 5 oz (150 mL). What foods should I eat? Fruits Apples. Apricots. Avocado. Berries. Bananas. Cherries. Dates. Figs. Grapes. Lemons. Melon. Oranges. Peaches. Plums.  Pomegranate. Vegetables Artichokes. Beets. Broccoli. Cabbage. Carrots. Eggplant. Green beans. Chard. Kale. Spinach. Onions. Leeks. Peas. Squash. Tomatoes. Peppers. Radishes. Grains Whole-grain pasta. Brown rice. Bulgur wheat. Polenta. Couscous. Whole-wheat bread. [place]. Meats and other proteins Beans. Almonds. Sunflower seeds. Pine nuts. Peanuts. [place]. Salmon. Scallops. Shrimp. Salida. Tilapia. Clams. Oysters. Eggs. Poultry without skin. Dairy Low-fat milk. Cheese. Greek yogurt. Fats and oils Extra-virgin olive oil. Avocado oil. Grapeseed oil. Beverages Water. Red wine. Herbal tea. Sweets and desserts Greek yogurt with honey. Baked apples. Poached pears. Trail mix. Seasonings and condiments Basil. Cilantro. Coriander. Cumin. Mint. Parsley. Sage. Rosemary. Tarragon. Garlic. Oregano. Thyme. Pepper. Balsamic vinegar. Tahini. Hummus. Tomato sauce. Olives. Mushrooms. The items listed above may not be a complete list of foods and beverages you can eat. Contact a dietitian for more information. What foods should I limit? This is a list of foods that should be eaten rarely or only on special occasions. Fruits Fruit canned in syrup. Vegetables Deep-fried potatoes (french fries). Grains Prepackaged pasta or rice dishes. Prepackaged cereal with added sugar. Prepackaged snacks with added sugar. Meats and other proteins Beef. Pork. Lamb. Poultry with skin. Hot dogs. [place] Salines. Dairy Ice cream. Sour cream. Whole milk. Fats and oils Butter. Canola oil. Vegetable oil. Beef fat (tallow). Lard. Beverages Juice. Sugar-sweetened soft drinks. Beer. Liquor and spirits. Sweets and desserts Cookies. Cakes. Pies. Candy. Seasonings and condiments Mayonnaise. Pre-made sauces and marinades. The items listed above may not be a complete list of foods and beverages you should limit. Contact a dietitian for more information. Summary The Mediterranean diet includes both food and lifestyle choices. Eat a  variety of fresh fruits and vegetables, beans, nuts, seeds, and whole grains. Limit the amount of red meat and sweets that you eat. If recommended by your health care provider, drink red wine in moderation. This means 1 glass a day for nonpregnant women and 2 glasses a day for men. A glass of wine equals 5 oz (150 mL). This information is not intended to replace advice given to you by your health care provider. Make sure you discuss any questions you have with your health care provider. Document Revised: 05/05/2019 Document Reviewed: 03/02/2019 Elsevier Patient Education  [place].

## 2021-10-06 ENCOUNTER — Telehealth: Payer: Self-pay | Admitting: Family Medicine

## 2021-10-20 ENCOUNTER — Other Ambulatory Visit: Payer: Self-pay | Admitting: Family Medicine

## 2021-10-20 DIAGNOSIS — E1165 Type 2 diabetes mellitus with hyperglycemia: Secondary | ICD-10-CM

## 2021-12-10 ENCOUNTER — Ambulatory Visit (INDEPENDENT_AMBULATORY_CARE_PROVIDER_SITE_OTHER): Payer: Medicare HMO | Admitting: Family Medicine

## 2021-12-10 ENCOUNTER — Encounter: Payer: Self-pay | Admitting: Family Medicine

## 2021-12-10 VITALS — BP 138/76 | HR 91 | Temp 98.1°F | Ht 72.0 in | Wt 264.0 lb

## 2021-12-10 DIAGNOSIS — I10 Essential (primary) hypertension: Secondary | ICD-10-CM

## 2021-12-10 DIAGNOSIS — E785 Hyperlipidemia, unspecified: Secondary | ICD-10-CM

## 2021-12-10 DIAGNOSIS — E1169 Type 2 diabetes mellitus with other specified complication: Secondary | ICD-10-CM

## 2021-12-10 DIAGNOSIS — M109 Gout, unspecified: Secondary | ICD-10-CM

## 2021-12-10 DIAGNOSIS — E669 Obesity, unspecified: Secondary | ICD-10-CM

## 2021-12-10 DIAGNOSIS — G459 Transient cerebral ischemic attack, unspecified: Secondary | ICD-10-CM | POA: Diagnosis not present

## 2021-12-10 LAB — LIPID PANEL
Cholesterol: 138 mg/dL (ref 0–200)
HDL: 42 mg/dL (ref >39.00)
LDL Cholesterol: 64 mg/dL (ref 0–99)
NonHDL: 95.92
Total CHOL/HDL Ratio: 3
Triglycerides: 160 mg/dL — ABNORMAL HIGH (ref 0.0–149.0)
VLDL: 32 mg/dL (ref 0.0–40.0)

## 2021-12-10 LAB — POCT GLYCOSYLATED HEMOGLOBIN (HGB A1C): Hemoglobin A1C: 7.9 % — AB (ref 4.0–5.6)

## 2021-12-10 LAB — GLUCOSE, POCT (MANUAL RESULT ENTRY): POC Glucose: 202 mg/dl — AB (ref 70–99)

## 2021-12-10 MED ORDER — ACCU-CHEK SOFTCLIX LANCETS MISC: 2 refills | Status: AC

## 2021-12-10 MED ORDER — ATORVASTATIN CALCIUM 10 MG PO TABS: 10.0000 mg | ORAL_TABLET | Freq: Every day | ORAL | 2 refills | Status: DC

## 2021-12-10 MED ORDER — METFORMIN HCL 500 MG PO TABS: 1000.0000 mg | ORAL_TABLET | Freq: Two times a day (BID) | ORAL | 0 refills | Status: DC

## 2021-12-10 MED ORDER — AMLODIPINE BESYLATE 5 MG PO TABS: 5.0000 mg | ORAL_TABLET | Freq: Every day | ORAL | 1 refills | Status: DC

## 2021-12-10 MED ORDER — LOSARTAN POTASSIUM 100 MG PO TABS: 100.0000 mg | ORAL_TABLET | Freq: Every day | ORAL | 1 refills | Status: DC

## 2021-12-10 MED ORDER — EMPAGLIFLOZIN 25 MG PO TABS: 25.0000 mg | ORAL_TABLET | Freq: Every day | ORAL | 0 refills | Status: DC

## 2021-12-10 MED ORDER — CLOPIDOGREL BISULFATE 75 MG PO TABS: 75.0000 mg | ORAL_TABLET | Freq: Every day | ORAL | 1 refills | Status: DC

## 2021-12-10 NOTE — Patient Instructions (Addendum)
Increase metformin to 2 pills in the morning, 1 at night for 1st week, then if still having higher readings, 2 pills twice per day. Let me know what dose you end up on in the next few weeks. Recheck in 72month.  No other med changes for now. Take care! Take care.

## 2021-12-10 NOTE — Progress Notes (Signed)
Subjective:  Patient ID: Jeffery George, male    DOB: 1945-08-06  Age: 76 y.o. MRN: [id number]  CC:  Chief Complaint  Patient presents with   Diabetes   Hyperlipidemia    Pt states blood sugar has been in the 200s in the last 2 weeks     HPI Jeffery George presents for   Diabetes: With hyperglycemia.  Improving A1c at his May visit.  Treated with metformin, Jardiance had been ordered but not filled due to cost.  Jardiance start recommended if increasing readings.  He is on statin. Improved readings on higher dose metformin 510m BID - lower 100's then creaping up past  month toward high 100's and low 200's. Random or after meals. Readings fasting: unknown  Symptomatic lows none.  No missed doses. No side effects with metformin.  Exercise: less with heat wave.  Sweet tea/soda - none.  Microalbumin: 3.0 on 07/25/2021.  Wt Readings from Last 3 Encounters:  12/10/21 264 lb (119.7 kg)  09/12/21 262 lb 12.8 oz (119.2 kg)  09/05/21 266 lb 6.4 oz (120.8 kg)   Optho, foot exam, pneumovax:  Ophthalmology exam: appt on 9/15.  Lab Results  Component Value Date   HGBA1C 7.9 (A) 12/10/2021   HGBA1C 7.3 (A) 09/05/2021   HGBA1C 7.9 (H) 07/09/2021   Lab Results  Component Value Date   MICROALBUR 3.0 (H) 07/25/2021   LDLCALC 53 09/05/2021   CREATININE 0.93 09/05/2021   Covid booster - declines.  Shingrix recommended at pharmacy.   Immunization History  Administered Date(s) Administered   Fluad Quad(high Dose 65+) 12/20/2019   Influenza, High Dose Seasonal PF 01/18/2017, 12/10/2017   Influenza,inj,Quad PF,6+ Mos 02/27/2013, 06/11/2014, 12/10/2014, 02/03/2016   Influenza-Unspecified 01/18/2017, 01/11/2019   Pneumococcal Conjugate-13 06/12/2015   Pneumococcal Polysaccharide-23 05/01/2013   Tdap 05/01/2013   Zoster, Live 05/09/2013    Hypertension: Norvasc, losartan, coreg - started by cardiology in [place].  No new med side effects. Feeling well.   Home readings: none BP  Readings from Last 3 Encounters:  12/10/21 138/76  09/12/21 (!) 172/83  09/05/21 (!) 148/86   Lab Results  Component Value Date   CREATININE 0.93 09/05/2021    Taking lipitor daily - no new myalgias.   TIA Symptoms in May.  3 TIA events over 5-day period.  Neurology appointment June 2.  Thought to be due to to right ICA stenosis, less likely cardioembolic.  3 spells back to back.  Secondary stroke prevention with Plavix 75 mg daily, aspirin discontinued.  LDL goal less than 70, continue atorvastatin.  A1c goal less than 7.  Mediterranean diet, exercise. No side effects, no bleeding.   History Patient Active Problem List   Diagnosis Date Noted   History of COVID-19 05/08/2020   Cough 05/08/2020   Rotator cuff syndrome, left 11/15/2018   Chronic left shoulder pain 08/09/2018   Left retinal detachment 08/11/2017   Gout 02/27/2013   Essential hypertension, benign 02/27/2013   Type II or unspecified type diabetes mellitus without mention of complication, uncontrolled 02/27/2013   Past Medical History:  Diagnosis Date   Arthritis    Diabetes mellitus without complication ([facility]    Heart murmur    Hypertension    Past Surgical History:  Procedure Laterality Date   CATARACT EXTRACTION     FRACTURE SURGERY     Lt ankle   GAS/FLUID EXCHANGE Left 08/12/2017   Procedure: C3F8 GAS/FLUID EXCHANGE;  Surgeon: ZBernarda Caffey MD;  [facility]  Service: Ophthalmology;  Laterality: Left;   HERNIA REPAIR     IRIDOTOMY / IRIDECTOMY Bilateral    stents   VASECTOMY     VITRECTOMY 25 GAUGE WITH SCLERAL BUCKLE Left 08/12/2017   Procedure: VITRECTOMY 25 GAUGE WITH SCLERAL BUCKLE WITH ENDOLASER;  Surgeon: Bernarda Caffey, MD;  Location: [facility];  Service: Ophthalmology;  Laterality: Left;   No Known Allergies Prior to Admission medications   Medication Sig Start Date End Date Taking? Authorizing Provider  ACCU-CHEK SOFTCLIX LANCETS lancets Test blood sugar once daily. Dx E11.9 08/26/15   [name], MD  amLODipine (NORVASC) 5 MG tablet TAKE 1 TABLET EVERY DAY 07/04/21   [name], MD  atorvastatin (LIPITOR) 10 MG tablet Take 1 tablet (10 mg total) by mouth daily. 09/05/21   [name], MD  blood glucose meter kit and supplies TrueMetrix, TruTrack, Accu-Chek Aviva Expert, Accu-Chek Aviva Plus, Accu-Chek Guide, Accu-Chek Nano, or Accu-Chek Smartview.   Use once per day. 07/31/17   [name], MD  Blood Glucose Monitoring Suppl (BLOOD GLUCOSE MONITOR KIT) KIT Use to test blood sugar twice daily. 07/17/13   [name], MD  carvedilol (COREG) 6.25 MG tablet Take 1 tablet (6.25 mg total) by mouth 2 (two) times daily. 08/06/21   Nahser, Wonda Cheng, MD  clopidogrel (PLAVIX) 75 MG tablet Take 1 tablet (75 mg total) by mouth daily. 09/05/21   [name], MD  colchicine (COLCRYS) 0.6 MG tablet Take 2 tabs po at symptom onset. Repeat 1 tab po 1 hour later. 06/22/17   [name], MD  empagliflozin (JARDIANCE) 25 MG TABS tablet Take 1 tablet (25 mg total) by mouth daily. 08/05/21   Maximiano Coss, NP  glucose blood (TRUE METRIX BLOOD GLUCOSE TEST) test strip 1 each by Other route 3 (three) times daily. Use as instructed 08/05/21   Maximiano Coss, NP  Lancets [facility] ULTRASOFT) lancets Use as instructed 06/05/13   [name], MD  latanoprost (XALATAN) 0.005 % ophthalmic solution Place 1 drop into both eyes every evening. 07/23/17   [provider]  losartan (COZAAR) 100 MG tablet TAKE 1 TABLET EVERY DAY 07/04/21   [name], MD  metFORMIN (GLUCOPHAGE) 500 MG tablet TAKE 1 TABLET TWICE DAILY WITH MEALS 10/20/21   [name], MD  Omega-3 Fatty Acids (FISH OIL) 1000 MG CAPS Take by mouth daily.     [provider]  TRUEplus Lancets 33G MISC 1 each by Does not apply route in the morning, at noon, and at bedtime. 08/05/21   Maximiano Coss, NP   Social History   Socioeconomic History   Marital status: Married    Spouse name: Not  on file   Number of children: Not on file   Years of education: Not on file   Highest education level: Not on file  Occupational History   Occupation: Educational psychologist  Tobacco Use   Smoking status: Former   Smokeless tobacco: Never  Substance and Sexual Activity   Alcohol use: No    Alcohol/week: 0.0 standard drinks of alcohol   Drug use: No   Sexual activity: Never  Other Topics Concern   Not on file  Social History Narrative   Married   Counsellor at [employer]   Social Determinants of Health   Financial Resource Strain: Not on file  Food Insecurity: Not on file  Transportation Needs: Not on file  Physical Activity: Not on file  Stress: Not on file  Social Connections: Not on file  Intimate Partner Violence: Not on file    Review of Systems  Constitutional:  Negative for fatigue and unexpected weight change.  Eyes:  Negative for visual disturbance.  Respiratory:  Negative for cough, chest tightness and shortness of breath.   Cardiovascular:  Negative for chest pain, palpitations and leg swelling.  Gastrointestinal:  Negative for abdominal pain and blood in stool.  Neurological:  Negative for dizziness, weakness, light-headedness and headaches.     Objective:   Vitals:   12/10/21 1021  BP: 138/76  Pulse: 91  Temp: 98.1 F (36.7 C)  SpO2: 93%  Weight: 264 lb (119.7 kg)  Height: 6' (1.829 m)     Physical Exam Vitals reviewed.  Constitutional:      Appearance: He is well-developed.  HENT:     Head: Normocephalic and atraumatic.  Neck:     Vascular: No carotid bruit or JVD.  Cardiovascular:     Rate and Rhythm: Normal rate and regular rhythm.     Heart sounds: Normal heart sounds. No murmur heard. Pulmonary:     Effort: Pulmonary effort is normal.     Breath sounds: Normal breath sounds. No rales.  Musculoskeletal:     Right lower leg: No edema.     Left lower leg: No edema.  Skin:    General: Skin is warm and dry.  Neurological:      Mental Status: He is alert and oriented to person, place, and time.  Psychiatric:        Mood and Affect: Mood normal.        Assessment & Plan:  Jeffery George is a 76 y.o. male . Type 2 diabetes mellitus with obesity (HCC) - Plan: empagliflozin (JARDIANCE) 25 MG TABS tablet, POCT glycosylated hemoglobin (Hb A1C), POCT glucose (manual entry)  -Decreased control, will increase metformin for now to 1000 mg every morning, option of 1000 mg dosing at bedtime if persistent elevations with update on what dose he settles on in the next few weeks.  61-monthfollow-up.  Hold on Jardiance for now due to cost.  Restart of exercise should also help.  Goal A1c less than 7 with history of TIA.  Essential hypertension, benign - Plan: amLODipine (NORVASC) 5 MG tablet, losartan (COZAAR) 100 MG tablet Stable on current regimen, continue same.  Hyperlipidemia, unspecified hyperlipidemia type - Plan: atorvastatin (LIPITOR) 10 MG tablet, Lipid panel Tolerating Lipitor, continue same dose, goal LDL less than 70 with history of TIA  TIA (transient ischemic attack) - Plan: clopidogrel (PLAVIX) 75 MG tablet Asymptomatic, prevention/secondary prevention as above.   Meds ordered this encounter  Medications   Accu-Chek Softclix Lancets lancets    Sig: Test blood sugar once daily. Dx E11.9    Dispense:  100 each    Refill:  2   amLODipine (NORVASC) 5 MG tablet    Sig: Take 1 tablet (5 mg total) by mouth daily.    Dispense:  90 tablet    Refill:  1   atorvastatin (LIPITOR) 10 MG tablet    Sig: Take 1 tablet (10 mg total) by mouth daily.    Dispense:  90 tablet    Refill:  2   clopidogrel (PLAVIX) 75 MG tablet    Sig: Take 1 tablet (75 mg total) by mouth daily.    Dispense:  90 tablet    Refill:  1   empagliflozin (JARDIANCE) 25 MG TABS tablet    Sig: Take 1 tablet (25 mg  total) by mouth daily.    Dispense:  90 tablet    Refill:  0   losartan (COZAAR) 100 MG tablet    Sig: Take 1 tablet (100 mg  total) by mouth daily.    Dispense:  90 tablet    Refill:  1   There are no Patient Instructions on file for this visit.    Signed,   Merri Ray, MD [place] 12/10/21 11:02 AM

## 2021-12-26 DIAGNOSIS — H401131 Primary open-angle glaucoma, bilateral, mild stage: Secondary | ICD-10-CM | POA: Diagnosis not present

## 2022-01-21 ENCOUNTER — Ambulatory Visit (INDEPENDENT_AMBULATORY_CARE_PROVIDER_SITE_OTHER): Payer: Medicare HMO

## 2022-01-21 VITALS — Ht 72.0 in | Wt 260.0 lb

## 2022-01-21 DIAGNOSIS — Z0000 Encounter for general adult medical examination without abnormal findings: Secondary | ICD-10-CM | POA: Diagnosis not present

## 2022-01-21 NOTE — Patient Instructions (Signed)
Jeffery George , Thank you for taking time to come for your Medicare Wellness Visit. I appreciate your ongoing commitment to your health goals. Please review the following plan we discussed and let me know if I can assist you in the future.   These are the goals we discussed:  Goals      Exercise 3x per week (30 min per time)        This is a list of the screening recommended for you and due dates:  Health Maintenance  Topic Date Due   COVID-19 Vaccine (1) Never done   Flu Shot  11/11/2021   Zoster (Shingles) Vaccine (1 of 2) 03/12/2022*   Hemoglobin A1C  06/11/2022   Eye exam for diabetics  06/27/2022   Yearly kidney health urinalysis for diabetes  07/26/2022   Complete foot exam   07/26/2022   Yearly kidney function blood test for diabetes  09/06/2022   Tetanus Vaccine  05/02/2023   Colon Cancer Screening  07/31/2025   Pneumonia Vaccine  Completed   Hepatitis C Screening: USPSTF Recommendation to screen - Ages 18-79 yo.  Completed   HPV Vaccine  Aged Out  *Topic was postponed. The date shown is not the original due date.    Advanced directives: Please bring a copy of your health care power of attorney and living will to the office to be added to your chart at your convenience.   Conditions/risks identified: Aim for 30 minutes of exercise or brisk walking, 6-8 glasses of water, and 5 servings of fruits and vegetables each day.   Next appointment: Follow up in one year for your annual wellness visit.   Preventive Care 63 Years and Older, Male  Preventive care refers to lifestyle choices and visits with your health care provider that can promote health and wellness. What does preventive care include? A yearly physical exam. This is also called an annual well check. Dental exams once or twice a year. Routine eye exams. Ask your health care provider how often you should have your eyes checked. Personal lifestyle choices, including: Daily care of your teeth and gums. Regular  physical activity. Eating a healthy diet. Avoiding tobacco and drug use. Limiting alcohol use. Practicing safe sex. Taking low doses of aspirin every day. Taking vitamin and mineral supplements as recommended by your health care provider. What happens during an annual well check? The services and screenings done by your health care provider during your annual well check will depend on your age, overall health, lifestyle risk factors, and family history of disease. Counseling  Your health care provider may ask you questions about your: Alcohol use. Tobacco use. Drug use. Emotional well-being. Home and relationship well-being. Sexual activity. Eating habits. History of falls. Memory and ability to understand (cognition). Work and work Statistician. Screening  You may have the following tests or measurements: Height, weight, and BMI. Blood pressure. Lipid and cholesterol levels. These may be checked every 5 years, or more frequently if you are over 50 years old. Skin check. Lung cancer screening. You may have this screening every year starting at age 52 if you have a 30-pack-year history of smoking and currently smoke or have quit within the past 15 years. Fecal occult blood test (FOBT) of the stool. You may have this test every year starting at age 48. Flexible sigmoidoscopy or colonoscopy. You may have a sigmoidoscopy every 5 years or a colonoscopy every 10 years starting at age 75. Prostate cancer screening. Recommendations will vary depending on  your family history and other risks. Hepatitis C blood test. Hepatitis B blood test. Sexually transmitted disease (STD) testing. Diabetes screening. This is done by checking your blood sugar (glucose) after you have not eaten for a while (fasting). You may have this done every 1-3 years. Abdominal aortic aneurysm (AAA) screening. You may need this if you are a current or former smoker. Osteoporosis. You may be screened starting at age 53  if you are at high risk. Talk with your health care provider about your test results, treatment options, and if necessary, the need for more tests. Vaccines  Your health care provider may recommend certain vaccines, such as: Influenza vaccine. This is recommended every year. Tetanus, diphtheria, and acellular pertussis (Tdap, Td) vaccine. You may need a Td booster every 10 years. Zoster vaccine. You may need this after age 37. Pneumococcal 13-valent conjugate (PCV13) vaccine. One dose is recommended after age 35. Pneumococcal polysaccharide (PPSV23) vaccine. One dose is recommended after age 93. Talk to your health care provider about which screenings and vaccines you need and how often you need them. This information is not intended to replace advice given to you by your health care provider. Make sure you discuss any questions you have with your health care provider. Document Released: 04/26/2015 Document Revised: 12/18/2015 Document Reviewed: 01/29/2015 Elsevier Interactive Patient Education  2017 [place] Prevention in the Home Falls can cause injuries. They can happen to people of all ages. There are many things you can do to make your home safe and to help prevent falls. What can I do on the outside of my home? Regularly fix the edges of walkways and driveways and fix any cracks. Remove anything that might make you trip as you walk through a door, such as a raised step or threshold. Trim any bushes or trees on the path to your home. Use bright outdoor lighting. Clear any walking paths of anything that might make someone trip, such as rocks or tools. Regularly check to see if handrails are loose or broken. Make sure that both sides of any steps have handrails. Any raised decks and porches should have guardrails on the edges. Have any leaves, snow, or ice cleared regularly. Use sand or salt on walking paths during winter. Clean up any spills in your garage right away. This  includes oil or grease spills. What can I do in the bathroom? Use night lights. Install grab bars by the toilet and in the tub and shower. Do not use towel bars as grab bars. Use non-skid mats or decals in the tub or shower. If you need to sit down in the shower, use a plastic, non-slip stool. Keep the floor dry. Clean up any water that spills on the floor as soon as it happens. Remove soap buildup in the tub or shower regularly. Attach bath mats securely with double-sided non-slip rug tape. Do not have throw rugs and other things on the floor that can make you trip. What can I do in the bedroom? Use night lights. Make sure that you have a light by your bed that is easy to reach. Do not use any sheets or blankets that are too big for your bed. They should not hang down onto the floor. Have a firm chair that has side arms. You can use this for support while you get dressed. Do not have throw rugs and other things on the floor that can make you trip. What can I do in the kitchen? Clean  up any spills right away. Avoid walking on wet floors. Keep items that you use a lot in easy-to-reach places. If you need to reach something above you, use a strong step stool that has a grab bar. Keep electrical cords out of the way. Do not use floor polish or wax that makes floors slippery. If you must use wax, use non-skid floor wax. Do not have throw rugs and other things on the floor that can make you trip. What can I do with my stairs? Do not leave any items on the stairs. Make sure that there are handrails on both sides of the stairs and use them. Fix handrails that are broken or loose. Make sure that handrails are as long as the stairways. Check any carpeting to make sure that it is firmly attached to the stairs. Fix any carpet that is loose or worn. Avoid having throw rugs at the top or bottom of the stairs. If you do have throw rugs, attach them to the floor with carpet tape. Make sure that you  have a light switch at the top of the stairs and the bottom of the stairs. If you do not have them, ask someone to add them for you. What else can I do to help prevent falls? Wear shoes that: Do not have high heels. Have rubber bottoms. Are comfortable and fit you well. Are closed at the toe. Do not wear sandals. If you use a stepladder: Make sure that it is fully opened. Do not climb a closed stepladder. Make sure that both sides of the stepladder are locked into place. Ask someone to hold it for you, if possible. Clearly mark and make sure that you can see: Any grab bars or handrails. First and last steps. Where the edge of each step is. Use tools that help you move around (mobility aids) if they are needed. These include: Canes. Walkers. Scooters. Crutches. Turn on the lights when you go into a dark area. Replace any light bulbs as soon as they burn out. Set up your furniture so you have a clear path. Avoid moving your furniture around. If any of your floors are uneven, fix them. If there are any pets around you, be aware of where they are. Review your medicines with your doctor. Some medicines can make you feel dizzy. This can increase your chance of falling. Ask your doctor what other things that you can do to help prevent falls. This information is not intended to replace advice given to you by your health care provider. Make sure you discuss any questions you have with your health care provider. Document Released: 01/24/2009 Document Revised: 09/05/2015 Document Reviewed: 05/04/2014 Elsevier Interactive Patient Education  2017 [employer].

## 2022-01-21 NOTE — Progress Notes (Signed)
Subjective:   Jeffery George is a 76 y.o. male who presents for Medicare Annual/Subsequent preventive examination.   Virtual Visit via Telephone Note  I connected with  Jeffery George on 01/21/22 at 12:45 PM EDT by telephone and verified that I am speaking with the correct person using two identifiers.  Location: Patient: home  Provider: [place]  Persons participating in the virtual visit: patient/Nurse Health Advisor   I discussed the limitations, risks, security and privacy concerns of performing an evaluation and management service by telephone and the availability of in person appointments. The patient expressed understanding and agreed to proceed.  Interactive audio and video telecommunications were attempted between this nurse and patient, however failed, due to patient having technical difficulties OR patient did not have access to video capability.  We continued and completed visit with audio only.  Some vital signs may be absent or patient reported.   Daphane Shepherd, LPN  Review of Systems     Cardiac Risk Factors include: advanced age (>47mn, >>13women);diabetes mellitus;hypertension;male gender     Objective:    Today's Vitals   01/21/22 1255  Weight: 260 lb (117.9 kg)  Height: 6' (1.829 m)   Body mass index is 35.26 kg/m.     01/21/2022    1:00 PM 07/09/2021    3:47 PM 09/20/2020   12:42 PM 08/12/2017   10:37 AM 08/20/2016    2:37 PM 08/01/2015    7:37 AM 07/18/2015   12:41 PM  Advanced Directives  Does Patient Have a Medical Advance Directive? No No No No No No No  Copy of Healthcare Power of Attorney in Chart?      No - copy requested   Would patient like information on creating a medical advance directive? No - Patient declined   No - Patient declined   No - patient declined information    Current Medications (verified) Outpatient Encounter Medications as of 01/21/2022  Medication Sig   Accu-Chek Softclix Lancets lancets Test blood sugar once  daily. Dx E11.9   amLODipine (NORVASC) 5 MG tablet Take 1 tablet (5 mg total) by mouth daily.   atorvastatin (LIPITOR) 10 MG tablet Take 1 tablet (10 mg total) by mouth daily.   blood glucose meter kit and supplies TrueMetrix, TruTrack, Accu-Chek Aviva Expert, Accu-Chek Aviva Plus, Accu-Chek Guide, Accu-Chek Nano, or Accu-Chek Smartview.   Use once per day.   Blood Glucose Monitoring Suppl (BLOOD GLUCOSE MONITOR KIT) KIT Use to test blood sugar twice daily.   carvedilol (COREG) 6.25 MG tablet Take 1 tablet (6.25 mg total) by mouth 2 (two) times daily.   clopidogrel (PLAVIX) 75 MG tablet Take 1 tablet (75 mg total) by mouth daily.   glucose blood (TRUE METRIX BLOOD GLUCOSE TEST) test strip 1 each by Other route 3 (three) times daily. Use as instructed   Lancets (ONETOUCH ULTRASOFT) lancets Use as instructed   latanoprost (XALATAN) 0.005 % ophthalmic solution Place 1 drop into both eyes every evening.   losartan (COZAAR) 100 MG tablet Take 1 tablet (100 mg total) by mouth daily.   metFORMIN (GLUCOPHAGE) 500 MG tablet Take 2 tablets (1,000 mg total) by mouth 2 (two) times daily with a meal. Initially 2 tablets in am, 1 at bedtime, increase to 2 tabs BID after 1 week if still elevated readings and tolerated.   Omega-3 Fatty Acids (FISH OIL) 1000 MG CAPS Take by mouth daily.    TRUEplus Lancets 33G MISC 1 each by Does not apply  route in the morning, at noon, and at bedtime.   No facility-administered encounter medications on file as of 01/21/2022.    Allergies (verified) Patient has no known allergies.   History: Past Medical History:  Diagnosis Date   Arthritis    Diabetes mellitus without complication ([place])    Heart murmur    Hypertension    Past Surgical History:  Procedure Laterality Date   CATARACT EXTRACTION     FRACTURE SURGERY     Lt ankle   GAS/FLUID EXCHANGE Left 08/12/2017   Procedure: C3F8 GAS/FLUID EXCHANGE;  Surgeon: Bernarda Caffey, MD;  Location: [facility];  Service:  Ophthalmology;  Laterality: Left;   HERNIA REPAIR     IRIDOTOMY / IRIDECTOMY Bilateral    stents   VASECTOMY     VITRECTOMY 25 GAUGE WITH SCLERAL BUCKLE Left 08/12/2017   Procedure: VITRECTOMY 25 GAUGE WITH SCLERAL BUCKLE WITH ENDOLASER;  Surgeon: Bernarda Caffey, MD;  Location: [facility];  Service: Ophthalmology;  Laterality: Left;   Family History  Problem Relation Age of Onset   Cancer Mother    Diabetes Mother    Stroke Mother    Heart disease Father    Stroke Father    Diabetes Brother    Heart disease Brother    Diabetes Brother    Colon cancer Neg Hx    Social History   Socioeconomic History   Marital status: Married    Spouse name: Not on file   Number of children: Not on file   Years of education: Not on file   Highest education level: Not on file  Occupational History   Occupation: Educational psychologist  Tobacco Use   Smoking status: Former   Smokeless tobacco: Never  Substance and Sexual Activity   Alcohol use: No    Alcohol/week: 0.0 standard drinks of alcohol   Drug use: No   Sexual activity: Never  Other Topics Concern   Not on file  Social History Narrative   Married   Counsellor at [employer] Strain: [place]  (01/21/2022)   Overall Financial Resource Strain (CARDIA)    Difficulty of Paying Living Expenses: Not hard at all  Food Insecurity: No Food Insecurity (01/21/2022)   Hunger Vital Sign    Worried About Running Out of Food in the Last Year: Never true    [place] in the Last Year: Never true  Transportation Needs: No Transportation Needs (01/21/2022)   PRAPARE - Hydrologist (Medical): No    Lack of Transportation (Non-Medical): No  Physical Activity: Insufficiently Active (01/21/2022)   Exercise Vital Sign    Days of Exercise per Week: 3 days    Minutes of Exercise per Session: 30 min  Stress: No Stress Concern Present (01/21/2022)   [place]    Feeling of Stress : Not at all  Social Connections: Moderately Isolated (01/21/2022)   Social Connection and Isolation Panel [NHANES]    Frequency of Communication with Friends and Family: More than three times a week    Frequency of Social Gatherings with Friends and Family: More than three times a week    Attends Religious Services: Never    Marine scientist or Organizations: No    Attends Archivist Meetings: Never    Marital Status: Married    Tobacco Counseling Counseling given: Not Answered  Clinical Intake:  Pre-visit preparation completed: Yes  Pain : No/denies pain     Nutritional Risks: None Diabetes: No  How often do you need to have someone help you when you read instructions, pamphlets, or other written materials from your doctor or pharmacy?: 1 - Never  Diabetic?yes  Nutrition Risk Assessment:  Has the patient had any N/V/D within the last 2 months?  No  Does the patient have any non-healing wounds?  No  Has the patient had any unintentional weight loss or weight gain?  No   Diabetes:  Is the patient diabetic?  Yes  If diabetic, was a CBG obtained today?  No  Did the patient bring in their glucometer from home?  No  How often do you monitor your CBG's? Daily .   Financial Strains and Diabetes Management:  Are you having any financial strains with the device, your supplies or your medication? No .  Does the patient want to be seen by Chronic Care Management for management of their diabetes?  No  Would the patient like to be referred to a Nutritionist or for Diabetic Management?  No   Diabetic Exams:  Diabetic Eye Exam: Completed 11/2021 Diabetic Foot Exam: Overdue, Pt has been advised about the importance in completing this exam. Pt is scheduled for diabetic foot exam on next office visit .   Interpreter Needed?: No  Information entered by :: Jadene Pierini,  LPN   Activities of Daily Living    01/21/2022    1:00 PM 12/11/2021   10:53 AM  In your present state of health, do you have any difficulty performing the following activities:  Hearing? 0 0   0  Vision? 0 0   0  Difficulty concentrating or making decisions? 0 1   1  Walking or climbing stairs? 0 0   0  Dressing or bathing? 0 0   0  Doing errands, shopping? 0 0   0  Preparing Food and eating ? N N   N  Using the Toilet? N N   N  In the past six months, have you accidently leaked urine? N Y   Y  Do you have problems with loss of bowel control? N N   N  Managing your Medications? N N   N  Managing your Finances? N N   N  Housekeeping or managing your Housekeeping? Carloyn Manner    Patient Care Team: [name], MD as PCP - General (Family Medicine) Vickie Epley, MD as PCP - Electrophysiology (Cardiology) Nahser, Wonda Cheng, MD as PCP - Cardiology (Cardiology)  Indicate any recent Medical Services you may have received from other than [facility] providers in the past year (date may be approximate).     Assessment:   This is a routine wellness examination for [facility].  Hearing/Vision screen Vision Screening - Comments:: Annual eye exams wear glasses   Dietary issues and exercise activities discussed: Current Exercise Habits: Home exercise routine, Type of exercise: walking, Time (Minutes): 30, Frequency (Times/Week): 3, Weekly Exercise (Minutes/Week): 90, Intensity: Mild, Exercise limited by: None identified   Goals Addressed             This Visit's Progress    Exercise 3x per week (30 min per time)         Depression Screen    01/21/2022   12:59 PM 12/10/2021   10:18 AM 09/05/2021   10:22 AM 08/05/2021    9:16 AM 07/09/2021  11:59 AM 08/29/2020    1:03 PM 07/04/2020    8:46 AM  PHQ 2/9 Scores  PHQ - 2 Score 0 0 1 0 1 0 0  PHQ- 9 Score 0 0 3 0 4      Fall Risk    01/21/2022   12:57 PM 12/11/2021   10:53 AM 12/10/2021   10:18 AM 09/05/2021    10:22 AM 08/05/2021    9:15 AM  Fall Risk   Falls in the past year? 0 0   0 0 0 0  Number falls in past yr: 0 1   1 0 0 0  Injury with Fall? 0 0   0 0 0 0  Risk for fall due to : No Fall Risks  No Fall Risks No Fall Risks No Fall Risks  Follow up Falls prevention discussed  Falls evaluation completed Falls evaluation completed Falls evaluation completed    FALL RISK PREVENTION PERTAINING TO THE HOME:  Any stairs in or around the home? Yes  If so, are there any without handrails? No  Home free of loose throw rugs in walkways, pet beds, electrical cords, etc? Yes  Adequate lighting in your home to reduce risk of falls? Yes   ASSISTIVE DEVICES UTILIZED TO PREVENT FALLS:  Life alert? No  Use of a cane, walker or w/c? No  Grab bars in the bathroom? No  Shower chair or bench in shower? No  Elevated toilet seat or a handicapped toilet? No          01/21/2022    1:01 PM  6CIT Screen  What Year? 0 points  What month? 0 points  What time? 0 points  Count back from 20 0 points  Months in reverse 0 points  Repeat phrase 2 points  Total Score 2 points    Immunizations Immunization History  Administered Date(s) Administered   Fluad Quad(high Dose 65+) 12/20/2019   Influenza, High Dose Seasonal PF 01/18/2017, 12/10/2017   Influenza,inj,Quad PF,6+ Mos 02/27/2013, 06/11/2014, 12/10/2014, 02/03/2016   Influenza-Unspecified 01/18/2017, 01/11/2019   Pneumococcal Conjugate-13 06/12/2015   Pneumococcal Polysaccharide-23 05/01/2013   Tdap 05/01/2013   Zoster, Live 05/09/2013    TDAP status: Up to date  Flu Vaccine status: Due, Education has been provided regarding the importance of this vaccine. Advised may receive this vaccine at local pharmacy or Health Dept. Aware to provide a copy of the vaccination record if obtained from local pharmacy or Health Dept. Verbalized acceptance and understanding.  Pneumococcal vaccine status: Up to date  Covid-19 vaccine status: Completed  vaccines  Qualifies for Shingles Vaccine? Yes   Zostavax completed No   Shingrix Completed?: No.    Education has been provided regarding the importance of this vaccine. Patient has been advised to call insurance company to determine out of pocket expense if they have not yet received this vaccine. Advised may also receive vaccine at local pharmacy or Health Dept. Verbalized acceptance and understanding.  Screening Tests Health Maintenance  Topic Date Due   COVID-19 Vaccine (1) Never done   INFLUENZA VACCINE  11/11/2021   Zoster Vaccines- Shingrix (1 of 2) 03/12/2022 (Originally 09/[birth year]/1996)   HEMOGLOBIN A1C  06/11/2022   OPHTHALMOLOGY EXAM  06/27/2022   Diabetic kidney evaluation - Urine ACR  07/26/2022   FOOT EXAM  07/26/2022   Diabetic kidney evaluation - GFR measurement  09/06/2022   TETANUS/TDAP  05/02/2023   COLONOSCOPY (Pts 45-30yr Insurance coverage will need to be confirmed)  07/31/2025   Pneumonia Vaccine  44+ Years old  Completed   Hepatitis C Screening  Completed   HPV VACCINES  Aged Out    Health Maintenance  Health Maintenance Due  Topic Date Due   COVID-19 Vaccine (1) Never done   INFLUENZA VACCINE  11/11/2021    Colorectal cancer screening: No longer required.   Lung Cancer Screening: (Low Dose CT Chest recommended if Age 27-80 years, 30 pack-year currently smoking OR have quit w/in 15years.) does not qualify.   Lung Cancer Screening Referral: n/a  Additional Screening:  Hepatitis C Screening: does not qualify;   Vision Screening: Recommended annual ophthalmology exams for early detection of glaucoma and other disorders of the eye. Is the patient up to date with their annual eye exam?  Yes  Who is the provider or what is the name of the office in which the patient attends annual eye exams? Dr.Dunn  If pt is not established with a provider, would they like to be referred to a provider to establish care? No .   Dental Screening: Recommended annual dental  exams for proper oral hygiene  Community Resource Referral / Chronic Care Management: CRR required this visit?  No   CCM required this visit?  No      Plan:     I have personally reviewed and noted the following in the patient's chart:   Medical and social history Use of alcohol, tobacco or illicit drugs  Current medications and supplements including opioid prescriptions. Patient is not currently taking opioid prescriptions. Functional ability and status Nutritional status Physical activity Advanced directives List of other physicians Hospitalizations, surgeries, and ER visits in previous 12 months Vitals Screenings to include cognitive, depression, and falls Referrals and appointments  In addition, I have reviewed and discussed with patient certain preventive protocols, quality metrics, and best practice recommendations. A written personalized care plan for preventive services as well as general preventive health recommendations were provided to patient.     Daphane Shepherd, LPN   [id number]   Nurse Notes: Due flu Vaccine

## 2022-01-22 ENCOUNTER — Other Ambulatory Visit: Payer: Self-pay | Admitting: Family Medicine

## 2022-01-22 DIAGNOSIS — E1169 Type 2 diabetes mellitus with other specified complication: Secondary | ICD-10-CM

## 2022-01-26 ENCOUNTER — Telehealth: Payer: Self-pay | Admitting: Family Medicine

## 2022-01-26 ENCOUNTER — Other Ambulatory Visit: Payer: Self-pay

## 2022-01-26 DIAGNOSIS — E1169 Type 2 diabetes mellitus with other specified complication: Secondary | ICD-10-CM

## 2022-01-26 MED ORDER — METFORMIN HCL 500 MG PO TABS: ORAL_TABLET | ORAL | 0 refills | Status: DC

## 2022-01-26 NOTE — Telephone Encounter (Signed)
Caller name: Kyree Adriano   On DPR? :yes/no: Yes  Call back number: [phone]  Provider they see:  Carlota Raspberry   Reason for call: Pt called stating that Dr.Greene increased his metformin 500 mg to four times a day. Pt states that his pharmacy need a script for the metformin 500 mg

## 2022-01-26 NOTE — Telephone Encounter (Signed)
Called patient, no answer, lm to inform him the Rx had been updated

## 2022-01-30 ENCOUNTER — Other Ambulatory Visit: Payer: Self-pay | Admitting: Family Medicine

## 2022-01-30 DIAGNOSIS — I10 Essential (primary) hypertension: Secondary | ICD-10-CM

## 2022-01-30 DIAGNOSIS — E785 Hyperlipidemia, unspecified: Secondary | ICD-10-CM

## 2022-03-18 ENCOUNTER — Encounter: Payer: Self-pay | Admitting: Family Medicine

## 2022-03-18 ENCOUNTER — Ambulatory Visit (INDEPENDENT_AMBULATORY_CARE_PROVIDER_SITE_OTHER): Payer: Medicare HMO | Admitting: Family Medicine

## 2022-03-18 VITALS — BP 140/72 | HR 69 | Temp 98.7°F | Ht 72.0 in | Wt 262.8 lb

## 2022-03-18 DIAGNOSIS — E1169 Type 2 diabetes mellitus with other specified complication: Secondary | ICD-10-CM

## 2022-03-18 DIAGNOSIS — I10 Essential (primary) hypertension: Secondary | ICD-10-CM | POA: Diagnosis not present

## 2022-03-18 DIAGNOSIS — E785 Hyperlipidemia, unspecified: Secondary | ICD-10-CM | POA: Diagnosis not present

## 2022-03-18 DIAGNOSIS — G459 Transient cerebral ischemic attack, unspecified: Secondary | ICD-10-CM | POA: Diagnosis not present

## 2022-03-18 DIAGNOSIS — Z23 Encounter for immunization: Secondary | ICD-10-CM

## 2022-03-18 DIAGNOSIS — E669 Obesity, unspecified: Secondary | ICD-10-CM

## 2022-03-18 LAB — COMPREHENSIVE METABOLIC PANEL
ALT: 24 U/L (ref 0–53)
AST: 21 U/L (ref 0–37)
Albumin: 4.7 g/dL (ref 3.5–5.2)
Alkaline Phosphatase: 41 U/L (ref 39–117)
BUN: 18 mg/dL (ref 6–23)
CO2: 28 mEq/L (ref 19–32)
Calcium: 9.7 mg/dL (ref 8.4–10.5)
Chloride: 102 mEq/L (ref 96–112)
Creatinine, Ser: 0.91 mg/dL (ref 0.40–1.50)
GFR: 82.43 mL/min (ref >60.00)
Glucose, Bld: 158 mg/dL — ABNORMAL HIGH (ref 70–99)
Potassium: 4.2 mEq/L (ref 3.5–5.1)
Sodium: 138 mEq/L (ref 135–145)
Total Bilirubin: 0.7 mg/dL (ref 0.2–1.2)
Total Protein: 7.7 g/dL (ref 6.0–8.3)

## 2022-03-18 LAB — HEMOGLOBIN A1C: Hgb A1c MFr Bld: 7.1 % — ABNORMAL HIGH (ref 4.6–6.5)

## 2022-03-18 MED ORDER — CLOPIDOGREL BISULFATE 75 MG PO TABS: 75.0000 mg | ORAL_TABLET | Freq: Every day | ORAL | 1 refills | Status: DC

## 2022-03-18 MED ORDER — METFORMIN HCL 1000 MG PO TABS: 1000.0000 mg | ORAL_TABLET | Freq: Two times a day (BID) | ORAL | 1 refills | Status: DC

## 2022-03-18 MED ORDER — AMLODIPINE BESYLATE 2.5 MG PO TABS: 2.5000 mg | ORAL_TABLET | Freq: Every day | ORAL | 1 refills | Status: DC

## 2022-03-18 NOTE — Patient Instructions (Addendum)
No changes for now but if stomach issues return, let me know and we can lower the metformin dose and look at other options. No change in dose for now. Variable readings are likely due to foods eaten. See info on diabetes and nutrition below. Depending on A1c today, may need to look at other meds. I will let you know.  Please follow-up if any worsening of appetite or new symptoms.  Blood pressure is running a little bit too high today.  Add additional 2.5 mg of amlodipine once per day.  Continue the 5 mg every day as well.  If any new leg swelling or worsening leg swelling, let me know and we can make further changes.  Flu vaccine today I do recommend Covid vaccine at your pharmacy.    Diabetes Mellitus and Nutrition, Adult When you have diabetes, or diabetes mellitus, it is very important to have healthy eating habits because your blood sugar (glucose) levels are greatly affected by what you eat and drink. Eating healthy foods in the right amounts, at about the same times every day, can help you: Manage your blood glucose. Lower your risk of heart disease. Improve your blood pressure. Reach or maintain a healthy weight. What can affect my meal plan? Every person with diabetes is different, and each person has different needs for a meal plan. Your health care provider may recommend that you work with a dietitian to make a meal plan that is best for you. Your meal plan may vary depending on factors such as: The calories you need. The medicines you take. Your weight. Your blood glucose, blood pressure, and cholesterol levels. Your activity level. Other health conditions you have, such as heart or kidney disease. How do carbohydrates affect me? Carbohydrates, also called carbs, affect your blood glucose level more than any other type of food. Eating carbs raises the amount of glucose in your blood. It is important to know how many carbs you can safely have in each meal. This is different for  every person. Your dietitian can help you calculate how many carbs you should have at each meal and for each snack. How does alcohol affect me? Alcohol can cause a decrease in blood glucose (hypoglycemia), especially if you use insulin or take certain diabetes medicines by mouth. Hypoglycemia can be a life-threatening condition. Symptoms of hypoglycemia, such as sleepiness, dizziness, and confusion, are similar to symptoms of having too much alcohol. Do not drink alcohol if: Your health care provider tells you not to drink. You are pregnant, may be pregnant, or are planning to become pregnant. If you drink alcohol: Limit how much you have to: 0-1 drink a day for women. 0-2 drinks a day for men. Know how much alcohol is in your drink. In the [place] one drink equals one 12 oz bottle of beer (355 mL), one 5 oz glass of wine (148 mL), or one 1 oz glass of hard liquor (44 mL). Keep yourself hydrated with water, diet soda, or unsweetened iced tea. Keep in mind that regular soda, juice, and other mixers may contain a lot of sugar and must be counted as carbs. What are tips for following this plan?  Reading food labels Start by checking the serving size on the Nutrition Facts label of packaged foods and drinks. The number of calories and the amount of carbs, fats, and other nutrients listed on the label are based on one serving of the item. Many items contain more than one serving per package. Check  the total grams (g) of carbs in one serving. Check the number of grams of saturated fats and trans fats in one serving. Choose foods that have a low amount or none of these fats. Check the number of milligrams (mg) of salt (sodium) in one serving. Most people should limit total sodium intake to less than 2,300 mg per day. Always check the nutrition information of foods labeled as "low-fat" or "nonfat." These foods may be higher in added sugar or refined carbs and should be avoided. Talk to your dietitian to  identify your daily goals for nutrients listed on the label. Shopping Avoid buying canned, pre-made, or processed foods. These foods tend to be high in fat, sodium, and added sugar. Shop around the outside edge of the grocery store. This is where you will most often find fresh fruits and vegetables, bulk grains, fresh meats, and fresh dairy products. Cooking Use low-heat cooking methods, such as baking, instead of high-heat cooking methods, such as deep frying. Cook using healthy oils, such as olive, canola, or sunflower oil. Avoid cooking with butter, cream, or high-fat meats. Meal planning Eat meals and snacks regularly, preferably at the same times every day. Avoid going long periods of time without eating. Eat foods that are high in fiber, such as fresh fruits, vegetables, beans, and whole grains. Eat 4-6 oz (112-168 g) of lean protein each day, such as lean meat, chicken, fish, eggs, or tofu. One ounce (oz) (28 g) of lean protein is equal to: 1 oz (28 g) of meat, chicken, or fish. 1 egg.  cup (62 g) of tofu. Eat some foods each day that contain healthy fats, such as avocado, nuts, seeds, and fish. What foods should I eat? Fruits Berries. Apples. Oranges. Peaches. Apricots. Plums. Grapes. Mangoes. Papayas. Pomegranates. Kiwi. Cherries. Vegetables Leafy greens, including lettuce, spinach, kale, chard, collard greens, mustard greens, and cabbage. Beets. Cauliflower. Broccoli. Carrots. Green beans. Tomatoes. Peppers. Onions. Cucumbers. Brussels sprouts. Grains Whole grains, such as whole-wheat or whole-grain bread, crackers, tortillas, cereal, and pasta. Unsweetened oatmeal. Quinoa. Brown or wild rice. Meats and other proteins Seafood. Poultry without skin. Lean cuts of poultry and beef. Tofu. Nuts. Seeds. Dairy Low-fat or fat-free dairy products such as milk, yogurt, and cheese. The items listed above may not be a complete list of foods and beverages you can eat and drink. Contact a  dietitian for more information. What foods should I avoid? Fruits Fruits canned with syrup. Vegetables Canned vegetables. Frozen vegetables with butter or cream sauce. Grains Refined white flour and flour products such as bread, pasta, snack foods, and cereals. Avoid all processed foods. Meats and other proteins Fatty cuts of meat. Poultry with skin. Breaded or fried meats. Processed meat. Avoid saturated fats. Dairy Full-fat yogurt, cheese, or milk. Beverages Sweetened drinks, such as soda or iced tea. The items listed above may not be a complete list of foods and beverages you should avoid. Contact a dietitian for more information. Questions to ask a health care provider Do I need to meet with a certified diabetes care and education specialist? Do I need to meet with a dietitian? What number can I call if I have questions? When are the best times to check my blood glucose? Where to find more information: American Diabetes Association: diabetes.org Academy of Nutrition and Dietetics: eatright.[employer] of Diabetes and Digestive and Kidney Diseases: [employer] Association of Diabetes Care & Education Specialists: diabeteseducator.org Summary It is important to have healthy eating habits because your blood sugar (  glucose) levels are greatly affected by what you eat and drink. It is important to use alcohol carefully. A healthy meal plan will help you manage your blood glucose and lower your risk of heart disease. Your health care provider may recommend that you work with a dietitian to make a meal plan that is best for you. This information is not intended to replace advice given to you by your health care provider. Make sure you discuss any questions you have with your health care provider. Document Revised: 11/01/2019 Document Reviewed: 11/01/2019 Elsevier Patient Education  [place].

## 2022-03-18 NOTE — Progress Notes (Signed)
Subjective:  Patient ID: Jeffery George, male    DOB: Jan 29, 1946  Age: 76 y.o. MRN: [id number]  CC:  Chief Complaint  Patient presents with   Diabetes    Pt states as the increase of metformin he had bad stomach problems that don't hurt any longer     HPI Jeffery George presents for   Diabetes: With hyperglycemia, obesity, microalbuminuria (3.0 on 07/25/21). treated with metformin.  500 mg twice daily at his last visit in August, Jardiance was cost prohibitive.  We increase his metformin to 1000 mg in the morning, 500 mg at night, with option of 1000 mg twice daily. Did have some gastrointestinal side effects with higher dosing - went to 1035m BID. Stomach cramps, diarrhea, then constipation.  Those sx's resolved after a month. No current side effects.  Home readings fasting 164 today, variable random readings - 97-250.  No sx lows.  Home food prep - wife back at work. Denies food insecurity.  He is on statin with Lipitor 10 mg daily, ARB with losartan 100 mg daily. Some decreased appetite at times but inconsistent.  Still eating something with each meal.  Some days does better than others with appetite. Wt Readings from Last 3 Encounters:  03/18/22 262 lb 12.8 oz (119.2 kg)  01/21/22 260 lb (117.9 kg)  12/10/21 264 lb (119.7 kg)     Microalbumin: 3.0 on 07/25/21.  Optho, foot exam, pneumovax:   Up to date.  Flu vaccine today.  Covid booster: recommended.  Lab Results  Component Value Date   HGBA1C 7.9 (A) 12/10/2021   HGBA1C 7.3 (A) 09/05/2021   HGBA1C 7.9 (H) 07/09/2021   Lab Results  Component Value Date   MICROALBUR 3.0 (H) 07/25/2021   LDLCALC 64 12/10/2021   CREATININE 0.93 09/05/2021    Hypertension: Amlodipine 5 mg daily, losartan 100 mg daily, carvedilol 6.25 mg twice daily.  Cardiology Dr. NAcie Fredrickson Hx of aortic valve stenosis. Appt in April noted.  Coronary CT in May.  History of TIA, treated with Plavix, secondary prevention. No new med side effects. Min  chronic ankle swelling if more active.  Home readings:none Cold symptoms few weeks ago - improved.  BP Readings from Last 3 Encounters:  03/18/22 (!) 140/72  12/10/21 138/76  09/12/21 (!) 172/83   Lab Results  Component Value Date   CREATININE 0.93 09/05/2021   Hyperlipidemia: Lipitor 135mqd. No new myalgias/side effects.  Lab Results  Component Value Date   CHOL 138 12/10/2021   HDL 42.00 12/10/2021   LDLCALC 64 12/10/2021   TRIG 160.0 (H) 12/10/2021   CHOLHDL 3 12/10/2021   Lab Results  Component Value Date   ALT 32 09/05/2021   AST 20 09/05/2021   ALKPHOS 40 09/05/2021   BILITOT 0.9 09/05/2021     History Patient Active Problem List   Diagnosis Date Noted   History of COVID-19 05/08/2020   Cough 05/08/2020   Rotator cuff syndrome, left 11/15/2018   Chronic left shoulder pain 08/09/2018   Left retinal detachment 08/11/2017   Gout 02/27/2013   Essential hypertension, benign 02/27/2013   Type II or unspecified type diabetes mellitus without mention of complication, uncontrolled 02/27/2013   Past Medical History:  Diagnosis Date   Arthritis    Diabetes mellitus without complication ([facility]   Heart murmur    Hypertension    Past Surgical History:  Procedure Laterality Date   CATARACT EXTRACTION     FRACTURE SURGERY     Lt  ankle   GAS/FLUID EXCHANGE Left 08/12/2017   Procedure: C3F8 GAS/FLUID EXCHANGE;  Surgeon: Bernarda Caffey, MD;  Location: [place];  Service: Ophthalmology;  Laterality: Left;   HERNIA REPAIR     IRIDOTOMY / IRIDECTOMY Bilateral    stents   VASECTOMY     VITRECTOMY 25 GAUGE WITH SCLERAL BUCKLE Left 08/12/2017   Procedure: VITRECTOMY 25 GAUGE WITH SCLERAL BUCKLE WITH ENDOLASER;  Surgeon: Bernarda Caffey, MD;  Location: [facility];  Service: Ophthalmology;  Laterality: Left;   No Known Allergies Prior to Admission medications   Medication Sig Start Date End Date Taking? Authorizing Provider  Accu-Chek Softclix Lancets lancets Test blood sugar once  daily. Dx E11.9 12/10/21  Yes [name], MD  amLODipine (NORVASC) 5 MG tablet TAKE 1 TABLET EVERY DAY 01/30/22  Yes [name], MD  atorvastatin (LIPITOR) 10 MG tablet TAKE 1 TABLET EVERY DAY 01/30/22  Yes [name], MD  blood glucose meter kit and supplies TrueMetrix, TruTrack, Accu-Chek Aviva Expert, Accu-Chek Aviva Plus, Accu-Chek Guide, Accu-Chek Nano, or Accu-Chek Smartview.   Use once per day. 07/31/17  Yes [name], MD  Blood Glucose Monitoring Suppl (BLOOD GLUCOSE MONITOR KIT) KIT Use to test blood sugar twice daily. 07/17/13  Yes [name], MD  carvedilol (COREG) 6.25 MG tablet Take 1 tablet (6.25 mg total) by mouth 2 (two) times daily. 08/06/21  Yes Nahser, Wonda Cheng, MD  clopidogrel (PLAVIX) 75 MG tablet Take 1 tablet (75 mg total) by mouth daily. 12/10/21  Yes [name], MD  glucose blood (TRUE METRIX BLOOD GLUCOSE TEST) test strip 1 each by Other route 3 (three) times daily. Use as instructed 08/05/21  Yes Maximiano Coss, NP  Lancets [facility] ULTRASOFT) lancets Use as instructed 06/05/13  Yes [name], MD  latanoprost (XALATAN) 0.005 % ophthalmic solution Place 1 drop into both eyes every evening. 07/23/17  Yes [provider]  losartan (COZAAR) 100 MG tablet TAKE 1 TABLET EVERY DAY 01/30/22  Yes [name], MD  metFORMIN (GLUCOPHAGE) 500 MG tablet Take 2 tablets (1000 mg) in the am and 2 tablets (1000 mg) in the pm 01/26/22  Yes [name], MD  Omega-3 Fatty Acids (FISH OIL) 1000 MG CAPS Take by mouth daily.    Yes [provider]  TRUEplus Lancets 33G MISC 1 each by Does not apply route in the morning, at noon, and at bedtime. 08/05/21  Yes Maximiano Coss, NP   Social History   Socioeconomic History   Marital status: Married    Spouse name: Not on file   Number of children: Not on file   Years of education: Not on file   Highest education level: Not on file  Occupational History   Occupation:  Educational psychologist  Tobacco Use   Smoking status: Former   Smokeless tobacco: Never  Substance and Sexual Activity   Alcohol use: No    Alcohol/week: 0.0 standard drinks of alcohol   Drug use: No   Sexual activity: Never  Other Topics Concern   Not on file  Social History Narrative   Married   Counsellor at [place] Strain: Low Risk  (01/21/2022)   Overall Financial Resource Strain (CARDIA)    Difficulty of Paying Living Expenses: Not hard at all  Food Insecurity: No Food Insecurity (01/21/2022)   Hunger Vital Sign    Worried About Estate manager/land agent of Food  in the Last Year: Never true    [place] in the Last Year: Never true  Transportation Needs: No Transportation Needs (01/21/2022)   PRAPARE - Hydrologist (Medical): No    Lack of Transportation (Non-Medical): No  Physical Activity: Insufficiently Active (01/21/2022)   Exercise Vital Sign    Days of Exercise per Week: 3 days    Minutes of Exercise per Session: 30 min  Stress: No Stress Concern Present (01/21/2022)   [place]    Feeling of Stress : Not at all  Social Connections: Moderately Isolated (01/21/2022)   Social Connection and Isolation Panel [NHANES]    Frequency of Communication with Friends and Family: More than three times a week    Frequency of Social Gatherings with Friends and Family: More than three times a week    Attends Religious Services: Never    Marine scientist or Organizations: No    Attends Archivist Meetings: Never    Marital Status: Married  Human resources officer Violence: Not At Risk (01/21/2022)   Humiliation, Afraid, Rape, and Kick questionnaire    Fear of Current or Ex-Partner: No    Emotionally Abused: No    Physically Abused: No    Sexually Abused: No    Review of Systems  Constitutional:  Negative for  fatigue and unexpected weight change.  Eyes:  Negative for visual disturbance.  Respiratory:  Negative for cough, chest tightness and shortness of breath.   Cardiovascular:  Negative for chest pain, palpitations and leg swelling.  Gastrointestinal:  Negative for abdominal pain and blood in stool.  Neurological:  Negative for dizziness, light-headedness and headaches.     Objective:   Vitals:   03/18/22 1032 03/18/22 1122  BP: (!) 150/78 (!) 140/72  Pulse: 69   Temp: 98.7 F (37.1 C)   SpO2: 94%   Weight: 262 lb 12.8 oz (119.2 kg)   Height: 6' (1.829 m)      Physical Exam Vitals reviewed.  Constitutional:      Appearance: He is well-developed.  HENT:     Head: Normocephalic and atraumatic.  Neck:     Vascular: No carotid bruit or JVD.  Cardiovascular:     Rate and Rhythm: Normal rate and regular rhythm.     Heart sounds: Normal heart sounds. No murmur heard. Pulmonary:     Effort: Pulmonary effort is normal.     Breath sounds: Normal breath sounds. No rales.  Musculoskeletal:     Right lower leg: Edema (Trace at ankles only with some stasis changes.) present.     Left lower leg: Edema present.  Skin:    General: Skin is warm and dry.  Neurological:     Mental Status: He is alert and oriented to person, place, and time.  Psychiatric:        Mood and Affect: Mood normal.        Assessment & Plan:  Jeffery George is a 76 y.o. male . Type 2 diabetes mellitus with obesity ([place]) - Plan: metFORMIN (GLUCOPHAGE) 1000 MG tablet, Hemoglobin A1c  -Initial GI side effects with metformin but those have improved.  We will continue 1000 g twice daily dosing.  Check A1c with med adjustments accordingly.  44-monthfollow-up.  -Notes intermittent decreased appetite, but that is not persistent and weight stable.  RTC precautions if continued appetite difficulty or new symptoms.  Need for influenza vaccination -  Plan: Flu Vaccine QUAD High Dose(Fluad)  TIA (transient ischemic  attack) - Plan: clopidogrel (PLAVIX) 75 MG tablet  -Secondary prevention discussed with management of hyperlipidemia, hypertension, diabetes.  Tolerating Plavix, continue same.  Hyperlipidemia, unspecified hyperlipidemia type - Plan: Comprehensive metabolic panel  -Tolerating statin, continue same, lipids noted from few months ago.  LDL 64.  No med changes for now.  Essential hypertension, benign - Plan: amLODipine (NORVASC) 2.5 MG tablet  -Decreased control.  Add additional 2.5 mg amlodipine for total dose 7.5 mg, cautioned about pedal edema or worsening edema and contact us if that occurs and I will change other medication.  Meds ordered this encounter  Medications   clopidogrel (PLAVIX) 75 MG tablet    Sig: Take 1 tablet (75 mg total) by mouth daily.    Dispense:  90 tablet    Refill:  1   metFORMIN (GLUCOPHAGE) 1000 MG tablet    Sig: Take 1 tablet (1,000 mg total) by mouth 2 (two) times daily with a meal.    Dispense:  180 tablet    Refill:  1   amLODipine (NORVASC) 2.5 MG tablet    Sig: Take 1 tablet (2.5 mg total) by mouth daily. Add to 36m for total 7.520mdose.    Dispense:  90 tablet    Refill:  1   Patient Instructions  No changes for now but if stomach issues return, let me know and we can lower the metformin dose and look at other options. No change in dose for now. Variable readings are likely due to foods eaten. See info on diabetes and nutrition below. Depending on A1c today, may need to look at other meds. I will let you know.  Please follow-up if any worsening of appetite or new symptoms.  Blood pressure is running a little bit too high today.  Add additional 2.5 mg of amlodipine once per day.  Continue the 5 mg every day as well.  If any new leg swelling or worsening leg swelling, let me know and we can make further changes.  Flu vaccine today I do recommend Covid vaccine at your pharmacy.    Diabetes Mellitus and Nutrition, Adult When you have diabetes, or  diabetes mellitus, it is very important to have healthy eating habits because your blood sugar (glucose) levels are greatly affected by what you eat and drink. Eating healthy foods in the right amounts, at about the same times every day, can help you: Manage your blood glucose. Lower your risk of heart disease. Improve your blood pressure. Reach or maintain a healthy weight. What can affect my meal plan? Every person with diabetes is different, and each person has different needs for a meal plan. Your health care provider may recommend that you work with a dietitian to make a meal plan that is best for you. Your meal plan may vary depending on factors such as: The calories you need. The medicines you take. Your weight. Your blood glucose, blood pressure, and cholesterol levels. Your activity level. Other health conditions you have, such as heart or kidney disease. How do carbohydrates affect me? Carbohydrates, also called carbs, affect your blood glucose level more than any other type of food. Eating carbs raises the amount of glucose in your blood. It is important to know how many carbs you can safely have in each meal. This is different for every person. Your dietitian can help you calculate how many carbs you should have at each meal and for each snack. How  does alcohol affect me? Alcohol can cause a decrease in blood glucose (hypoglycemia), especially if you use insulin or take certain diabetes medicines by mouth. Hypoglycemia can be a life-threatening condition. Symptoms of hypoglycemia, such as sleepiness, dizziness, and confusion, are similar to symptoms of having too much alcohol. Do not drink alcohol if: Your health care provider tells you not to drink. You are pregnant, may be pregnant, or are planning to become pregnant. If you drink alcohol: Limit how much you have to: 0-1 drink a day for women. 0-2 drinks a day for men. Know how much alcohol is in your drink. In the [place] one  drink equals one 12 oz bottle of beer (355 mL), one 5 oz glass of wine (148 mL), or one 1 oz glass of hard liquor (44 mL). Keep yourself hydrated with water, diet soda, or unsweetened iced tea. Keep in mind that regular soda, juice, and other mixers may contain a lot of sugar and must be counted as carbs. What are tips for following this plan?  Reading food labels Start by checking the serving size on the Nutrition Facts label of packaged foods and drinks. The number of calories and the amount of carbs, fats, and other nutrients listed on the label are based on one serving of the item. Many items contain more than one serving per package. Check the total grams (g) of carbs in one serving. Check the number of grams of saturated fats and trans fats in one serving. Choose foods that have a low amount or none of these fats. Check the number of milligrams (mg) of salt (sodium) in one serving. Most people should limit total sodium intake to less than 2,300 mg per day. Always check the nutrition information of foods labeled as "low-fat" or "nonfat." These foods may be higher in added sugar or refined carbs and should be avoided. Talk to your dietitian to identify your daily goals for nutrients listed on the label. Shopping Avoid buying canned, pre-made, or processed foods. These foods tend to be high in fat, sodium, and added sugar. Shop around the outside edge of the grocery store. This is where you will most often find fresh fruits and vegetables, bulk grains, fresh meats, and fresh dairy products. Cooking Use low-heat cooking methods, such as baking, instead of high-heat cooking methods, such as deep frying. Cook using healthy oils, such as olive, canola, or sunflower oil. Avoid cooking with butter, cream, or high-fat meats. Meal planning Eat meals and snacks regularly, preferably at the same times every day. Avoid going long periods of time without eating. Eat foods that are high in fiber, such as  fresh fruits, vegetables, beans, and whole grains. Eat 4-6 oz (112-168 g) of lean protein each day, such as lean meat, chicken, fish, eggs, or tofu. One ounce (oz) (28 g) of lean protein is equal to: 1 oz (28 g) of meat, chicken, or fish. 1 egg.  cup (62 g) of tofu. Eat some foods each day that contain healthy fats, such as avocado, nuts, seeds, and fish. What foods should I eat? Fruits Berries. Apples. Oranges. Peaches. Apricots. Plums. Grapes. Mangoes. Papayas. Pomegranates. Kiwi. Cherries. Vegetables Leafy greens, including lettuce, spinach, kale, chard, collard greens, mustard greens, and cabbage. Beets. Cauliflower. Broccoli. Carrots. Green beans. Tomatoes. Peppers. Onions. Cucumbers. Brussels sprouts. Grains Whole grains, such as whole-wheat or whole-grain bread, crackers, tortillas, cereal, and pasta. Unsweetened oatmeal. Quinoa. Brown or wild rice. Meats and other proteins Seafood. Poultry without skin. Lean cuts of poultry  and beef. Tofu. Nuts. Seeds. Dairy Low-fat or fat-free dairy products such as milk, yogurt, and cheese. The items listed above may not be a complete list of foods and beverages you can eat and drink. Contact a dietitian for more information. What foods should I avoid? Fruits Fruits canned with syrup. Vegetables Canned vegetables. Frozen vegetables with butter or cream sauce. Grains Refined white flour and flour products such as bread, pasta, snack foods, and cereals. Avoid all processed foods. Meats and other proteins Fatty cuts of meat. Poultry with skin. Breaded or fried meats. Processed meat. Avoid saturated fats. Dairy Full-fat yogurt, cheese, or milk. Beverages Sweetened drinks, such as soda or iced tea. The items listed above may not be a complete list of foods and beverages you should avoid. Contact a dietitian for more information. Questions to ask a health care provider Do I need to meet with a certified diabetes care and education  specialist? Do I need to meet with a dietitian? What number can I call if I have questions? When are the best times to check my blood glucose? Where to find more information: American Diabetes Association: diabetes.org Academy of Nutrition and Dietetics: eatright.[employer] of Diabetes and Digestive and Kidney Diseases: AmenCredit.is Association of Diabetes Care & Education Specialists: diabeteseducator.org Summary It is important to have healthy eating habits because your blood sugar (glucose) levels are greatly affected by what you eat and drink. It is important to use alcohol carefully. A healthy meal plan will help you manage your blood glucose and lower your risk of heart disease. Your health care provider may recommend that you work with a dietitian to make a meal plan that is best for you. This information is not intended to replace advice given to you by your health care provider. Make sure you discuss any questions you have with your health care provider. Document Revised: 11/01/2019 Document Reviewed: 11/01/2019 Elsevier Patient Education  [place],   Merri Ray, MD [place] 03/18/22 1:24 PM

## 2022-04-22 IMAGING — CR DG CHEST 2V
2 series · 2 of 2 positions shown · non-contrast
Comparison: Chest radiograph dated May 29, 2020

CLINICAL DATA: Shortness of breath

EXAM:
CHEST - 2 VIEW

[chest pa]
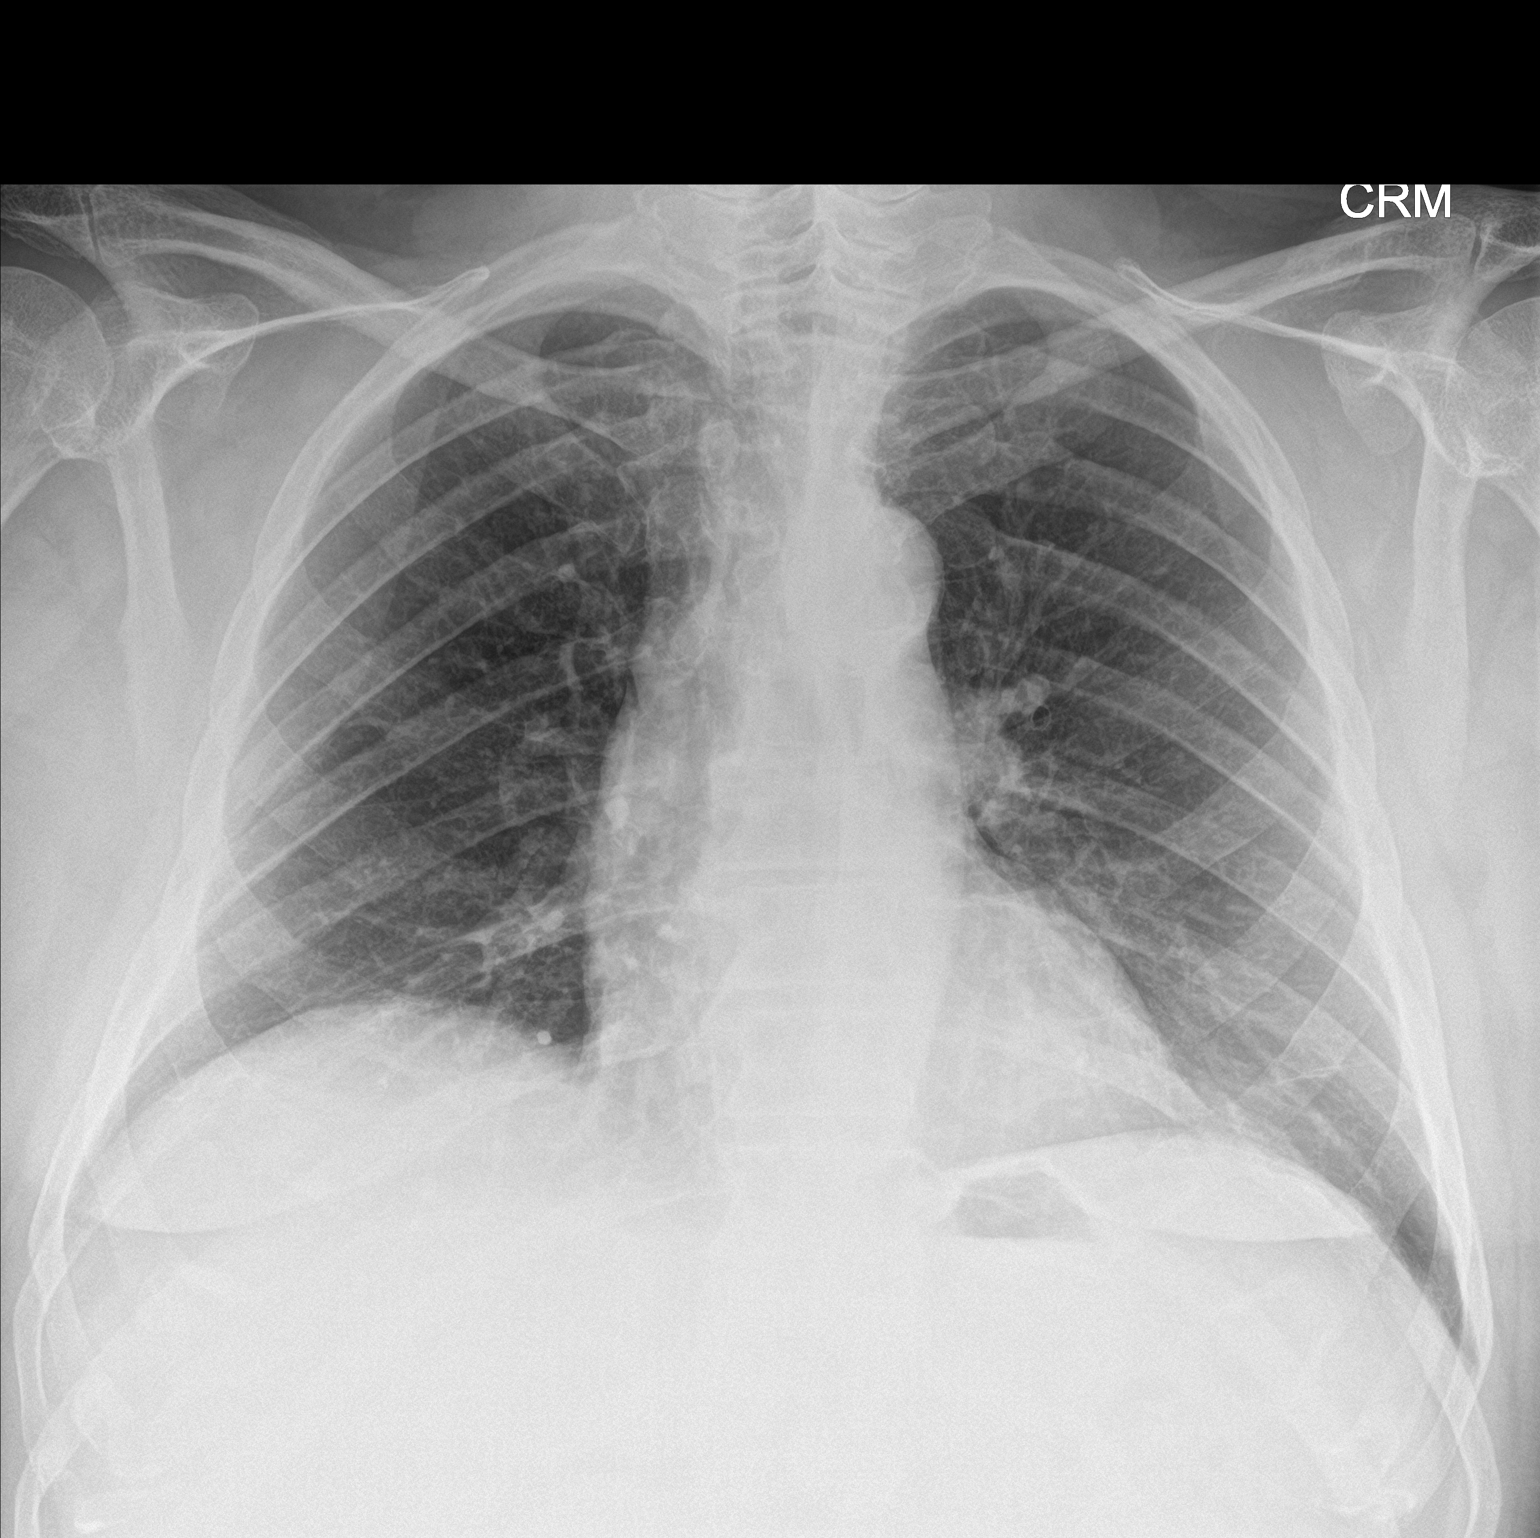

[chest lat]
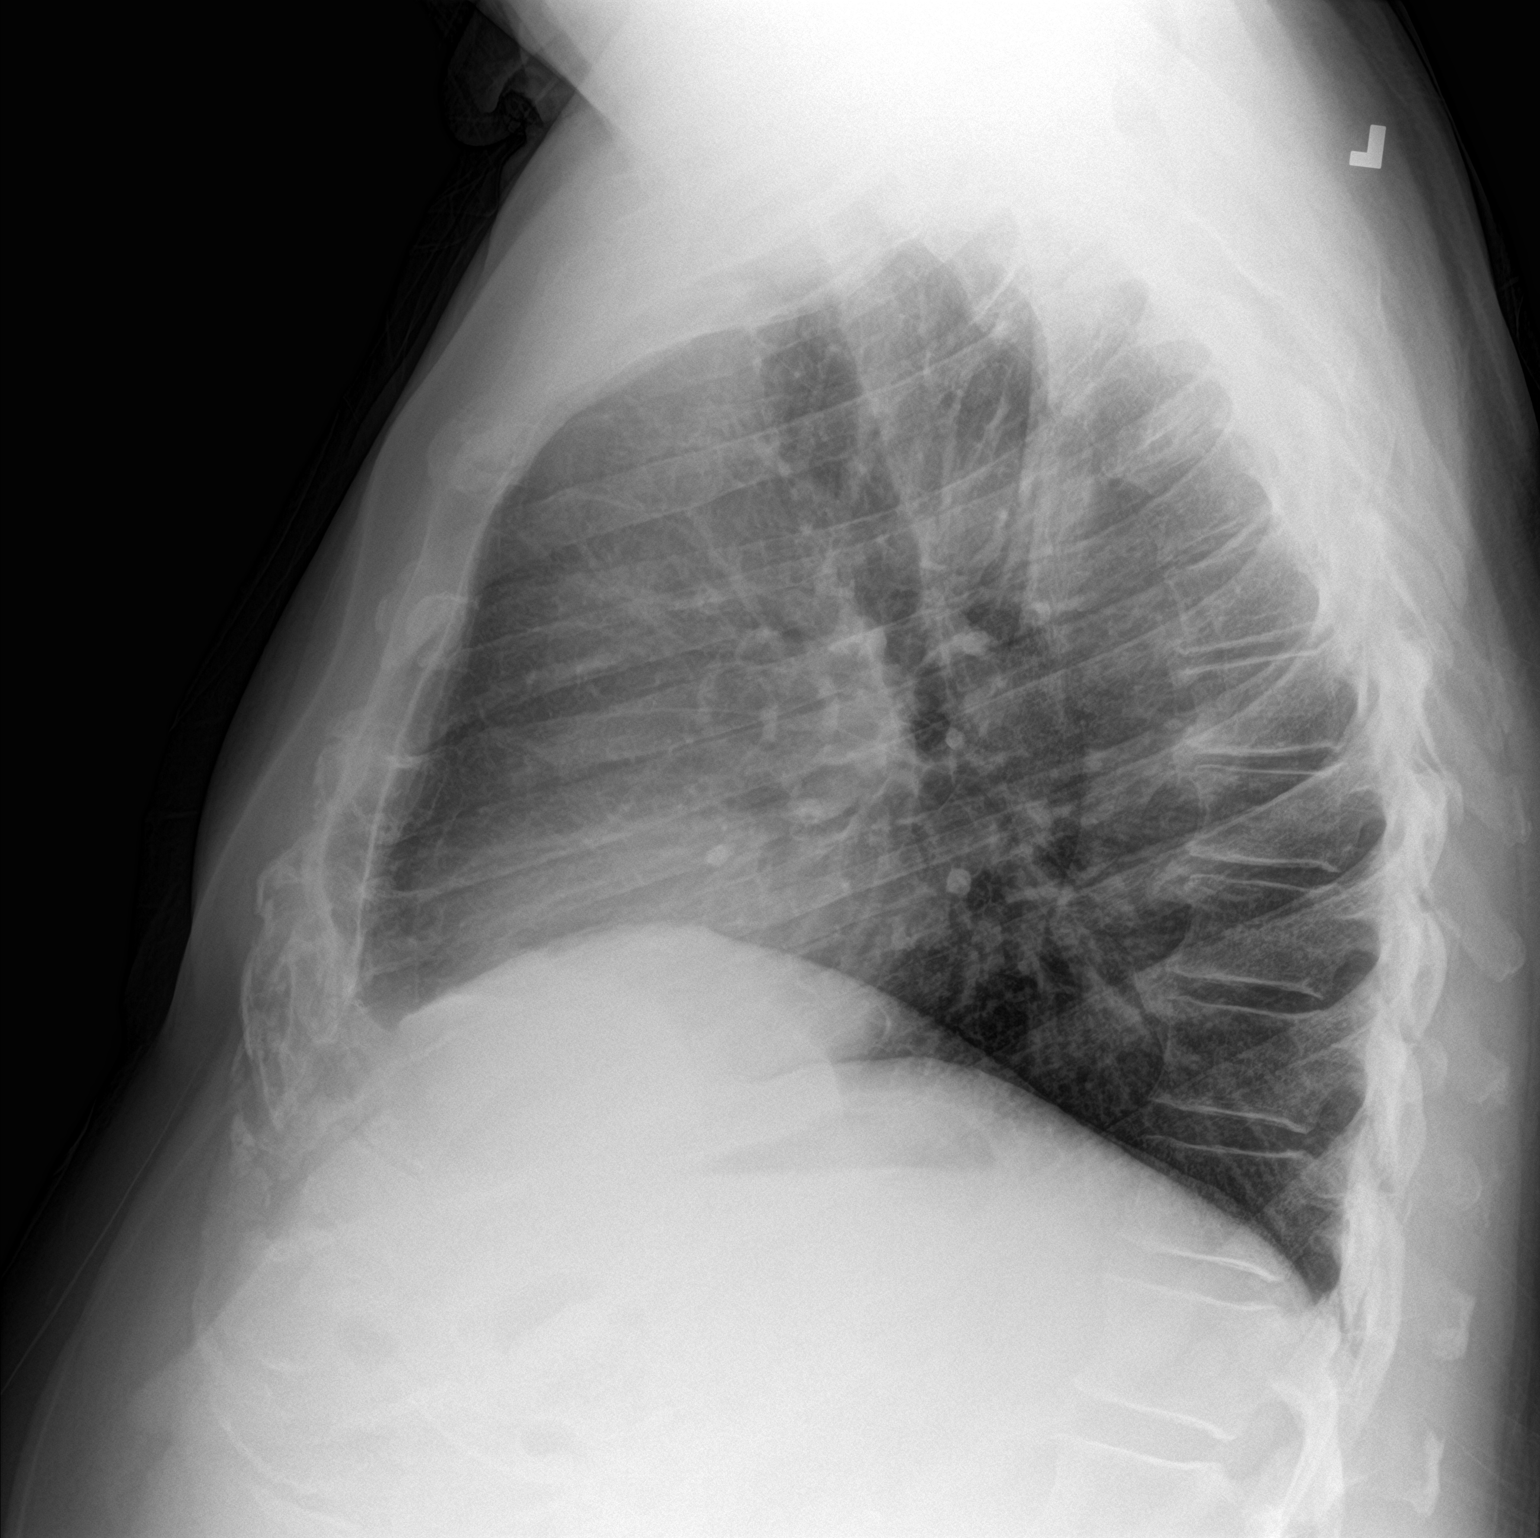

[2 of 2 positions shown; findings below may reference images not displayed]

FINDINGS: The heart size and mediastinal contours are within normal limits.
Both lungs are clear. The visualized skeletal structures are
unremarkable.
IMPRESSION: No active cardiopulmonary disease.

## 2022-06-09 ENCOUNTER — Encounter: Payer: Self-pay | Admitting: Family Medicine

## 2022-06-12 IMAGING — CT CT HEART MORP W/ CTA COR W/ SCORE W/ CA W/CM &/OR W/O CM
1 series · 4 of 6 positions shown, 5 images · IV contrast (omnipaque)
Comparison: None Available.
COMPARISON: None Available.

Addendum:
EXAM:
OVER-READ INTERPRETATION  CT CHEST

The following report is a limited chest CT over-read performed by
08/29/2021. This over-read does not include interpretation of cardiac
or coronary anatomy or pathology. The coronary calcium
score/coronary CTA interpretation by the cardiologist is attached.
HISTORY: 75 yo male with chest pain, nonspecific
Cardiac/Coronary CTA
TECHNIQUE: The patient was scanned on a Siemens Force scanner.
PROTOCOL: A 120 kV prospective scan was triggered in the descending thoracic
aorta at 111 HU's. Axial non-contrast 3 mm slices were carried out
through the heart. The data set was analyzed on a dedicated work
station and scored using the Agatson method. Gantry rotation speed
was 250 msecs and collimation was .6 mm. Beta blockade and 0.8 mg of
sl NTG was given. The 3D data set was reconstructed in 5% intervals
of the 35-75 % of the R-R cycle. Diastolic phases were analyzed on a
dedicated work station using MPR, MIP and VRT modes. The patient
received 100mL OMNIPAQUE IOHEXOL 350 MG/ML SOLN of contrast.

[Series 432: coronaries · 4 of 6 slices shown, 5 images]
[im 2/6  vessel]
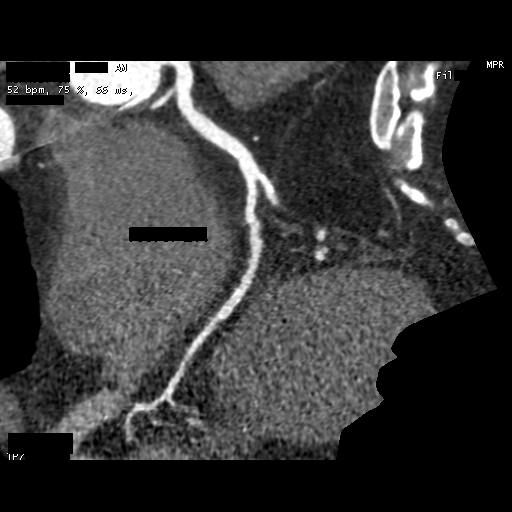
[im 2/6  lung]
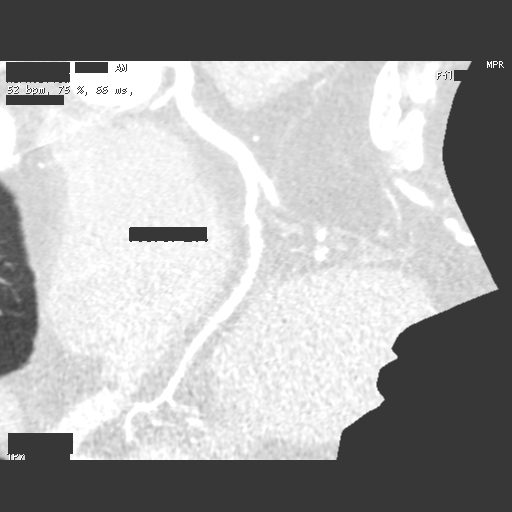
[im 3/6  vessel]
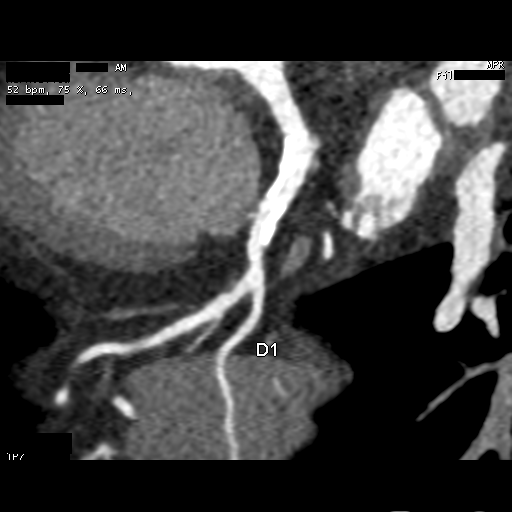
[im 4/6  vessel]
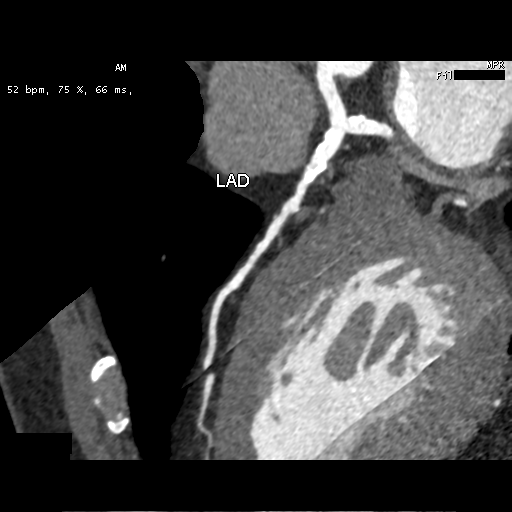
[im 5/6  vessel]
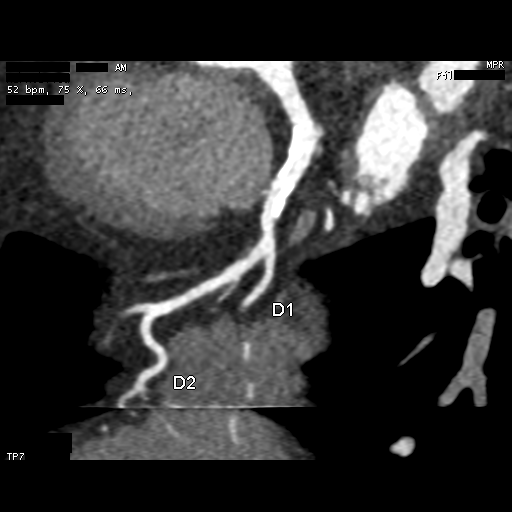

[4 of 6 positions shown; findings below may reference images not displayed]

FINDINGS: Vascular: There are no significant vascular findings.

Mediastinum/Nodes: There are no enlarged lymph nodes.The visualized
esophagus demonstrates no significant findings.

Lungs/Pleura: Clear lungs. No pneumothorax or pleural effusion.

Upper abdomen: No acute abnormality.

Musculoskeletal/Chest wall: No chest wall abnormality. No acute or
significant osseous findings.
IMPRESSION: 1. No significant extracardiac findings.
FINDINGS: Quality: Fair, misregistration artifact, HR 52

Coronary calcium score: The patient's coronary artery calcium score
is 760, which places the patient in the 75th percentile.

Coronary arteries: Normal coronary origins.  Right dominance.

Right Coronary Artery: Dominant. Minimal mixed 1-24% proximal
stenosis (HRORROIY). Normal R-PDA and R-PLB branches.

Left Main Coronary Artery: Normal. Bifurcates into the LAD and LCx
arteries.

Left Anterior Descending Coronary Artery: Large anterior artery that
reaches the apex. There is mild 25-49% proximal mixed stenosis
([id number]). 2 moderate sized diagonal branches without disease.

Left Circumflex Artery: AV groove vessel - mis-registration in the
mid vessel. There is mild 25-49% ostial mixed stenosis ([id number]).

Aorta: Borderline dilated at 39 mm at the mid ascending aorta (level
of the PA bifurcation) measured double oblique. Aortic
atherosclerosis. No dissection.

Aortic Valve: Trileaflet. Heavily calcified along the commissures.
Aortic valve calcium score is 5188

Other findings:

Normal pulmonary vein drainage into the left atrium.

Normal left atrial appendage without a thrombus.

Dilated main pulmonary artery to 32 mm, suggestive of pulmonary
hypertension.
IMPRESSION: 1. Minimal mixed non-obstructive CAD, CADRADS = 2.

2. Coronary calcium score of 760. This was 75th percentile for age
and sex matched control.

3. Normal coronary origin with right dominance.

4. Aortic Valve: Trileaflet. Heavily calcified along the
commissures. Aortic valve calcium score is 5188. Recommend echo
correlation for possible aortic stenosis.

5. Aorta: Borderline dilated at 39 mm at the mid ascending aorta
(level of the PA bifurcation) measured double oblique. Aortic
atherosclerosis. No dissection.

6. Dilated main pulmonary artery to 32 mm, suggestive of pulmonary
hypertension.

*** End of Addendum ***
EXAM:
OVER-READ INTERPRETATION  CT CHEST

The following report is a limited chest CT over-read performed by
08/29/2021. This over-read does not include interpretation of cardiac
or coronary anatomy or pathology. The coronary calcium
score/coronary CTA interpretation by the cardiologist is attached.
FINDINGS: Vascular: There are no significant vascular findings.

Mediastinum/Nodes: There are no enlarged lymph nodes.The visualized
esophagus demonstrates no significant findings.

Lungs/Pleura: Clear lungs. No pneumothorax or pleural effusion.

Upper abdomen: No acute abnormality.

Musculoskeletal/Chest wall: No chest wall abnormality. No acute or
significant osseous findings.
IMPRESSION: 1. No significant extracardiac findings.

## 2022-06-17 ENCOUNTER — Encounter: Payer: Self-pay | Admitting: Family Medicine

## 2022-06-17 ENCOUNTER — Ambulatory Visit (INDEPENDENT_AMBULATORY_CARE_PROVIDER_SITE_OTHER): Payer: Medicare HMO | Admitting: Family Medicine

## 2022-06-17 VITALS — BP 128/68 | HR 71 | Temp 98.2°F | Ht 72.0 in | Wt 268.4 lb

## 2022-06-17 DIAGNOSIS — R011 Cardiac murmur, unspecified: Secondary | ICD-10-CM

## 2022-06-17 DIAGNOSIS — E669 Obesity, unspecified: Secondary | ICD-10-CM

## 2022-06-17 DIAGNOSIS — R109 Unspecified abdominal pain: Secondary | ICD-10-CM | POA: Diagnosis not present

## 2022-06-17 DIAGNOSIS — I10 Essential (primary) hypertension: Secondary | ICD-10-CM

## 2022-06-17 DIAGNOSIS — I350 Nonrheumatic aortic (valve) stenosis: Secondary | ICD-10-CM | POA: Diagnosis not present

## 2022-06-17 DIAGNOSIS — E1169 Type 2 diabetes mellitus with other specified complication: Secondary | ICD-10-CM

## 2022-06-17 DIAGNOSIS — E785 Hyperlipidemia, unspecified: Secondary | ICD-10-CM

## 2022-06-17 LAB — COMPREHENSIVE METABOLIC PANEL
ALT: 19 U/L (ref 0–53)
AST: 17 U/L (ref 0–37)
Albumin: 4.2 g/dL (ref 3.5–5.2)
Alkaline Phosphatase: 38 U/L — ABNORMAL LOW (ref 39–117)
BUN: 22 mg/dL (ref 6–23)
CO2: 25 mEq/L (ref 19–32)
Calcium: 9.6 mg/dL (ref 8.4–10.5)
Chloride: 106 mEq/L (ref 96–112)
Creatinine, Ser: 0.94 mg/dL (ref 0.40–1.50)
GFR: 79.15 mL/min (ref >60.00)
Glucose, Bld: 148 mg/dL — ABNORMAL HIGH (ref 70–99)
Potassium: 4.3 mEq/L (ref 3.5–5.1)
Sodium: 141 mEq/L (ref 135–145)
Total Bilirubin: 0.5 mg/dL (ref 0.2–1.2)
Total Protein: 7.1 g/dL (ref 6.0–8.3)

## 2022-06-17 LAB — LIPID PANEL
Cholesterol: 122 mg/dL (ref 0–200)
HDL: 37.4 mg/dL — ABNORMAL LOW (ref >39.00)
LDL Cholesterol: 57 mg/dL (ref 0–99)
NonHDL: 84.45
Total CHOL/HDL Ratio: 3
Triglycerides: 138 mg/dL (ref 0.0–149.0)
VLDL: 27.6 mg/dL (ref 0.0–40.0)

## 2022-06-17 LAB — HEMOGLOBIN A1C: Hgb A1c MFr Bld: 6.7 % — ABNORMAL HIGH (ref 4.6–6.5)

## 2022-06-17 MED ORDER — SITAGLIPTIN PHOSPHATE 50 MG PO TABS: 50.0000 mg | ORAL_TABLET | Freq: Every day | ORAL | 1 refills | Status: DC

## 2022-06-17 MED ORDER — METFORMIN HCL 500 MG PO TABS: 500.0000 mg | ORAL_TABLET | Freq: Two times a day (BID) | ORAL | 1 refills | Status: DC

## 2022-06-17 NOTE — Assessment & Plan Note (Signed)
With prior echo as noted last year, asymptomatic.  Continue routine follow-up, monitoring with cardiology.

## 2022-06-17 NOTE — Assessment & Plan Note (Signed)
With abdominal pain, intermittent without concerning symptoms otherwise.  Appears to be after higher dose in the morning.  Likely side effects from metformin.  Decrease metformin to 500 mg twice daily, will try adding Januvia to see if that is cost effective, recheck 1 month with RTC/ER precautions if acute worsening or associated symptoms.  Check labs.

## 2022-06-17 NOTE — Progress Notes (Signed)
Subjective:  Patient ID: Jeffery George, male    DOB: 04-25-1945  Age: 77 y.o. MRN: [id number]  CC:  Chief Complaint  Patient presents with   Diabetes    Pt is doing well.     HPI SING HISE presents for   Diabetes: Complicated by hyperglycemia, obesity, microalbuminuria.  Treated with metformin with increase of dose to 1000 mg twice daily on prior visit.  Some initial gastric side effects that resolved.  A1c improved to 7.1 in December.  Vania Rea has been cost prohibitive. He is on statin with Lipitor 10 mg daily, ARB with losartan 100 mg daily. Home readings fasting: 110-120's Postprandial: 160-170. No 200's No symptomatic lows. Microalbumin: 3.0 on 07/25/2021. Optho, foot exam, pneumovax:  Up-to-date  Some abdominal discomfort - off and on for past 4-5 months. Notices more after higher dose metformin in the morning. Diarrhea few days per week and stomach cramping.  No melena or hematochezia.  No fever, n/v.   Working outside in yard for exercise.    Lab Results  Component Value Date   HGBA1C 7.1 (H) 03/18/2022   HGBA1C 7.9 (A) 12/10/2021   HGBA1C 7.3 (A) 09/05/2021   Lab Results  Component Value Date   MICROALBUR 3.0 (H) 07/25/2021   LDLCALC 64 12/10/2021   CREATININE 0.91 03/18/2022   Hyperlipidemia: Lipitor 10 mg daily, history of TIA.  May 2023 coronary calcium score 760, 75th percentile with mild plaque of LAD, circumflex, RCA but none appeared flow-limiting. EF 65 to 75% on echo in April 2023.  Annual monitoring of aortic valve.  No new myalgias/side effects.  Lab Results  Component Value Date   CHOL 138 12/10/2021   HDL 42.00 12/10/2021   LDLCALC 64 12/10/2021   TRIG 160.0 (H) 12/10/2021   CHOLHDL 3 12/10/2021   Lab Results  Component Value Date   ALT 24 03/18/2022   AST 21 03/18/2022   ALKPHOS 41 03/18/2022   BILITOT 0.7 03/18/2022   Hypertension: Amlodipine 7.5 mg daily, carvedilol 6.25 mg twice daily, losartan 100 mg daily.  History of  TIA treated with Plavix.  Aortic valve stenosis,  cardiology Dr. Acie Fredrickson.  Home readings:none. No new med side effects.  BP Readings from Last 3 Encounters:  06/17/22 128/68  03/18/22 (!) 140/72  12/10/21 138/76   Lab Results  Component Value Date   CREATININE 0.91 03/18/2022       History Patient Active Problem List   Diagnosis Date Noted   History of COVID-19 05/08/2020   Cough 05/08/2020   Rotator cuff syndrome, left 11/15/2018   Chronic left shoulder pain 08/09/2018   Left retinal detachment 08/11/2017   Gout 02/27/2013   Essential hypertension, benign 02/27/2013   Type II or unspecified type diabetes mellitus without mention of complication, uncontrolled 02/27/2013    Past Medical History:  Diagnosis Date   Arthritis    Diabetes mellitus without complication ([place])    Heart murmur    Hypertension       Review of Systems  Constitutional:  Negative for fatigue and unexpected weight change.  Eyes:  Negative for visual disturbance.  Respiratory:  Negative for cough, chest tightness and shortness of breath.   Cardiovascular:  Negative for chest pain, palpitations and leg swelling.  Gastrointestinal:  Positive for abdominal pain. Negative for blood in stool.  Neurological:  Negative for dizziness, light-headedness and headaches.     Objective:   Vitals:   06/17/22 1008 06/17/22 1028  BP: (!) 142/72 128/68  Pulse:  71   Temp: 98.2 F (36.8 C)   TempSrc: Temporal   SpO2: 97%   Weight: 268 lb 6.4 oz (121.7 kg)   Height: 6' (1.829 m)      Physical Exam Vitals reviewed.  Constitutional:      Appearance: He is well-developed.  HENT:     Head: Normocephalic and atraumatic.  Neck:     Vascular: No carotid bruit or JVD.  Cardiovascular:     Rate and Rhythm: Normal rate and regular rhythm.     Heart sounds: Murmur (3/6 SEM) heard.  Pulmonary:     Effort: Pulmonary effort is normal.     Breath sounds: Normal breath sounds. No rales.  Musculoskeletal:      Right lower leg: No edema.     Left lower leg: No edema.  Skin:    General: Skin is warm and dry.  Neurological:     Mental Status: He is alert and oriented to person, place, and time.  Psychiatric:        Mood and Affect: Mood normal.        Assessment & Plan:  NADEEM VAHLE is a 77 y.o. male . Type 2 diabetes mellitus with obesity ([place]) Assessment & Plan: With abdominal pain, intermittent without concerning symptoms otherwise.  Appears to be after higher dose in the morning.  Likely side effects from metformin.  Decrease metformin to 500 mg twice daily, will try adding Januvia to see if that is cost effective, recheck 1 month with RTC/ER precautions if acute worsening or associated symptoms.  Check labs.  Orders: -     Comprehensive metabolic panel -     Hemoglobin A1c -     metFORMIN HCl; Take 1 tablet (500 mg total) by mouth 2 (two) times daily with a meal.  Dispense: 180 tablet; Refill: 1 -     SITagliptin Phosphate; Take 1 tablet (50 mg total) by mouth daily.  Dispense: 90 tablet; Refill: 1  Abdominal pain, unspecified abdominal location  Hyperlipidemia, unspecified hyperlipidemia type Assessment & Plan: Tolerating current dose of statin, continue same with labs pending.  Medication adjustment accordingly.  History of TIA, goal of LDL under 70.  Orders: -     Comprehensive metabolic panel -     Lipid panel  Essential hypertension, benign Assessment & Plan:  Stable, tolerating current regimen. Medications refilled. Labs pending as above.    Orders: -     Comprehensive metabolic panel  Heart murmur  Aortic valve stenosis, etiology of cardiac valve disease unspecified Assessment & Plan: With prior echo as noted last year, asymptomatic.  Continue routine follow-up, monitoring with cardiology.     Patient Instructions  Try decreasing metformin to just one '500mg'$  pill twice per day, and start new med for diabetes - januvia, once per day. Let me know if that is  too costly or any new side effects on that med.  Recheck in 1 month unless any worsening abdominal pain - see me sooner. No other changes today.   Take care.       Signed,   Merri Ray, MD [place] 06/17/22 10:56 AM

## 2022-06-17 NOTE — Assessment & Plan Note (Signed)
Stable, tolerating current regimen. Medications refilled. Labs pending as above.

## 2022-06-17 NOTE — Assessment & Plan Note (Signed)
Tolerating current dose of statin, continue same with labs pending.  Medication adjustment accordingly.  History of TIA, goal of LDL under 70.

## 2022-06-17 NOTE — Patient Instructions (Addendum)
Try decreasing metformin to just one '500mg'$  pill twice per day, and start new med for diabetes - januvia, once per day. Let me know if that is too costly or any new side effects on that med.  Recheck in 1 month unless any worsening abdominal pain - see me sooner. No other changes today.   Take care.

## 2022-06-30 DIAGNOSIS — H401131 Primary open-angle glaucoma, bilateral, mild stage: Secondary | ICD-10-CM | POA: Diagnosis not present

## 2022-07-16 ENCOUNTER — Ambulatory Visit: Payer: Medicare HMO | Admitting: Family Medicine

## 2022-08-12 ENCOUNTER — Other Ambulatory Visit: Payer: Self-pay | Admitting: Family Medicine

## 2022-08-12 DIAGNOSIS — I10 Essential (primary) hypertension: Secondary | ICD-10-CM

## 2022-08-17 ENCOUNTER — Telehealth: Payer: Self-pay | Admitting: Family Medicine

## 2022-08-17 NOTE — Telephone Encounter (Signed)
Pt is asking for a refill on his Carvedilol 6.25 mg but looking thru the med list Dr Shaune Pascal sent this in on 08/07/22. Should we deny ?

## 2022-08-17 NOTE — Telephone Encounter (Signed)
Encourage patient to contact the pharmacy for refills or they can request refills through [facility]   WHAT PHARMACY WOULD THEY LIKE THIS SENT TO:  [facility] - [place], [place] - [address]    MEDICATION NAME & DOSE: carvedilol (COREG) 6.25 MG tablet  NOTES/COMMENTS FROM PATIENT:      Front office please notify patient: It takes 48-72 hours to process rx refill requests Ask patient to call pharmacy to ensure rx is ready before heading there.

## 2022-08-17 NOTE — Telephone Encounter (Signed)
Looks like that was sent to mail order pharmacy, please verify if he has received it.  I am okay with [place] reordering same dose if he has not yet received that medication.

## 2022-08-18 NOTE — Telephone Encounter (Signed)
Spoke with patient and he stated that he was going to get his wife to check if he received through mail order and give the office a call back.

## 2022-08-19 NOTE — Telephone Encounter (Signed)
Called pt left vm to call.

## 2022-08-19 NOTE — Telephone Encounter (Signed)
Left vm to call office about medication refill.

## 2022-08-20 NOTE — Telephone Encounter (Signed)
Called left vm to call the office

## 2022-08-20 NOTE — Telephone Encounter (Signed)
Left vm to office

## 2022-08-21 ENCOUNTER — Telehealth: Payer: Self-pay

## 2022-08-21 ENCOUNTER — Encounter: Payer: Self-pay | Admitting: Family Medicine

## 2022-08-21 NOTE — Telephone Encounter (Signed)
error 

## 2022-08-27 ENCOUNTER — Other Ambulatory Visit: Payer: Self-pay | Admitting: Family Medicine

## 2022-08-27 DIAGNOSIS — G459 Transient cerebral ischemic attack, unspecified: Secondary | ICD-10-CM

## 2022-08-27 DIAGNOSIS — I10 Essential (primary) hypertension: Secondary | ICD-10-CM

## 2022-10-07 ENCOUNTER — Other Ambulatory Visit: Payer: Self-pay | Admitting: Cardiovascular Disease

## 2022-10-07 DIAGNOSIS — R0789 Other chest pain: Secondary | ICD-10-CM

## 2022-10-07 DIAGNOSIS — R079 Chest pain, unspecified: Secondary | ICD-10-CM

## 2022-10-07 DIAGNOSIS — I350 Nonrheumatic aortic (valve) stenosis: Secondary | ICD-10-CM

## 2022-11-02 ENCOUNTER — Encounter: Payer: Self-pay | Admitting: Cardiovascular Disease

## 2022-11-02 NOTE — Progress Notes (Unsigned)
Cardiology Office Note:    Date:  11/03/2022   ID:  JOSAFAT ENRICO, DOB Sep 22, 1945, MRN [id number]  PCP:  Shade Flood, MD   [facility] Providers Cardiologist:  Ardie Mclennan   Electrophysiologist:  Lanier Prude, MD {     Referring MD: Shade Flood, MD   Chief Complaint  Patient presents with   Chest Pain    History of Present Illness:    JI FELDNER is a 77 y.o. male with a hx of HTN, DM, aortic stenosis He was previously seen by Dr. Lalla Brothers a year ago.  He was noted to have a heart murmur.  Echocardiogram revealed mild aortic stenosis.  Echocardiogram this morning reveals normal left ventricular systolic function with an EF of 65 to 70%.  There is mild to moderate aortic stenosis with mean aortic valve gradient of 19 mmHg.  Has had some shortness of breath - present since he had covid a year ago  Also has chest burning  Lots of fatigue  Has to stop to rest doing mild exertion  Has gained some weight .  30 lbs   Has had COVID twice .   Has not received any COVID vaccines.   Watches his salt intake    November 03, 2022 Jeston is seen for follow up of his HTN, DM, AS Coronary CTA on May, 2023 shows non obstructive CAD, CAC score is 760 ( 75th percentile for age / sex matched controls) Borderline dilatation of the ascending aorta - 39 mm  Echo shows normal LV function   moderate  AS, mild AI  Mean gradient is   Past Medical History:  Diagnosis Date   Arthritis    Diabetes mellitus without complication (HCC)    Heart murmur    Hypertension     Past Surgical History:  Procedure Laterality Date   CATARACT EXTRACTION     FRACTURE SURGERY     Lt ankle   GAS/FLUID EXCHANGE Left 08/12/2017   Procedure: C3F8 GAS/FLUID EXCHANGE;  Surgeon: Rennis Chris, MD;  Location: [facility] OR;  Service: Ophthalmology;  Laterality: Left;   HERNIA REPAIR     IRIDOTOMY / IRIDECTOMY Bilateral    stents   VASECTOMY     VITRECTOMY 25 GAUGE WITH SCLERAL BUCKLE Left  08/12/2017   Procedure: VITRECTOMY 25 GAUGE WITH SCLERAL BUCKLE WITH ENDOLASER;  Surgeon: Rennis Chris, MD;  Location: [facility] OR;  Service: Ophthalmology;  Laterality: Left;    Current Medications: Current Meds  Medication Sig   Accu-Chek Softclix Lancets lancets Test blood sugar once daily. Dx E11.9   amLODipine (NORVASC) 2.5 MG tablet TAKE 1 TABLET EVERY DAY. ADD TO 5MG  TAB FOR TOTAL 7.5MG  DOSE   amLODipine (NORVASC) 5 MG tablet TAKE 1 TABLET EVERY DAY   atorvastatin (LIPITOR) 10 MG tablet TAKE 1 TABLET EVERY DAY   blood glucose meter kit and supplies TrueMetrix, TruTrack, Accu-Chek Aviva Expert, Accu-Chek Aviva Plus, Accu-Chek Guide, Accu-Chek Nano, or Accu-Chek Smartview.   Use once per day.   Blood Glucose Monitoring Suppl (BLOOD GLUCOSE MONITOR KIT) KIT Use to test blood sugar twice daily.   clopidogrel (PLAVIX) 75 MG tablet TAKE 1 TABLET (75 MG TOTAL) BY MOUTH DAILY.   glucose blood (TRUE METRIX BLOOD GLUCOSE TEST) test strip 1 each by Other route 3 (three) times daily. Use as instructed   Lancets (ONETOUCH ULTRASOFT) lancets Use as instructed   latanoprost (XALATAN) 0.005 % ophthalmic solution Place 1 drop into both eyes every evening.  losartan (COZAAR) 100 MG tablet TAKE 1 TABLET EVERY DAY   metFORMIN (GLUCOPHAGE) 500 MG tablet Take 1 tablet (500 mg total) by mouth 2 (two) times daily with a meal.   Omega-3 Fatty Acids (FISH OIL) 1000 MG CAPS Take by mouth daily.    sitaGLIPtin (JANUVIA) 50 MG tablet Take 1 tablet (50 mg total) by mouth daily.   TRUEplus Lancets 33G MISC 1 each by Does not apply route in the morning, at noon, and at bedtime.   [DISCONTINUED] carvedilol (COREG) 6.25 MG tablet TAKE 1 TABLET TWICE DAILY     Allergies:   Patient has no known allergies.   Social History   Socioeconomic History   Marital status: Married    Spouse name: Not on file   Number of children: Not on file   Years of education: Not on file   Highest education level: Not on file   Occupational History   Occupation: Pharmacologist  Tobacco Use   Smoking status: Former   Smokeless tobacco: Never  Substance and Sexual Activity   Alcohol use: No    Alcohol/week: 0.0 standard drinks of alcohol   Drug use: No   Sexual activity: Never  Other Topics Concern   Not on file  Social History Narrative   Married   Science writer at [employer]   Social Determinants of Health   Financial Resource Strain: Low Risk  (01/21/2022)   Overall Financial Resource Strain (CARDIA)    Difficulty of Paying Living Expenses: Not hard at all  Food Insecurity: No Food Insecurity (01/21/2022)   Hunger Vital Sign    Worried About Running Out of Food in the Last Year: Never true    Ran Out of Food in the Last Year: Never true  Transportation Needs: No Transportation Needs (01/21/2022)   PRAPARE - Administrator, Civil Service (Medical): No    Lack of Transportation (Non-Medical): No  Physical Activity: Insufficiently Active (01/21/2022)   Exercise Vital Sign    Days of Exercise per Week: 3 days    Minutes of Exercise per Session: 30 min  Stress: No Stress Concern Present (01/21/2022)   [employer] of Occupational Health - Occupational Stress Questionnaire    Feeling of Stress : Not at all  Social Connections: Moderately Isolated (01/21/2022)   Social Connection and Isolation Panel [NHANES]    Frequency of Communication with Friends and Family: More than three times a week    Frequency of Social Gatherings with Friends and Family: More than three times a week    Attends Religious Services: Never    Database administrator or Organizations: No    Attends Engineer, structural: Never    Marital Status: Married     Family History: The patient's family history includes Cancer in his mother; Diabetes in his brother, brother, and mother; Heart disease in his brother and father; Stroke in his father and mother. There is no history of Colon  cancer.  ROS:   Please see the history of present illness.     All other systems reviewed and are negative.  EKGs/Labs/Other Studies Reviewed:    The following studies were reviewed today:   EKG:   EKG Interpretation Date/Time:  Tuesday November 03 2022 15:38:22 EDT Ventricular Rate:  81 PR Interval:  152 QRS Duration:  96 QT Interval:  376 QTC Calculation: 436 R Axis:   -4  Text Interpretation: Normal sinus rhythm Normal ECG When compared with ECG of  09-Jul-2021 16:05, No significant change was found Confirmed by Kristeen Miss [phone]) on 11/03/2022 3:51:45 PM EKG Interpretation Date/Time:  Tuesday November 03 2022 15:38:22 EDT Ventricular Rate:  81 PR Interval:  152 QRS Duration:  96 QT Interval:  376 QTC Calculation: 436 R Axis:   -4  Text Interpretation: Normal sinus rhythm Normal ECG When compared with ECG of 09-Jul-2021 16:05, No significant change was found Confirmed by Kristeen Miss [phone]) on 11/03/2022 3:51:45 PM      Recent Labs: 06/17/2022: ALT 19; BUN 22; Creatinine, Ser 0.94; Potassium 4.3; Sodium 141  Recent Lipid Panel    Component Value Date/Time   CHOL 122 06/17/2022 1058   CHOL 120 07/11/2020 0835   TRIG 138.0 06/17/2022 1058   HDL 37.40 (L) 06/17/2022 1058   HDL 49 07/11/2020 0835   CHOLHDL 3 06/17/2022 1058   VLDL 27.6 06/17/2022 1058   LDLCALC 57 06/17/2022 1058   LDLCALC 56 07/11/2020 0835     Risk Assessment/Calculations:           Physical Exam:    Physical Exam: Blood pressure 132/75, pulse 81, height 6' (1.829 m), weight 251 lb 6.4 oz (114 kg), SpO2 95%.       GEN:  Well nourished, well developed in no acute distress HEENT: Normal NECK: No JVD; No carotid bruits LYMPHATICS: No lymphadenopathy CARDIAC: RRR  2/6 systolic murmur  RESPIRATORY:  Clear to auscultation without rales, wheezing or rhonchi  ABDOMEN: Soft, non-tender, non-distended MUSCULOSKELETAL:  No edema; No deformity  SKIN: Warm and dry NEUROLOGIC:  Alert and oriented  x 3   ASSESSMENT:    1. Essential hypertension, benign   2. Chest tightness   3. Nonrheumatic aortic valve stenosis   4. Chest pain, unspecified type     PLAN:      Aortic stenosis:  he has moderate AS.  Will follow him yearly.  Anticipate repeat echo in 1-2 years.  He is able to do his normal activities without significant dyspnea  2.  CAD :  he has mild - moderate nonobstructive CAD .   His last LDL in March, 2024 was 57.        Medication Adjustments/Labs and Tests Ordered: Current medicines are reviewed at length with the patient today.  Concerns regarding medicines are outlined above.  Orders Placed This Encounter  Procedures   EKG 12-Lead   Meds ordered this encounter  Medications   carvedilol (COREG) 6.25 MG tablet    Sig: Take 1 tablet (6.25 mg total) by mouth 2 (two) times daily.    Dispense:  180 tablet    Refill:  3    Patient Instructions  Medication Instructions:  Your physician recommends that you continue on your current medications as directed. Please refer to the Current Medication list given to you today.  *If you need a refill on your cardiac medications before your next appointment, please call your pharmacy*  Lab Work: NONE If you have labs (blood work) drawn today and your tests are completely normal, you will receive your results only by: MyChart Message (if you have MyChart) OR A paper copy in the mail If you have any lab test that is abnormal or we need to change your treatment, we will call you to review the results.  Testing/Procedures: NONE  Follow-Up: At [facility], you and your health needs are our priority.  As part of our continuing mission to provide you with exceptional heart care, we have created designated Provider Care Teams.  These Care Teams include your primary Cardiologist (physician) and Advanced Practice Providers (APPs -  Physician Assistants and Nurse Practitioners) who all work together to provide you with  the care you need, when you need it.  Your next appointment:   1 year(s)  Provider:   Kristeen Miss, MD        Signed, Kristeen Miss, MD  11/03/2022 4:12 PM    [facility]

## 2022-11-03 ENCOUNTER — Ambulatory Visit: Payer: Medicare HMO | Attending: Cardiovascular Disease | Admitting: Cardiovascular Disease

## 2022-11-03 ENCOUNTER — Encounter: Payer: Self-pay | Admitting: Cardiovascular Disease

## 2022-11-03 VITALS — BP 132/75 | HR 81 | Ht 72.0 in | Wt 251.4 lb

## 2022-11-03 DIAGNOSIS — R0789 Other chest pain: Secondary | ICD-10-CM | POA: Diagnosis not present

## 2022-11-03 DIAGNOSIS — R079 Chest pain, unspecified: Secondary | ICD-10-CM

## 2022-11-03 DIAGNOSIS — I10 Essential (primary) hypertension: Secondary | ICD-10-CM

## 2022-11-03 DIAGNOSIS — I350 Nonrheumatic aortic (valve) stenosis: Secondary | ICD-10-CM

## 2022-11-03 MED ORDER — CARVEDILOL 6.25 MG PO TABS: 6.2500 mg | ORAL_TABLET | Freq: Two times a day (BID) | ORAL | 3 refills | Status: DC

## 2022-11-03 NOTE — Patient Instructions (Signed)
Medication Instructions:  Your physician recommends that you continue on your current medications as directed. Please refer to the Current Medication list given to you today.  *If you need a refill on your cardiac medications before your next appointment, please call your pharmacy*   Lab Work: NONE If you have labs (blood work) drawn today and your tests are completely normal, you will receive your results only by: [place] (if you have MyChart) OR A paper copy in the mail If you have any lab test that is abnormal or we need to change your treatment, we will call you to review the results.   Testing/Procedures: NONE   Follow-Up: At [facility], you and your health needs are our priority.  As part of our continuing mission to provide you with exceptional heart care, we have created designated Provider Care Teams.  These Care Teams include your primary Cardiologist (physician) and Advanced Practice Providers (APPs -  Physician Assistants and Nurse Practitioners) who all work together to provide you with the care you need, when you need it.  Your next appointment:   1 year(s)  Provider:   Mertie Moores, MD

## 2022-11-06 ENCOUNTER — Encounter: Payer: Self-pay | Admitting: Family Medicine

## 2022-11-06 ENCOUNTER — Ambulatory Visit (INDEPENDENT_AMBULATORY_CARE_PROVIDER_SITE_OTHER): Payer: Medicare HMO | Admitting: Family Medicine

## 2022-11-06 VITALS — BP 128/60 | HR 74 | Temp 97.8°F | Ht 72.0 in | Wt 251.6 lb

## 2022-11-06 DIAGNOSIS — E785 Hyperlipidemia, unspecified: Secondary | ICD-10-CM | POA: Diagnosis not present

## 2022-11-06 DIAGNOSIS — E669 Obesity, unspecified: Secondary | ICD-10-CM

## 2022-11-06 DIAGNOSIS — I10 Essential (primary) hypertension: Secondary | ICD-10-CM | POA: Diagnosis not present

## 2022-11-06 DIAGNOSIS — R059 Cough, unspecified: Secondary | ICD-10-CM | POA: Diagnosis not present

## 2022-11-06 DIAGNOSIS — J22 Unspecified acute lower respiratory infection: Secondary | ICD-10-CM

## 2022-11-06 DIAGNOSIS — E1169 Type 2 diabetes mellitus with other specified complication: Secondary | ICD-10-CM | POA: Diagnosis not present

## 2022-11-06 LAB — COMPREHENSIVE METABOLIC PANEL
ALT: 19 U/L (ref 0–53)
AST: 16 U/L (ref 0–37)
Albumin: 4.5 g/dL (ref 3.5–5.2)
Alkaline Phosphatase: 44 U/L (ref 39–117)
BUN: 20 mg/dL (ref 6–23)
CO2: 28 mEq/L (ref 19–32)
Calcium: 9.9 mg/dL (ref 8.4–10.5)
Chloride: 102 mEq/L (ref 96–112)
Creatinine, Ser: 0.91 mg/dL (ref 0.40–1.50)
GFR: 82.07 mL/min (ref >60.00)
Glucose, Bld: 178 mg/dL — ABNORMAL HIGH (ref 70–99)
Potassium: 4.1 mEq/L (ref 3.5–5.1)
Sodium: 140 mEq/L (ref 135–145)
Total Bilirubin: 0.6 mg/dL (ref 0.2–1.2)
Total Protein: 7.3 g/dL (ref 6.0–8.3)

## 2022-11-06 LAB — MICROALBUMIN / CREATININE URINE RATIO
Creatinine,U: 112.7 mg/dL
Microalb Creat Ratio: 0.6 mg/g (ref 0.0–30.0)
Microalb, Ur: <0.7 mg/dL (ref 0.0–1.9)

## 2022-11-06 LAB — HEMOGLOBIN A1C: Hgb A1c MFr Bld: 6.8 % — ABNORMAL HIGH (ref 4.6–6.5)

## 2022-11-06 MED ORDER — AZITHROMYCIN 250 MG PO TABS: ORAL_TABLET | ORAL | 0 refills | Status: AC

## 2022-11-06 NOTE — Progress Notes (Signed)
Subjective:  Patient ID: Jeffery George, male    DOB: 21-Sep-1945  Age: 77 y.o. MRN: [id number]  CC:  Chief Complaint  Patient presents with   Medication Problem    Pt notes issues getting some prescriptions, has been working with the pharmacy to get everything straitened out    Nasal Congestion    Started a month ago with sore throat, slight cough, on and off congestion     HPI Jeffery George presents for   Nasal congestion, sore throat, cough Started 1 month ago, sore thraot, cough and some chest congestion. Has been about a month. Tried otc cold meds, improved some. Discolored phlegm. Persistent cough. Has improved a lot in past week. No fever/dyspnea. No recent nasal congestion. Minimal chest congestion.   Hypertension: With history of TIA, treated with amlodipine (7.5mg  total - some issue with pharmacy, but ok now), carvedilol, losartan, Plavix.  Aortic valve stenosis followed by cardiology, Dr.Nahser.  Home readings: none, no new bleeding or se's with meds.  BP Readings from Last 3 Encounters:  11/06/22 128/60  11/03/22 132/75  06/17/22 128/68   Lab Results  Component Value Date   CREATININE 0.94 06/17/2022   Hyperlipidemia: As above, history of TIA.  May 2023 coronary calcium score 760 with mild plaque of the LAD and circumflex, RCA, but none appeared flow-limiting.  EF 65 to 75% on echo in April 2023.  Annual monitoring of aortic valve.  Lipitor 10 mg daily for his hyperlipidemia without new myalgias/side effects.  Lab Results  Component Value Date   CHOL 122 06/17/2022   HDL 37.40 (L) 06/17/2022   LDLCALC 57 06/17/2022   TRIG 138.0 06/17/2022   CHOLHDL 3 06/17/2022   Lab Results  Component Value Date   ALT 19 06/17/2022   AST 17 06/17/2022   ALKPHOS 38 (L) 06/17/2022   BILITOT 0.5 06/17/2022    Diabetes: Complicated by obesity, history of hyperglycemia, microalbuminuria Metformin 1000 mg twice daily, some initially gastric side effects that had improved,  although he did have some abdominal discomfort off and on in the morning after the higher dose of metformin at his last visit as well as diarrhea few days per week.  Metformin was decreased to 500 mg twice daily and order Januvia.  [place] Pepper was cost prohibitive.  A1c was doing well at 6.7 in March.  He is on statin with Lipitor 10 mg daily, ARB with losartan 100 mg daily. Tolerating lower dose of metformin 500mg  BID, no abd pain or diarrhea.  Home readings fasting: around 100-140 Postprandial: 190 yesterday No symptomatic lows Microalbumin: 3.0 on April 2023, due today. Optho, foot exam, pneumovax:  Optho - in March. Dr. Shea Evans Shingrix at pharmacy.   Lab Results  Component Value Date   HGBA1C 6.7 (H) 06/17/2022   HGBA1C 7.1 (H) 03/18/2022   HGBA1C 7.9 (A) 12/10/2021   Lab Results  Component Value Date   MICROALBUR 3.0 (H) 07/25/2021   LDLCALC 57 06/17/2022   CREATININE 0.94 06/17/2022     History Patient Active Problem List   Diagnosis Date Noted   Hyperlipidemia 06/17/2022   Aortic valve stenosis 06/17/2022   History of COVID-19 05/08/2020   Cough 05/08/2020   Rotator cuff syndrome, left 11/15/2018   Chronic left shoulder pain 08/09/2018   Left retinal detachment 08/11/2017   Gout 02/27/2013   Essential hypertension, benign 02/27/2013   Type 2 diabetes mellitus with obesity (HCC) 02/27/2013   Past Medical History:  Diagnosis Date   Arthritis  Diabetes mellitus without complication (HCC)    Heart murmur    Hypertension    Past Surgical History:  Procedure Laterality Date   CATARACT EXTRACTION     FRACTURE SURGERY     Lt ankle   GAS/FLUID EXCHANGE Left 08/12/2017   Procedure: C3F8 GAS/FLUID EXCHANGE;  Surgeon: Rennis Chris, MD;  Location: [facility] OR;  Service: Ophthalmology;  Laterality: Left;   HERNIA REPAIR     IRIDOTOMY / IRIDECTOMY Bilateral    stents   VASECTOMY     VITRECTOMY 25 GAUGE WITH SCLERAL BUCKLE Left 08/12/2017   Procedure: VITRECTOMY 25 GAUGE WITH  SCLERAL BUCKLE WITH ENDOLASER;  Surgeon: Rennis Chris, MD;  Location: [facility] OR;  Service: Ophthalmology;  Laterality: Left;   No Known Allergies Prior to Admission medications   Medication Sig Start Date End Date Taking? Authorizing Provider  Accu-Chek Softclix Lancets lancets Test blood sugar once daily. Dx E11.9 12/10/21  Yes Shade Flood, MD  amLODipine (NORVASC) 2.5 MG tablet TAKE 1 TABLET EVERY DAY. ADD TO 5MG  TAB FOR TOTAL 7.5MG  DOSE 08/12/22  Yes Shade Flood, MD  amLODipine (NORVASC) 5 MG tablet TAKE 1 TABLET EVERY DAY 01/30/22  Yes Shade Flood, MD  atorvastatin (LIPITOR) 10 MG tablet TAKE 1 TABLET EVERY DAY 01/30/22  Yes Shade Flood, MD  blood glucose meter kit and supplies TrueMetrix, TruTrack, Accu-Chek Aviva Expert, Accu-Chek Aviva Plus, Accu-Chek Guide, Accu-Chek Nano, or Accu-Chek Smartview.   Use once per day. 07/31/17  Yes Shade Flood, MD  Blood Glucose Monitoring Suppl (BLOOD GLUCOSE MONITOR KIT) KIT Use to test blood sugar twice daily. 07/17/13  Yes Shade Flood, MD  carvedilol (COREG) 6.25 MG tablet Take 1 tablet (6.25 mg total) by mouth 2 (two) times daily. 11/03/22  Yes Nahser, Deloris Ping, MD  clopidogrel (PLAVIX) 75 MG tablet TAKE 1 TABLET (75 MG TOTAL) BY MOUTH DAILY. 08/28/22  Yes Shade Flood, MD  glucose blood (TRUE METRIX BLOOD GLUCOSE TEST) test strip 1 each by Other route 3 (three) times daily. Use as instructed 08/05/21  Yes Janeece Agee, NP  Lancets [facility] ULTRASOFT) lancets Use as instructed 06/05/13  Yes Shade Flood, MD  latanoprost (XALATAN) 0.005 % ophthalmic solution Place 1 drop into both eyes every evening. 07/23/17  Yes [provider]  losartan (COZAAR) 100 MG tablet TAKE 1 TABLET EVERY DAY 08/28/22  Yes Shade Flood, MD  metFORMIN (GLUCOPHAGE) 500 MG tablet Take 1 tablet (500 mg total) by mouth 2 (two) times daily with a meal. 06/17/22  Yes Shade Flood, MD  Omega-3 Fatty Acids (FISH OIL) 1000 MG CAPS Take  by mouth daily.    Yes [provider]  sitaGLIPtin (JANUVIA) 50 MG tablet Take 1 tablet (50 mg total) by mouth daily. 06/17/22  Yes Shade Flood, MD  TRUEplus Lancets 33G MISC 1 each by Does not apply route in the morning, at noon, and at bedtime. 08/05/21  Yes Janeece Agee, NP   Social History   Socioeconomic History   Marital status: Married    Spouse name: Not on file   Number of children: Not on file   Years of education: Not on file   Highest education level: Not on file  Occupational History   Occupation: Pharmacologist  Tobacco Use   Smoking status: Former   Smokeless tobacco: Never  Substance and Sexual Activity   Alcohol use: No    Alcohol/week: 0.0 standard drinks of alcohol   Drug use:  No   Sexual activity: Never  Other Topics Concern   Not on file  Social History Narrative   Married   Science writer at [employer]   Social Determinants of Health   Financial Resource Strain: Low Risk  (01/21/2022)   Overall Financial Resource Strain (CARDIA)    Difficulty of Paying Living Expenses: Not hard at all  Food Insecurity: No Food Insecurity (01/21/2022)   Hunger Vital Sign    Worried About Running Out of Food in the Last Year: Never true    Ran Out of Food in the Last Year: Never true  Transportation Needs: No Transportation Needs (01/21/2022)   PRAPARE - Administrator, Civil Service (Medical): No    Lack of Transportation (Non-Medical): No  Physical Activity: Insufficiently Active (01/21/2022)   Exercise Vital Sign    Days of Exercise per Week: 3 days    Minutes of Exercise per Session: 30 min  Stress: No Stress Concern Present (01/21/2022)   [employer] of Occupational Health - Occupational Stress Questionnaire    Feeling of Stress : Not at all  Social Connections: Moderately Isolated (01/21/2022)   Social Connection and Isolation Panel [NHANES]    Frequency of Communication with Friends and Family: More than three  times a week    Frequency of Social Gatherings with Friends and Family: More than three times a week    Attends Religious Services: Never    Database administrator or Organizations: No    Attends Banker Meetings: Never    Marital Status: Married  Catering manager Violence: Not At Risk (01/21/2022)   Humiliation, Afraid, Rape, and Kick questionnaire    Fear of Current or Ex-Partner: No    Emotionally Abused: No    Physically Abused: No    Sexually Abused: No    Review of Systems  Per HPI.  Objective:   Vitals:   11/06/22 0826  BP: 128/60  Pulse: 74  Temp: 97.8 F (36.6 C)  TempSrc: Temporal  SpO2: 96%  Weight: 251 lb 9.6 oz (114.1 kg)  Height: 6' (1.829 m)     Physical Exam Vitals reviewed.  Constitutional:      Appearance: He is well-developed.  HENT:     Head: Normocephalic and atraumatic.     Right Ear: Tympanic membrane, ear canal and external ear normal.     Left Ear: Tympanic membrane, ear canal and external ear normal.     Nose: No rhinorrhea.     Mouth/Throat:     Pharynx: No oropharyngeal exudate or posterior oropharyngeal erythema.  Eyes:     Conjunctiva/sclera: Conjunctivae normal.     Pupils: Pupils are equal, round, and reactive to light.  Neck:     Vascular: No carotid bruit or JVD.  Cardiovascular:     Rate and Rhythm: Normal rate and regular rhythm.     Heart sounds: Murmur heard.  Pulmonary:     Effort: Pulmonary effort is normal.     Breath sounds: Normal breath sounds. No wheezing, rhonchi or rales.  Abdominal:     Palpations: Abdomen is soft.     Tenderness: There is no abdominal tenderness.  Musculoskeletal:     Cervical back: Neck supple.     Right lower leg: No edema.     Left lower leg: No edema.  Lymphadenopathy:     Cervical: No cervical adenopathy.  Skin:    General: Skin is warm and dry.     Findings:  No rash.  Neurological:     Mental Status: He is alert and oriented to person, place, and time.  Psychiatric:         Mood and Affect: Mood normal.        Behavior: Behavior normal.     Assessment & Plan:  Jeffery George is a 77 y.o. male . Type 2 diabetes mellitus with obesity (HCC) - Plan: Comprehensive metabolic panel, Microalbumin / creatinine urine ratio, Hemoglobin A1c  -Variable home readings, A1c was controlled last visit but now on lower dose of metformin.  Tolerating better, option of extended release metformin if dosage change needed given prior gastrointestinal symptoms.  Adjustment of plan accordingly based on labs.  Essential hypertension, benign  -Stable, continue same regimen and follow-up with cardiology as planned  Hyperlipidemia, unspecified hyperlipidemia type  -Tolerating statin, continue same.  Cough, unspecified type LRTI (lower respiratory tract infection)  -New concern, could have been viral infection, did have discolored phlegm, persistent cough but that is improving in states that recently he has not had any discolored phlegm.  Lungs were clear on exam.  Possible bronchitis, likely viral and as improving no new meds for now.  If he does not have continued improvement, or any return of discolored phlegm recommended starting azithromycin with potential side effects and risks of antibiotics discussed.  No orders of the defined types were placed in this encounter.  Patient Instructions  Cough could have been a virus or chest cold/bronchitis. If not continuing to improve, or discolored phlegm can fill the printed antibiotic.  Return to the clinic or go to the nearest emergency room if any of your symptoms worsen or new symptoms occur.  No med changes for now.   Check with your pharmacy as you may be due for the second shingrix vaccine.   Take care!      Signed,   Meredith Staggers, MD [facility], [facility] 11/06/22 9:02 AM

## 2022-11-06 NOTE — Patient Instructions (Addendum)
Cough could have been a virus or chest cold/bronchitis. If not continuing to improve, or discolored phlegm can fill the printed antibiotic.  Return to the clinic or go to the nearest emergency room if any of your symptoms worsen or new symptoms occur.  No med changes for now.   Check with your pharmacy as you may be due for the second shingrix vaccine.   Take care!

## 2022-11-08 ENCOUNTER — Other Ambulatory Visit: Payer: Self-pay | Admitting: Family Medicine

## 2022-11-08 DIAGNOSIS — E1169 Type 2 diabetes mellitus with other specified complication: Secondary | ICD-10-CM

## 2022-12-17 ENCOUNTER — Ambulatory Visit: Payer: Medicare HMO | Admitting: Student

## 2022-12-31 DIAGNOSIS — H401131 Primary open-angle glaucoma, bilateral, mild stage: Secondary | ICD-10-CM | POA: Diagnosis not present

## 2023-01-30 ENCOUNTER — Other Ambulatory Visit: Payer: Self-pay | Admitting: Family Medicine

## 2023-01-30 DIAGNOSIS — I10 Essential (primary) hypertension: Secondary | ICD-10-CM

## 2023-02-03 ENCOUNTER — Telehealth: Payer: Self-pay | Admitting: Family Medicine

## 2023-02-03 DIAGNOSIS — E669 Obesity, unspecified: Secondary | ICD-10-CM

## 2023-02-03 MED ORDER — TRUE METRIX BLOOD GLUCOSE TEST VI STRP: 1.0000 | ORAL_STRIP | Freq: Three times a day (TID) | 5 refills | Status: DC

## 2023-02-03 NOTE — Telephone Encounter (Signed)
LM prescription has been sent.

## 2023-02-03 NOTE — Telephone Encounter (Signed)
Pt called back without listening to V/M, has been notified script has been sent to the pharmacy

## 2023-02-03 NOTE — Telephone Encounter (Signed)
Encourage patient to contact the pharmacy for refills or they can request refills through [facility]   WHAT PHARMACY WOULD THEY LIKE THIS SENT TO:  [facility] - [place], [place] - [address]   MEDICATION NAME & DOSE: Glucose Blood (Strip)True Metrix Blood Glucose Test   NOTES/COMMENTS FROM PATIENT:      Front office please notify patient: It takes 48-72 hours to process rx refill requests Ask patient to call pharmacy to ensure rx is ready before heading there.

## 2023-02-10 ENCOUNTER — Ambulatory Visit: Payer: Medicare HMO | Admitting: Family Medicine

## 2023-02-10 ENCOUNTER — Encounter: Payer: Self-pay | Admitting: Family Medicine

## 2023-02-10 VITALS — BP 136/74 | HR 65 | Temp 98.8°F | Ht 72.0 in | Wt 247.4 lb

## 2023-02-10 DIAGNOSIS — E785 Hyperlipidemia, unspecified: Secondary | ICD-10-CM | POA: Diagnosis not present

## 2023-02-10 DIAGNOSIS — E1169 Type 2 diabetes mellitus with other specified complication: Secondary | ICD-10-CM | POA: Diagnosis not present

## 2023-02-10 DIAGNOSIS — E669 Obesity, unspecified: Secondary | ICD-10-CM

## 2023-02-10 DIAGNOSIS — L299 Pruritus, unspecified: Secondary | ICD-10-CM

## 2023-02-10 DIAGNOSIS — I350 Nonrheumatic aortic (valve) stenosis: Secondary | ICD-10-CM | POA: Diagnosis not present

## 2023-02-10 DIAGNOSIS — Z8673 Personal history of transient ischemic attack (TIA), and cerebral infarction without residual deficits: Secondary | ICD-10-CM

## 2023-02-10 DIAGNOSIS — I10 Essential (primary) hypertension: Secondary | ICD-10-CM | POA: Diagnosis not present

## 2023-02-10 DIAGNOSIS — Z7984 Long term (current) use of oral hypoglycemic drugs: Secondary | ICD-10-CM

## 2023-02-10 DIAGNOSIS — R079 Chest pain, unspecified: Secondary | ICD-10-CM

## 2023-02-10 DIAGNOSIS — R0789 Other chest pain: Secondary | ICD-10-CM | POA: Diagnosis not present

## 2023-02-10 DIAGNOSIS — G459 Transient cerebral ischemic attack, unspecified: Secondary | ICD-10-CM

## 2023-02-10 DIAGNOSIS — H9313 Tinnitus, bilateral: Secondary | ICD-10-CM | POA: Diagnosis not present

## 2023-02-10 LAB — CBC
HCT: 42 % (ref 39.0–52.0)
Hemoglobin: 14 g/dL (ref 13.0–17.0)
MCHC: 33.2 g/dL (ref 30.0–36.0)
MCV: 98.4 fL (ref 78.0–100.0)
Platelets: 185 10*3/uL (ref 150.0–400.0)
RBC: 4.27 Mil/uL (ref 4.22–5.81)
RDW: 12.8 % (ref 11.5–15.5)
WBC: 4.7 10*3/uL (ref 4.0–10.5)

## 2023-02-10 LAB — COMPREHENSIVE METABOLIC PANEL
ALT: 21 U/L (ref 0–53)
AST: 18 U/L (ref 0–37)
Albumin: 4.6 g/dL (ref 3.5–5.2)
Alkaline Phosphatase: 41 U/L (ref 39–117)
BUN: 24 mg/dL — ABNORMAL HIGH (ref 6–23)
CO2: 29 meq/L (ref 19–32)
Calcium: 9.5 mg/dL (ref 8.4–10.5)
Chloride: 103 meq/L (ref 96–112)
Creatinine, Ser: 1.05 mg/dL (ref 0.40–1.50)
GFR: 68.99 mL/min (ref >60.00)
Glucose, Bld: 152 mg/dL — ABNORMAL HIGH (ref 70–99)
Potassium: 4.5 meq/L (ref 3.5–5.1)
Sodium: 139 meq/L (ref 135–145)
Total Bilirubin: 0.7 mg/dL (ref 0.2–1.2)
Total Protein: 7.5 g/dL (ref 6.0–8.3)

## 2023-02-10 LAB — LIPID PANEL
Cholesterol: 117 mg/dL (ref 0–200)
HDL: 37.3 mg/dL — ABNORMAL LOW (ref >39.00)
LDL Cholesterol: 51 mg/dL (ref 0–99)
NonHDL: 79.21
Total CHOL/HDL Ratio: 3
Triglycerides: 140 mg/dL (ref 0.0–149.0)
VLDL: 28 mg/dL (ref 0.0–40.0)

## 2023-02-10 LAB — HEMOGLOBIN A1C: Hgb A1c MFr Bld: 6.6 % — ABNORMAL HIGH (ref 4.6–6.5)

## 2023-02-10 MED ORDER — CLOPIDOGREL BISULFATE 75 MG PO TABS: 75.0000 mg | ORAL_TABLET | Freq: Every day | ORAL | 3 refills | Status: DC

## 2023-02-10 MED ORDER — AMLODIPINE BESYLATE 2.5 MG PO TABS: 2.5000 mg | ORAL_TABLET | Freq: Every day | ORAL | 3 refills | Status: DC

## 2023-02-10 MED ORDER — LOSARTAN POTASSIUM 100 MG PO TABS: 100.0000 mg | ORAL_TABLET | Freq: Every day | ORAL | 3 refills | Status: DC

## 2023-02-10 MED ORDER — AMLODIPINE BESYLATE 5 MG PO TABS: 5.0000 mg | ORAL_TABLET | Freq: Every day | ORAL | 3 refills | Status: DC

## 2023-02-10 MED ORDER — CARVEDILOL 6.25 MG PO TABS: 6.2500 mg | ORAL_TABLET | Freq: Two times a day (BID) | ORAL | 3 refills | Status: DC

## 2023-02-10 MED ORDER — METFORMIN HCL 500 MG PO TABS: 500.0000 mg | ORAL_TABLET | Freq: Two times a day (BID) | ORAL | 3 refills | Status: DC

## 2023-02-10 MED ORDER — ATORVASTATIN CALCIUM 10 MG PO TABS: 10.0000 mg | ORAL_TABLET | Freq: Every day | ORAL | 10 refills | Status: DC

## 2023-02-10 MED ORDER — NEOMYCIN-POLYMYXIN-HC 3.5-10000-1 OT SOLN: 3.0000 [drp] | Freq: Three times a day (TID) | OTIC | 0 refills | Status: AC | PRN

## 2023-02-10 NOTE — Progress Notes (Signed)
Subjective:  Patient ID: Jeffery George, male    DOB: 10/31/1945  Age: 77 y.o. MRN: [id number]  CC:  Chief Complaint  Patient presents with   Medical Management of Chronic Issues    Pt is doing well     HPI Jeffery George presents for follow up.   New concern: Tinnitus: Both sides, high pitch constant. past 2 months, itching, soreness at times. Some days worse than others. Ringing constant. No pulsatile tinnitus. No apparent change in hearing. No blood or d/c from canals. Does not use cotton tip swabs.  No swimming. No treatments.   Diabetes: Complicated by obesity, history of hyperglycemia and microalbuminuria.  Treated with metformin 1000 mg twice daily previously, complicated by gastrointestinal side effects.  Decreased to 500 mg twice daily, and tried ordering Januvia but that was cost prohibitive.  He is on statin with Lipitor 10 mg daily, ARB with losartan 100 mg daily. No side effects.  Home readings fasting: 130 Postprandial: 140 No symptomatic lows.  Microalbumin: normal ratio 11/06/22.  Optho, foot exam, pneumovax:  Optho - Dr. Shea Evans in September.  2nd shingrix at [facility] in 12/2022, and flu vaccine. Declines covid booster.    Lab Results  Component Value Date   HGBA1C 6.8 (H) 11/06/2022   HGBA1C 6.7 (H) 06/17/2022   HGBA1C 7.1 (H) 03/18/2022   Lab Results  Component Value Date   MICROALBUR <0.7 11/06/2022   LDLCALC 57 06/17/2022   CREATININE 0.91 11/06/2022   Hypertension: With history of TIA, treated with amlodipine 7.5 mg total dosing, as well as carvedilol, losartan and Plavix.  Aortic valve stenosis followed by Dr. Elease Hashimoto, cardiology. No new bleeding.  Home readings: none. No new med side effects.  BP Readings from Last 3 Encounters:  02/10/23 136/74  11/06/22 128/60  11/03/22 132/75   Lab Results  Component Value Date   CREATININE 0.91 11/06/2022   Hyperlipidemia: With TIA as above.  Elevated coronary calcium scoring in May 2023, without apparent  flow-limiting lesions.  EF 65 to 75% on echo in April 2023.  Cardiology visit noted from July 23, moderate AS, yearly follow-up with repeat echo in 1 to 2 years.  Lipitor 10 mg for hyperlipidemia with last LDL 57. Fasting today.  Lab Results  Component Value Date   CHOL 122 06/17/2022   HDL 37.40 (L) 06/17/2022   LDLCALC 57 06/17/2022   TRIG 138.0 06/17/2022   CHOLHDL 3 06/17/2022   Lab Results  Component Value Date   ALT 19 11/06/2022   AST 16 11/06/2022   ALKPHOS 44 11/06/2022   BILITOT 0.6 11/06/2022     History Patient Active Problem List   Diagnosis Date Noted   Hyperlipidemia 06/17/2022   Aortic valve stenosis 06/17/2022   History of COVID-19 05/08/2020   Cough 05/08/2020   Rotator cuff syndrome, left 11/15/2018   Chronic left shoulder pain 08/09/2018   Left retinal detachment 08/11/2017   Gout 02/27/2013   Essential hypertension, benign 02/27/2013   Type 2 diabetes mellitus with obesity (HCC) 02/27/2013   Past Medical History:  Diagnosis Date   Arthritis    Diabetes mellitus without complication (HCC)    Heart murmur    Hypertension    Past Surgical History:  Procedure Laterality Date   CATARACT EXTRACTION     FRACTURE SURGERY     Lt ankle   GAS/FLUID EXCHANGE Left 08/12/2017   Procedure: C3F8 GAS/FLUID EXCHANGE;  Surgeon: Rennis Chris, MD;  Location: [facility] OR;  Service: Ophthalmology;  Laterality: Left;   HERNIA REPAIR     IRIDOTOMY / IRIDECTOMY Bilateral    stents   VASECTOMY     VITRECTOMY 25 GAUGE WITH SCLERAL BUCKLE Left 08/12/2017   Procedure: VITRECTOMY 25 GAUGE WITH SCLERAL BUCKLE WITH ENDOLASER;  Surgeon: Rennis Chris, MD;  Location: [facility] OR;  Service: Ophthalmology;  Laterality: Left;   No Known Allergies Prior to Admission medications   Medication Sig Start Date End Date Taking? Authorizing Provider  Accu-Chek Softclix Lancets lancets Test blood sugar once daily. Dx E11.9 12/10/21   Shade Flood, MD  amLODipine (NORVASC) 2.5 MG tablet TAKE 1  TABLET EVERY DAY. ADD TO 5MG  TAB FOR TOTAL 7.5MG  DOSE 08/12/22   Shade Flood, MD  amLODipine (NORVASC) 5 MG tablet TAKE 1 TABLET EVERY DAY 02/01/23   Shade Flood, MD  atorvastatin (LIPITOR) 10 MG tablet TAKE 1 TABLET EVERY DAY 01/30/22   Shade Flood, MD  blood glucose meter kit and supplies TrueMetrix, TruTrack, Accu-Chek Aviva Expert, Accu-Chek Aviva Plus, Accu-Chek Guide, Accu-Chek Nano, or Accu-Chek Smartview.   Use once per day. 07/31/17   Shade Flood, MD  Blood Glucose Monitoring Suppl (BLOOD GLUCOSE MONITOR KIT) KIT Use to test blood sugar twice daily. 07/17/13   Shade Flood, MD  carvedilol (COREG) 6.25 MG tablet Take 1 tablet (6.25 mg total) by mouth 2 (two) times daily. 11/03/22   Nahser, Deloris Ping, MD  clopidogrel (PLAVIX) 75 MG tablet TAKE 1 TABLET (75 MG TOTAL) BY MOUTH DAILY. 08/28/22   Shade Flood, MD  glucose blood (TRUE METRIX BLOOD GLUCOSE TEST) test strip 1 each by Other route 3 (three) times daily. Use as instructed 02/03/23   Shade Flood, MD  Lancets [facility] ULTRASOFT) lancets Use as instructed 06/05/13   Shade Flood, MD  latanoprost (XALATAN) 0.005 % ophthalmic solution Place 1 drop into both eyes every evening. 07/23/17   [provider]  losartan (COZAAR) 100 MG tablet TAKE 1 TABLET EVERY DAY 08/28/22   Shade Flood, MD  metFORMIN (GLUCOPHAGE) 500 MG tablet TAKE 1 TABLET TWICE DAILY WITH MEALS 11/09/22   Shade Flood, MD  Omega-3 Fatty Acids (FISH OIL) 1000 MG CAPS Take by mouth daily.     [provider]  sitaGLIPtin (JANUVIA) 50 MG tablet Take 1 tablet (50 mg total) by mouth daily. 06/17/22   Shade Flood, MD  TRUEplus Lancets 33G MISC 1 each by Does not apply route in the morning, at noon, and at bedtime. 08/05/21   Janeece Agee, NP   Social History   Socioeconomic History   Marital status: Married    Spouse name: Not on file   Number of children: Not on file   Years of education: Not on file    Highest education level: Not on file  Occupational History   Occupation: Pharmacologist  Tobacco Use   Smoking status: Former   Smokeless tobacco: Never  Substance and Sexual Activity   Alcohol use: No    Alcohol/week: 0.0 standard drinks of alcohol   Drug use: No   Sexual activity: Never  Other Topics Concern   Not on file  Social History Narrative   Married   Science writer at [employer]   Social Determinants of Health   Financial Resource Strain: Low Risk  (01/21/2022)   Overall Financial Resource Strain (CARDIA)    Difficulty of Paying Living Expenses: Not hard at all  Food Insecurity: No Food Insecurity (01/21/2022)  Hunger Vital Sign    Worried About Running Out of Food in the Last Year: Never true    Ran Out of Food in the Last Year: Never true  Transportation Needs: No Transportation Needs (01/21/2022)   PRAPARE - Administrator, Civil Service (Medical): No    Lack of Transportation (Non-Medical): No  Physical Activity: Insufficiently Active (01/21/2022)   Exercise Vital Sign    Days of Exercise per Week: 3 days    Minutes of Exercise per Session: 30 min  Stress: No Stress Concern Present (01/21/2022)   [employer] of Occupational Health - Occupational Stress Questionnaire    Feeling of Stress : Not at all  Social Connections: Moderately Isolated (01/21/2022)   Social Connection and Isolation Panel [NHANES]    Frequency of Communication with Friends and Family: More than three times a week    Frequency of Social Gatherings with Friends and Family: More than three times a week    Attends Religious Services: Never    Database administrator or Organizations: No    Attends Banker Meetings: Never    Marital Status: Married  Catering manager Violence: Not At Risk (01/21/2022)   Humiliation, Afraid, Rape, and Kick questionnaire    Fear of Current or Ex-Partner: No    Emotionally Abused: No    Physically Abused: No     Sexually Abused: No    Review of Systems  Constitutional:  Negative for fatigue and unexpected weight change.  Eyes:  Negative for visual disturbance.  Respiratory:  Negative for cough, chest tightness and shortness of breath.   Cardiovascular:  Negative for chest pain, palpitations and leg swelling.  Gastrointestinal:  Negative for abdominal pain and blood in stool.  Neurological:  Negative for dizziness, speech difficulty, weakness, light-headedness and headaches.    Objective:   Vitals:   02/10/23 0936  BP: 136/74  Pulse: 65  Temp: 98.8 F (37.1 C)  TempSrc: Temporal  SpO2: 97%  Weight: 247 lb 6.4 oz (112.2 kg)  Height: 6' (1.829 m)     Physical Exam Vitals reviewed.  Constitutional:      Appearance: He is well-developed.  HENT:     Head: Normocephalic and atraumatic.     Ears:     Comments: Minimal cerumen in canals, nonobstructive.  Slight dry appearing canals, slight irritation within the canal but no edema, no significant erythema.  No pain with pinna motion.  Dull TMs bilaterally without apparent effusion or injection Neck:     Vascular: No carotid bruit or JVD.  Cardiovascular:     Rate and Rhythm: Normal rate and regular rhythm.     Heart sounds: Murmur (3-4/6 SEM) heard.  Pulmonary:     Effort: Pulmonary effort is normal.  Musculoskeletal:     Right lower leg: No edema.     Left lower leg: No edema.  Skin:    General: Skin is warm and dry.  Neurological:     Mental Status: He is alert and oriented to person, place, and time.  Psychiatric:        Mood and Affect: Mood normal.      Assessment & Plan:  Jeffery George is a 77 y.o. male . Ear itching - Plan: neomycin-polymyxin-hydrocortisone (CORTISPORIN) OTIC solution, Ambulatory referral to ENT Tinnitus of both ears - Plan: neomycin-polymyxin-hydrocortisone (CORTISPORIN) OTIC solution, Ambulatory referral to ENT  -New concern as above, past month.  No sign of infection, possible component of eczema  or dry  skin of canal.  Dull TM bilaterally.  Denies hearing changes but intermittent discomfort.  Refer to ENT, temporary use of Cortisporin otic if needed for itching.  RTC precautions.  Essential hypertension, benign - Plan: amLODipine (NORVASC) 2.5 MG tablet, amLODipine (NORVASC) 5 MG tablet, losartan (COZAAR) 100 MG tablet, Comprehensive metabolic panel, CBC  -Stable, continue same med regimen with monitoring labs as above  Hyperlipidemia, unspecified hyperlipidemia type - Plan: atorvastatin (LIPITOR) 10 MG tablet, Lipid panel `-Tolerating current dose statin, check labs and adjust plan accordingly   Chest tightness - Plan: carvedilol (COREG) 6.25 MG tablet  -Denies recent symptoms, continue carvedilol same dose along with other antihypertensives as above.  Nonrheumatic aortic valve stenosis - Plan: carvedilol (COREG) 6.25 MG tablet  -Cardiology following as above, asymptomatic.  Plan for repeat imaging in the next 1 to 2 years.  Chest pain, unspecified type - Plan: carvedilol (COREG) 6.25 MG tablet  -Prior history of chest pain, asymptomatic at present.  Continue same meds above.   TIA (transient ischemic attack) - Plan: clopidogrel (PLAVIX) 75 MG tablet  -Check updated LDL, continue managing diabetes, hypertension and hyperlipidemia with labs as above, adjust plan accordingly.  Type 2 diabetes mellitus with obesity (HCC) - Plan: metFORMIN (GLUCOPHAGE) 500 MG tablet, Hemoglobin A1c  -Tolerating metformin current dose, continue same, check A1c and adjust plan accordingly.  Meds ordered this encounter  Medications   neomycin-polymyxin-hydrocortisone (CORTISPORIN) OTIC solution    Sig: Place 3 drops into both ears 3 (three) times daily as needed for up to 3 days.    Dispense:  10 mL    Refill:  0   amLODipine (NORVASC) 2.5 MG tablet    Sig: Take 1 tablet (2.5 mg total) by mouth daily.    Dispense:  90 tablet    Refill:  3   amLODipine (NORVASC) 5 MG tablet    Sig: Take 1 tablet  (5 mg total) by mouth daily. Combine with 2.5mg  for 7.5mg  dose.    Dispense:  90 tablet    Refill:  3   atorvastatin (LIPITOR) 10 MG tablet    Sig: Take 1 tablet (10 mg total) by mouth daily.    Dispense:  90 tablet    Refill:  10   carvedilol (COREG) 6.25 MG tablet    Sig: Take 1 tablet (6.25 mg total) by mouth 2 (two) times daily.    Dispense:  180 tablet    Refill:  3   clopidogrel (PLAVIX) 75 MG tablet    Sig: Take 1 tablet (75 mg total) by mouth daily.    Dispense:  90 tablet    Refill:  3   losartan (COZAAR) 100 MG tablet    Sig: Take 1 tablet (100 mg total) by mouth daily.    Dispense:  90 tablet    Refill:  3   metFORMIN (GLUCOPHAGE) 500 MG tablet    Sig: Take 1 tablet (500 mg total) by mouth 2 (two) times daily with a meal.    Dispense:  180 tablet    Refill:  3   Patient Instructions  See information below on ringing in the ears.  I do not see an apparent infection in the ears, potentially could have some irritation in the canals that is causing the itching.  Cortisporin otic drops can be used if needed for itching, up to 3 times per day but you do not need to use those consistently if minimal symptoms.  I will refer you to ear nose and  throat specialist.  No other med changes at this time.  Let me know if there are questions.  59-month follow-up.  Take care.   Tinnitus Tinnitus is when you hear a sound that there's no actual source for. It may sound like ringing in your ears or something else. The sound may be loud, soft, or somewhere in between. It can last for a few seconds or be constant for days. It can come and go. Almost everyone has tinnitus at some point. It's not the same as hearing loss. But you may need to see a health care provider if: It lasts for a long time. It comes back often. You have trouble sleeping and focusing. What are the causes? The cause of tinnitus is often unknown. In some cases, you may get it if: You're around loud noises, such as from  machines or music. An object gets stuck in your ear. Earwax builds up in Landscape architect. You drink a lot of alcohol or caffeine. You take certain medicines. You start to lose your hearing. You may also get it from some medical conditions. These may include: Ear or sinus infections. Heart diseases. High blood pressure. Allergies. Mnire's disease. Problems with your thyroid. A tumor. This is a growth of cells that isn't normal. A weak, bulging blood vessel called an aneurysm near your ear. What increases the risk? You may be more likely to get tinnitus if: You're around loud noises a lot. You're older. You drink alcohol. You smoke. What are the signs or symptoms? The main symptom is hearing a sound that there's no source for. It may sound like ringing. It may also sound like: Buzzing. Sizzling. Blowing air. Hissing. Whistling. Other sounds may include: Roaring. Running water. A musical note. Tapping. Humming. You may have symptoms in one ear or both ears. How is this diagnosed? Tinnitus is diagnosed based on your symptoms, your medical history, and an exam. Your provider may do a full hearing test if your tinnitus: Is in just one ear. Makes it hard for you to hear. Lasts 6 months or longer. You may also need to see an expert in hearing disorders called an audiologist. They may ask you about your symptoms and how tinnitus affects your daily life. You may have other tests done. These may include: A CT scan. An MRI. An angiogram. This shows how blood flows through your blood vessels. How is this treated? Treatment may include: Therapy to help you manage the stress of living with tinnitus. Finding ways to mask or cover the sound of tinnitus. These include: Sound or white noise machines. Devices that fit in your ear and play sounds or music. A loud humidifier. Acoustic neural stimulation. This is when you use headphones to listen to music that has a special signal in it. Over  time, this signal may change some of the pathways in your brain. This can make you less sensitive to tinnitus. This treatment is used for very severe cases. Using hearing aids or cochlear implants if your tinnitus is from hearing loss. If your tinnitus is caused by a medical condition, treating the condition may make it go away.  Follow these instructions at home: Managing symptoms     Try to avoid being in loud places or around loud noises. Wear earplugs or headphones when you're around loud noises. Find ways to reduce stress. These may include meditation, yoga, or deep breathing. Sleep with your head slightly raised. General instructions Take over-the-counter and prescription medicines only as told  by your provider. Track the things that cause symptoms (triggers). Try to avoid these things. To stop your tinnitus from getting worse: Do not drink alcohol. Do not have caffeine. Do not use any products that contain nicotine or tobacco. These products include cigarettes, chewing tobacco, and vaping devices, such as e-cigarettes. If you need help quitting, ask your provider. Avoid using too much salt. Get enough sleep each night. Where to find more information American Tinnitus Association: https://www.johnson-hamilton.org/ Contact a health care provider if: Your symptoms last for 3 weeks or longer without stopping. You have sudden hearing loss. Your symptoms get worse or don't get better with home care. You can't manage the stress of living with tinnitus. Get help right away if: You get tinnitus after a head injury. You have tinnitus and: Dizziness. Nausea and vomiting. Loss of balance. A sudden, severe headache. Changes to your eyesight. Weakness in your face, arms, or legs. These symptoms may be an emergency. Get help right away. Call 911. Do not wait to see if the symptoms will go away. Do not drive yourself to the hospital. This information is not intended to replace advice given to you by your health  care provider. Make sure you discuss any questions you have with your health care provider. Document Revised: 07/06/2022 Document Reviewed: 07/06/2022 Elsevier Patient Education  2024 Elsevier Inc.        Signed,   Meredith Staggers, MD [facility], [facility] 02/10/23 10:19 AM

## 2023-02-10 NOTE — Patient Instructions (Addendum)
See information below on ringing in the ears.  I do not see an apparent infection in the ears, potentially could have some irritation in the canals that is causing the itching.  Cortisporin otic drops can be used if needed for itching, up to 3 times per day but you do not need to use those consistently if minimal symptoms.  I will refer you to ear nose and throat specialist.  No other med changes at this time.  Let me know if there are questions.  14-month follow-up.  Take care.   Tinnitus Tinnitus is when you hear a sound that there's no actual source for. It may sound like ringing in your ears or something else. The sound may be loud, soft, or somewhere in between. It can last for a few seconds or be constant for days. It can come and go. Almost everyone has tinnitus at some point. It's not the same as hearing loss. But you may need to see a health care provider if: It lasts for a long time. It comes back often. You have trouble sleeping and focusing. What are the causes? The cause of tinnitus is often unknown. In some cases, you may get it if: You're around loud noises, such as from machines or music. An object gets stuck in your ear. Earwax builds up in Landscape architect. You drink a lot of alcohol or caffeine. You take certain medicines. You start to lose your hearing. You may also get it from some medical conditions. These may include: Ear or sinus infections. Heart diseases. High blood pressure. Allergies. Mnire's disease. Problems with your thyroid. A tumor. This is a growth of cells that isn't normal. A weak, bulging blood vessel called an aneurysm near your ear. What increases the risk? You may be more likely to get tinnitus if: You're around loud noises a lot. You're older. You drink alcohol. You smoke. What are the signs or symptoms? The main symptom is hearing a sound that there's no source for. It may sound like ringing. It may also sound like: Buzzing. Sizzling. Blowing  air. Hissing. Whistling. Other sounds may include: Roaring. Running water. A musical note. Tapping. Humming. You may have symptoms in one ear or both ears. How is this diagnosed? Tinnitus is diagnosed based on your symptoms, your medical history, and an exam. Your provider may do a full hearing test if your tinnitus: Is in just one ear. Makes it hard for you to hear. Lasts 6 months or longer. You may also need to see an expert in hearing disorders called an audiologist. They may ask you about your symptoms and how tinnitus affects your daily life. You may have other tests done. These may include: A CT scan. An MRI. An angiogram. This shows how blood flows through your blood vessels. How is this treated? Treatment may include: Therapy to help you manage the stress of living with tinnitus. Finding ways to mask or cover the sound of tinnitus. These include: Sound or white noise machines. Devices that fit in your ear and play sounds or music. A loud humidifier. Acoustic neural stimulation. This is when you use headphones to listen to music that has a special signal in it. Over time, this signal may change some of the pathways in your brain. This can make you less sensitive to tinnitus. This treatment is used for very severe cases. Using hearing aids or cochlear implants if your tinnitus is from hearing loss. If your tinnitus is caused by a medical condition,  treating the condition may make it go away.  Follow these instructions at home: Managing symptoms     Try to avoid being in loud places or around loud noises. Wear earplugs or headphones when you're around loud noises. Find ways to reduce stress. These may include meditation, yoga, or deep breathing. Sleep with your head slightly raised. General instructions Take over-the-counter and prescription medicines only as told by your provider. Track the things that cause symptoms (triggers). Try to avoid these things. To stop your  tinnitus from getting worse: Do not drink alcohol. Do not have caffeine. Do not use any products that contain nicotine or tobacco. These products include cigarettes, chewing tobacco, and vaping devices, such as e-cigarettes. If you need help quitting, ask your provider. Avoid using too much salt. Get enough sleep each night. Where to find more information American Tinnitus Association: https://www.johnson-hamilton.org/ Contact a health care provider if: Your symptoms last for 3 weeks or longer without stopping. You have sudden hearing loss. Your symptoms get worse or don't get better with home care. You can't manage the stress of living with tinnitus. Get help right away if: You get tinnitus after a head injury. You have tinnitus and: Dizziness. Nausea and vomiting. Loss of balance. A sudden, severe headache. Changes to your eyesight. Weakness in your face, arms, or legs. These symptoms may be an emergency. Get help right away. Call 911. Do not wait to see if the symptoms will go away. Do not drive yourself to the hospital. This information is not intended to replace advice given to you by your health care provider. Make sure you discuss any questions you have with your health care provider. Document Revised: 07/06/2022 Document Reviewed: 07/06/2022 Elsevier Patient Education  2024 [employer].

## 2023-02-11 ENCOUNTER — Ambulatory Visit: Payer: Medicare HMO | Admitting: *Deleted

## 2023-02-11 DIAGNOSIS — Z0000 Encounter for general adult medical examination without abnormal findings: Secondary | ICD-10-CM | POA: Diagnosis not present

## 2023-02-11 NOTE — Patient Instructions (Signed)
[name] , Thank you for taking time to come for your Medicare Wellness Visit. I appreciate your ongoing commitment to your health goals. Please review the following plan we discussed and let me know if I can assist you in the future.   Screening recommendations/referrals: Colonoscopy: no longer required Recommended yearly ophthalmology/optometry visit for glaucoma screening and checkup Recommended yearly dental visit for hygiene and checkup  Vaccinations: Influenza vaccine: up to date Pneumococcal vaccine: up to date Tdap vaccine: up to date     Preventive Care 65 Years and Older, Male Preventive care refers to lifestyle choices and visits with your health care provider that can promote health and wellness. What does preventive care include? A yearly physical exam. This is also called an annual well check. Dental exams once or twice a year. Routine eye exams. Ask your health care provider how often you should have your eyes checked. Personal lifestyle choices, including: Daily care of your teeth and gums. Regular physical activity. Eating a healthy diet. Avoiding tobacco and drug use. Limiting alcohol use. Practicing safe sex. Taking low doses of aspirin every day. Taking vitamin and mineral supplements as recommended by your health care provider. What happens during an annual well check? The services and screenings done by your health care provider during your annual well check will depend on your age, overall health, lifestyle risk factors, and family history of disease. Counseling  Your health care provider may ask you questions about your: Alcohol use. Tobacco use. Drug use. Emotional well-being. Home and relationship well-being. Sexual activity. Eating habits. History of falls. Memory and ability to understand (cognition). Work and work Astronomer. Screening  You may have the following tests or measurements: Height, weight, and BMI. Blood pressure. Lipid and  cholesterol levels. These may be checked every 5 years, or more frequently if you are over 73 years old. Skin check. Lung cancer screening. You may have this screening every year starting at age 50 if you have a 30-pack-year history of smoking and currently smoke or have quit within the past 15 years. Fecal occult blood test (FOBT) of the stool. You may have this test every year starting at age 70. Flexible sigmoidoscopy or colonoscopy. You may have a sigmoidoscopy every 5 years or a colonoscopy every 10 years starting at age 90. Prostate cancer screening. Recommendations will vary depending on your family history and other risks. Hepatitis C blood test. Hepatitis B blood test. Sexually transmitted disease (STD) testing. Diabetes screening. This is done by checking your blood sugar (glucose) after you have not eaten for a while (fasting). You may have this done every 1-3 years. Abdominal aortic aneurysm (AAA) screening. You may need this if you are a current or former smoker. Osteoporosis. You may be screened starting at age 10 if you are at high risk. Talk with your health care provider about your test results, treatment options, and if necessary, the need for more tests. Vaccines  Your health care provider may recommend certain vaccines, such as: Influenza vaccine. This is recommended every year. Tetanus, diphtheria, and acellular pertussis (Tdap, Td) vaccine. You may need a Td booster every 10 years. Zoster vaccine. You may need this after age 23. Pneumococcal 13-valent conjugate (PCV13) vaccine. One dose is recommended after age 33. Pneumococcal polysaccharide (PPSV23) vaccine. One dose is recommended after age 58. Talk to your health care provider about which screenings and vaccines you need and how often you need them. This information is not intended to replace advice given to you  by your health care provider. Make sure you discuss any questions you have with your health care  provider. Document Released: 04/26/2015 Document Revised: 12/18/2015 Document Reviewed: 01/29/2015 Elsevier Interactive Patient Education  2017 [employer].  Fall Prevention in the Home Falls can cause injuries. They can happen to people of all ages. There are many things you can do to make your home safe and to help prevent falls. What can I do on the outside of my home? Regularly fix the edges of walkways and driveways and fix any cracks. Remove anything that might make you trip as you walk through a door, such as a raised step or threshold. Trim any bushes or trees on the path to your home. Use bright outdoor lighting. Clear any walking paths of anything that might make someone trip, such as rocks or tools. Regularly check to see if handrails are loose or broken. Make sure that both sides of any steps have handrails. Any raised decks and porches should have guardrails on the edges. Have any leaves, snow, or ice cleared regularly. Use sand or salt on walking paths during winter. Clean up any spills in your garage right away. This includes oil or grease spills. What can I do in the bathroom? Use night lights. Install grab bars by the toilet and in the tub and shower. Do not use towel bars as grab bars. Use non-skid mats or decals in the tub or shower. If you need to sit down in the shower, use a plastic, non-slip stool. Keep the floor dry. Clean up any water that spills on the floor as soon as it happens. Remove soap buildup in the tub or shower regularly. Attach bath mats securely with double-sided non-slip rug tape. Do not have throw rugs and other things on the floor that can make you trip. What can I do in the bedroom? Use night lights. Make sure that you have a light by your bed that is easy to reach. Do not use any sheets or blankets that are too big for your bed. They should not hang down onto the floor. Have a firm chair that has side arms. You can use this for support while  you get dressed. Do not have throw rugs and other things on the floor that can make you trip. What can I do in the kitchen? Clean up any spills right away. Avoid walking on wet floors. Keep items that you use a lot in easy-to-reach places. If you need to reach something above you, use a strong step stool that has a grab bar. Keep electrical cords out of the way. Do not use floor polish or wax that makes floors slippery. If you must use wax, use non-skid floor wax. Do not have throw rugs and other things on the floor that can make you trip. What can I do with my stairs? Do not leave any items on the stairs. Make sure that there are handrails on both sides of the stairs and use them. Fix handrails that are broken or loose. Make sure that handrails are as long as the stairways. Check any carpeting to make sure that it is firmly attached to the stairs. Fix any carpet that is loose or worn. Avoid having throw rugs at the top or bottom of the stairs. If you do have throw rugs, attach them to the floor with carpet tape. Make sure that you have a light switch at the top of the stairs and the bottom of the stairs. If  you do not have them, ask someone to add them for you. What else can I do to help prevent falls? Wear shoes that: Do not have high heels. Have rubber bottoms. Are comfortable and fit you well. Are closed at the toe. Do not wear sandals. If you use a stepladder: Make sure that it is fully opened. Do not climb a closed stepladder. Make sure that both sides of the stepladder are locked into place. Ask someone to hold it for you, if possible. Clearly mark and make sure that you can see: Any grab bars or handrails. First and last steps. Where the edge of each step is. Use tools that help you move around (mobility aids) if they are needed. These include: Canes. Walkers. Scooters. Crutches. Turn on the lights when you go into a dark area. Replace any light bulbs as soon as they burn  out. Set up your furniture so you have a clear path. Avoid moving your furniture around. If any of your floors are uneven, fix them. If there are any pets around you, be aware of where they are. Review your medicines with your doctor. Some medicines can make you feel dizzy. This can increase your chance of falling. Ask your doctor what other things that you can do to help prevent falls. This information is not intended to replace advice given to you by your health care provider. Make sure you discuss any questions you have with your health care provider. Document Released: 01/24/2009 Document Revised: 09/05/2015 Document Reviewed: 05/04/2014 Elsevier Interactive Patient Education  2017 [employer].

## 2023-02-11 NOTE — Progress Notes (Signed)
Subjective:   Jeffery George is a 77 y.o. male who presents for Medicare Annual/Subsequent preventive examination.  Visit Complete: Virtual I connected with  Sabra Heck on 02/11/23 by a audio enabled telemedicine application and verified that I am speaking with the correct person using two identifiers.  Patient Location: Home  Provider Location: Home Office  I discussed the limitations of evaluation and management by telemedicine. The patient expressed understanding and agreed to proceed.  Vital Signs: Because this visit was a virtual/telehealth visit, some criteria may be missing or patient reported. Any vitals not documented were not able to be obtained and vitals that have been documented are patient reported.  Patient Medicare AWV questionnaire was completed by the patient on  02-10-2023; I have confirmed that all information answered by patient is correct and no changes since this date.  Cardiac Risk Factors include: advanced age (>28men, >71 women);diabetes mellitus;male gender;family history of premature cardiovascular disease;hypertension;obesity (BMI >30kg/m2)     Objective:    There were no vitals filed for this visit. There is no height or weight on file to calculate BMI.     02/11/2023   11:07 AM 01/21/2022    1:00 PM 07/09/2021    3:47 PM 09/20/2020   12:42 PM 08/12/2017   10:37 AM 08/20/2016    2:37 PM 08/01/2015    7:37 AM  Advanced Directives  Does Patient Have a Medical Advance Directive? Yes No No No No No No  Type of Advance Directive Living will;Healthcare Power of Attorney        Copy of Healthcare Power of Attorney in Chart? No - copy requested      No - copy requested  Would patient like information on creating a medical advance directive?  No - Patient declined   No - Patient declined      Current Medications (verified) Outpatient Encounter Medications as of 02/11/2023  Medication Sig   Accu-Chek Softclix Lancets lancets Test blood sugar once daily.  Dx E11.9   amLODipine (NORVASC) 2.5 MG tablet Take 1 tablet (2.5 mg total) by mouth daily.   amLODipine (NORVASC) 5 MG tablet Take 1 tablet (5 mg total) by mouth daily. Combine with 2.5mg  for 7.5mg  dose.   atorvastatin (LIPITOR) 10 MG tablet Take 1 tablet (10 mg total) by mouth daily.   blood glucose meter kit and supplies TrueMetrix, TruTrack, Accu-Chek Aviva Expert, Accu-Chek Aviva Plus, Accu-Chek Guide, Accu-Chek Nano, or Accu-Chek Smartview.   Use once per day.   Blood Glucose Monitoring Suppl (BLOOD GLUCOSE MONITOR KIT) KIT Use to test blood sugar twice daily.   carvedilol (COREG) 6.25 MG tablet Take 1 tablet (6.25 mg total) by mouth 2 (two) times daily.   clopidogrel (PLAVIX) 75 MG tablet Take 1 tablet (75 mg total) by mouth daily.   glucose blood (TRUE METRIX BLOOD GLUCOSE TEST) test strip 1 each by Other route 3 (three) times daily. Use as instructed   Lancets (ONETOUCH ULTRASOFT) lancets Use as instructed   latanoprost (XALATAN) 0.005 % ophthalmic solution Place 1 drop into both eyes every evening.   losartan (COZAAR) 100 MG tablet Take 1 tablet (100 mg total) by mouth daily.   metFORMIN (GLUCOPHAGE) 500 MG tablet Take 1 tablet (500 mg total) by mouth 2 (two) times daily with a meal.   neomycin-polymyxin-hydrocortisone (CORTISPORIN) OTIC solution Place 3 drops into both ears 3 (three) times daily as needed for up to 3 days.   Omega-3 Fatty Acids (FISH OIL) 1000 MG CAPS Take  by mouth daily.    TRUEplus Lancets 33G MISC 1 each by Does not apply route in the morning, at noon, and at bedtime.   No facility-administered encounter medications on file as of 02/11/2023.    Allergies (verified) Patient has no known allergies.   History: Past Medical History:  Diagnosis Date   Arthritis    Diabetes mellitus without complication (HCC)    Heart murmur    Hypertension    Past Surgical History:  Procedure Laterality Date   CATARACT EXTRACTION     FRACTURE SURGERY     Lt ankle    GAS/FLUID EXCHANGE Left 08/12/2017   Procedure: C3F8 GAS/FLUID EXCHANGE;  Surgeon: Rennis Chris, MD;  Location: [facility] OR;  Service: Ophthalmology;  Laterality: Left;   HERNIA REPAIR     IRIDOTOMY / IRIDECTOMY Bilateral    stents   VASECTOMY     VITRECTOMY 25 GAUGE WITH SCLERAL BUCKLE Left 08/12/2017   Procedure: VITRECTOMY 25 GAUGE WITH SCLERAL BUCKLE WITH ENDOLASER;  Surgeon: Rennis Chris, MD;  Location: [facility] OR;  Service: Ophthalmology;  Laterality: Left;   Family History  Problem Relation Age of Onset   Cancer Mother    Diabetes Mother    Stroke Mother    Heart disease Father    Stroke Father    Diabetes Brother    Heart disease Brother    Diabetes Brother    Colon cancer Neg Hx    Social History   Socioeconomic History   Marital status: Married    Spouse name: Not on file   Number of children: Not on file   Years of education: Not on file   Highest education level: Not on file  Occupational History   Occupation: Pharmacologist  Tobacco Use   Smoking status: Former   Smokeless tobacco: Never  Substance and Sexual Activity   Alcohol use: No    Alcohol/week: 0.0 standard drinks of alcohol   Drug use: No   Sexual activity: Never  Other Topics Concern   Not on file  Social History Narrative   Married   Science writer at [employer]   Social Determinants of Health   Financial Resource Strain: Low Risk  (02/11/2023)   Overall Financial Resource Strain (CARDIA)    Difficulty of Paying Living Expenses: Not hard at all  Food Insecurity: No Food Insecurity (02/11/2023)   Hunger Vital Sign    Worried About Running Out of Food in the Last Year: Never true    Ran Out of Food in the Last Year: Never true  Transportation Needs: No Transportation Needs (02/11/2023)   PRAPARE - Administrator, Civil Service (Medical): No    Lack of Transportation (Non-Medical): No  Physical Activity: Inactive (02/11/2023)   Exercise Vital Sign    Days of Exercise per Week:  0 days    Minutes of Exercise per Session: 0 min  Stress: No Stress Concern Present (02/11/2023)   [employer] of Occupational Health - Occupational Stress Questionnaire    Feeling of Stress : Not at all  Social Connections: Socially Isolated (02/11/2023)   Social Connection and Isolation Panel [NHANES]    Frequency of Communication with Friends and Family: Once a week    Frequency of Social Gatherings with Friends and Family: Never    Attends Religious Services: Never    Database administrator or Organizations: No    Attends Banker Meetings: Never    Marital Status: Married  Tobacco Counseling Counseling given: Not Answered   Clinical Intake:  Pre-visit preparation completed: Yes  Pain : No/denies pain     Diabetes: Yes CBG done?: No Did pt. bring in CBG monitor from home?: No  How often do you need to have someone help you when you read instructions, pamphlets, or other written materials from your doctor or pharmacy?: 1 - Never  Interpreter Needed?: No  Information entered by :: Remi Haggard LPN   Activities of Daily Living    02/11/2023   11:10 AM  In your present state of health, do you have any difficulty performing the following activities:  Hearing? 1  Vision? 0  Difficulty concentrating or making decisions? 0  Walking or climbing stairs? 1  Comment sometimes  Dressing or bathing? 0  Doing errands, shopping? 0  Preparing Food and eating ? N  Using the Toilet? N  In the past six months, have you accidently leaked urine? N  Do you have problems with loss of bowel control? N  Managing your Medications? N  Managing your Finances? N  Housekeeping or managing your Housekeeping? N    Patient Care Team: Shade Flood, MD as PCP - General (Family Medicine) Lanier Prude, MD as PCP - Electrophysiology (Cardiology) Nahser, Deloris Ping, MD as PCP - Cardiology (Cardiology)  Indicate any recent Medical Services you may have received  from other than [facility] providers in the past year (date may be approximate).     Assessment:   This is a routine wellness examination for [facility].  Hearing/Vision screen Hearing Screening - Comments:: Some ringing in ears Referral in put Vision Screening - Comments:: Up to date [facility] Cataract surgery Retina surgery        Doing well now   Goals Addressed             This Visit's Progress    Patient Stated       Continue current lifestyle       Depression Screen    02/11/2023   11:12 AM 02/10/2023    9:30 AM 11/06/2022    8:24 AM 06/17/2022   10:08 AM 03/18/2022   10:28 AM 01/21/2022   12:59 PM 12/10/2021   10:18 AM  PHQ 2/9 Scores  PHQ - 2 Score 1 1 1 2  0 0 0  PHQ- 9 Score 3 3 6 4  0 0 0    Fall Risk    02/11/2023   11:06 AM 02/10/2023    9:30 AM 11/06/2022    8:24 AM 06/17/2022   10:08 AM 03/18/2022   10:28 AM  Fall Risk   Falls in the past year? 0 0 0 0 1  Number falls in past yr: 0 0 0 0 1  Injury with Fall? 0 0 0 0 1  Risk for fall due to :  No Fall Risks No Fall Risks No Fall Risks History of fall(s)  Follow up Falls evaluation completed;Education provided;Falls prevention discussed Falls evaluation completed Falls evaluation completed Falls evaluation completed Falls evaluation completed    MEDICARE RISK AT HOME: Medicare Risk at Home Any stairs in or around the home?: Yes If so, are there any without handrails?: No Home free of loose throw rugs in walkways, pet beds, electrical cords, etc?: Yes Adequate lighting in your home to reduce risk of falls?: Yes Life alert?: No Use of a cane, walker or w/c?: No Grab bars in the bathroom?: No Shower chair or bench in shower?: No Elevated toilet seat or  a handicapped toilet?: No  TIMED UP AND GO:  Was the test performed?  No    Cognitive Function:        02/11/2023   11:14 AM 01/21/2022    1:01 PM  6CIT Screen  What Year? 0 points 0 points  What month? 0 points 0 points  What time? 0 points 0 points   Count back from 20 0 points 0 points  Months in reverse 0 points 0 points  Repeat phrase 0 points 2 points  Total Score 0 points 2 points    Immunizations Immunization History  Administered Date(s) Administered   Fluad Quad(high Dose 65+) 12/20/2019, 03/18/2022, 12/30/2022   Influenza, High Dose Seasonal PF 01/18/2017, 12/10/2017   Influenza,inj,Quad PF,6+ Mos 02/27/2013, 06/11/2014, 12/10/2014, 02/03/2016   Influenza-Unspecified 01/18/2017, 01/11/2019   Pneumococcal Conjugate-13 06/12/2015   Pneumococcal Polysaccharide-23 05/01/2013   Tdap 05/01/2013   Zoster Recombinant(Shingrix) 04/02/2022   Zoster, Live 05/09/2013    TDAP status: Up to date  Flu Vaccine status: Up to date  Pneumococcal vaccine status: Up to date  Covid-19 vaccine status: Information provided on how to obtain vaccines.   Qualifies for Shingles Vaccine? Yes   Zostavax completed No   Shingrix Completed?: No.    Education has been provided regarding the importance of this vaccine. Patient has been advised to call insurance company to determine out of pocket expense if they have not yet received this vaccine. Advised may also receive vaccine at local pharmacy or Health Dept. Verbalized acceptance and understanding.  Screening Tests Health Maintenance  Topic Date Due   OPHTHALMOLOGY EXAM  06/27/2022   COVID-19 Vaccine (1 - 2023-24 season) 02/27/2023 (Originally 12/13/2022)   Zoster Vaccines- Shingrix (2 of 2) 05/14/2023 (Originally 05/28/2022)   DTaP/Tdap/Td (2 - Td or Tdap) 05/02/2023   HEMOGLOBIN A1C  08/11/2023   Diabetic kidney evaluation - Urine ACR  11/06/2023   FOOT EXAM  11/06/2023   Diabetic kidney evaluation - eGFR measurement  02/10/2024   Medicare Annual Wellness (AWV)  02/11/2024   Pneumonia Vaccine 7+ Years old  Completed   INFLUENZA VACCINE  Completed   Hepatitis C Screening  Completed   HPV VACCINES  Aged Out   Colonoscopy  Discontinued    Health Maintenance  Health Maintenance Due   Topic Date Due   OPHTHALMOLOGY EXAM  06/27/2022    Colorectal cancer screening: No longer required.   Lung Cancer Screening: (Low Dose CT Chest recommended if Age 9-80 years, 20 pack-year currently smoking OR have quit w/in 15years.) does not qualify.   Lung Cancer Screening Referral:   Additional Screening:  Hepatitis C Screening: does not qualify; Completed  2017  Vision Screening: Recommended annual ophthalmology exams for early detection of glaucoma and other disorders of the eye. Is the patient up to date with their annual eye exam?  Yes  Who is the provider or what is the name of the office in which the patient attends annual eye exams? [facility] If pt is not established with a provider, would they like to be referred to a provider to establish care? No .   Dental Screening: Recommended annual dental exams for proper oral hygiene Nutrition Risk Assessment:  Has the patient had any N/V/D within the last 2 months?  No  Does the patient have any non-healing wounds?  No  Has the patient had any unintentional weight loss or weight gain?  No   Diabetes:  Is the patient diabetic?  Yes  If diabetic, was a CBG  obtained today?  No  Did the patient bring in their glucometer from home?  Yes  How often do you monitor your CBG's? 1 x a day.   Financial Strains and Diabetes Management:  Are you having any financial strains with the device, your supplies or your medication? No .  Does the patient want to be seen by Chronic Care Management for management of their diabetes?  No  Would the patient like to be referred to a Nutritionist or for Diabetic Management?  No   Diabetic Exams:  Diabetic Eye Exam: Completed . Pt has been advised about the importance in completing this exam  Diabetic Foot Exam: . Pt has been advised about the importance in completing this exam..    Community Resource Referral / Chronic Care Management: CRR required this visit?  No   CCM required this visit?   No     Plan:     I have personally reviewed and noted the following in the patient's chart:   Medical and social history Use of alcohol, tobacco or illicit drugs  Current medications and supplements including opioid prescriptions. Patient is not currently taking opioid prescriptions. Functional ability and status Nutritional status Physical activity Advanced directives List of other physicians Hospitalizations, surgeries, and ER visits in previous 12 months Vitals Screenings to include cognitive, depression, and falls Referrals and appointments  In addition, I have reviewed and discussed with patient certain preventive protocols, quality metrics, and best practice recommendations. A written personalized care plan for preventive services as well as general preventive health recommendations were provided to patient.     Remi Haggard, LPN   [id number]   After Visit Summary: (MyChart) Due to this being a telephonic visit, the after visit summary with patients personalized plan was offered to patient via MyChart   Nurse Notes:

## 2023-02-12 ENCOUNTER — Encounter (INDEPENDENT_AMBULATORY_CARE_PROVIDER_SITE_OTHER): Payer: Self-pay | Admitting: Otolaryngology

## 2023-03-16 ENCOUNTER — Encounter (INDEPENDENT_AMBULATORY_CARE_PROVIDER_SITE_OTHER): Payer: Self-pay

## 2023-03-16 ENCOUNTER — Ambulatory Visit (INDEPENDENT_AMBULATORY_CARE_PROVIDER_SITE_OTHER): Payer: Medicare HMO | Admitting: Audiology

## 2023-03-16 ENCOUNTER — Ambulatory Visit (INDEPENDENT_AMBULATORY_CARE_PROVIDER_SITE_OTHER): Payer: Medicare HMO | Admitting: Otolaryngology

## 2023-03-16 VITALS — Ht 72.0 in | Wt 244.0 lb

## 2023-03-16 DIAGNOSIS — H903 Sensorineural hearing loss, bilateral: Secondary | ICD-10-CM | POA: Diagnosis not present

## 2023-03-16 DIAGNOSIS — H918X3 Other specified hearing loss, bilateral: Secondary | ICD-10-CM

## 2023-03-16 DIAGNOSIS — H9313 Tinnitus, bilateral: Secondary | ICD-10-CM

## 2023-03-16 DIAGNOSIS — H60543 Acute eczematoid otitis externa, bilateral: Secondary | ICD-10-CM

## 2023-03-16 MED ORDER — FLUOCINOLONE ACETONIDE 0.01 % OT OIL: 4.0000 [drp] | TOPICAL_OIL | Freq: Two times a day (BID) | OTIC | 0 refills | Status: AC

## 2023-03-16 NOTE — Progress Notes (Signed)
Dear Dr. Neva Seat, Here is my assessment for our mutual patient, Jeffery George. Thank you for allowing me the opportunity to care for your patient. Please do not hesitate to contact me should you have any other questions. Sincerely, Dr. Jovita Kussmaul  Otolaryngology Clinic Note Referring provider: Dr. Neva Seat HPI:  Baretta Zipkin is a 77 y.o. male kindly referred by Dr. Neva Seat for evaluation of bilateral tinnitus and hearing loss Initial visit (03/2023): Patient reports: bilateral tinnitus, high pitched, constant --- reports it has been for several years but getting worst. No autophony, ear fullness. Hard to go to sleep because gets louder when it is quiet.  Left side ear also itches, intermittently. Does not use q-tips. Within last couple of months. No antecedent event. 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 Occupation: retired. Lives in [place], [place].  PMHx: Diabetes, HTN, TIA, HLD, Aortic Valve Stenosis  Independent Review of Additional Tests or Records:  CT Angio 2022: cuts thick preventing comprehensive visualization of ear structures but mastoid well aerated, ossicles difficult to see completely; no obvious otic capsule path noted MRI Brain 09/14/2020: mastoids no significant effusion; sinuses generally clear; no retrocochlear lesion noted on w/o Ct  03/2023 Audiogram was independently reviewed and interpreted by me and it reveals A/A tymps. Normal  downsloping to mod-severe SNHL; asymmetry from 2-6KHz; WRT 96% 55dB AD; AS: 80% at 60 dB    SNHL= Sensorineural hearing loss   PMH/Meds/All/SocHx/FamHx/ROS:   Past Medical History:  Diagnosis Date   Arthritis    Diabetes mellitus without complication (HCC)    Heart murmur    Hypertension      Past Surgical History:  Procedure Laterality Date   CATARACT EXTRACTION     FRACTURE SURGERY     Lt ankle   GAS/FLUID EXCHANGE Left 08/12/2017   Procedure: C3F8 GAS/FLUID EXCHANGE;  Surgeon: Rennis Chris, MD;  Location: [facility] OR;  Service: Ophthalmology;  Laterality: Left;   HERNIA REPAIR     IRIDOTOMY / IRIDECTOMY Bilateral    stents   VASECTOMY     VITRECTOMY 25 GAUGE WITH SCLERAL BUCKLE Left 08/12/2017   Procedure: VITRECTOMY 25 GAUGE WITH SCLERAL BUCKLE WITH ENDOLASER;  Surgeon: Rennis Chris, MD;  Location: [facility] OR;  Service: Ophthalmology;  Laterality: Left;    Family History  Problem Relation Age of Onset   Cancer Mother    Diabetes Mother    Stroke Mother    Heart disease Father    Stroke Father    Diabetes Brother    Heart disease Brother    Diabetes Brother    Colon cancer Neg Hx      Social Connections: Socially Isolated (02/11/2023)   Social Connection and Isolation Panel [NHANES]    Frequency of Communication with Friends and Family: Once a week    Frequency of Social Gatherings with Friends and Family: Never    Attends Religious Services: Never    Database administrator or Organizations: No    Attends Banker Meetings: Never  Marital Status: Married      Current Outpatient Medications:    Accu-Chek Softclix Lancets lancets, Test blood sugar once daily. Dx E11.9, Disp: 100 each, Rfl: 2   amLODipine (NORVASC) 2.5 MG tablet, Take 1 tablet (2.5 mg total) by mouth daily., Disp: 90 tablet, Rfl: 3   amLODipine (NORVASC) 5 MG tablet, Take 1 tablet (5 mg total) by mouth daily. Combine with 2.5mg  for 7.5mg  dose., Disp: 90 tablet, Rfl: 3   atorvastatin  (LIPITOR) 10 MG tablet, Take 1 tablet (10 mg total) by mouth daily., Disp: 90 tablet, Rfl: 10   blood glucose meter kit and supplies, TrueMetrix, TruTrack, Accu-Chek Aviva Expert, Accu-Chek Aviva Plus, Accu-Chek Guide, Accu-Chek Nano, or Accu-Chek Smartview.   Use once per day., Disp: 1 each, Rfl: 0   Blood Glucose Monitoring Suppl (BLOOD GLUCOSE MONITOR KIT) KIT, Use to test blood sugar twice daily., Disp: 1 each, Rfl: 0   carvedilol (COREG) 6.25 MG tablet, Take 1 tablet (6.25 mg total) by mouth 2 (two) times daily., Disp: 180 tablet, Rfl: 3   clopidogrel (PLAVIX) 75 MG tablet, Take 1 tablet (75 mg total) by mouth daily., Disp: 90 tablet, Rfl: 3   Fluocinolone Acetonide 0.01 % OIL, Place 4 drops in ear(s) 2 (two) times daily for 14 days., Disp: 20 mL, Rfl: 0   glucose blood (TRUE METRIX BLOOD GLUCOSE TEST) test strip, 1 each by Other route 3 (three) times daily. Use as instructed, Disp: 300 each, Rfl: 5   Influenza vac split quadrivalent PF (FLUZONE HIGH-DOSE) 0.5 ML injection, Inject 0.5 mLs into the muscle once., Disp: , Rfl:    Lancets (ONETOUCH ULTRASOFT) lancets, Use as instructed, Disp: 100 each, Rfl: 12   latanoprost (XALATAN) 0.005 % ophthalmic solution, Place 1 drop into both eyes every evening., Disp: , Rfl:    losartan (COZAAR) 100 MG tablet, Take 1 tablet (100 mg total) by mouth daily., Disp: 90 tablet, Rfl: 3   metFORMIN (GLUCOPHAGE) 500 MG tablet, Take 1 tablet (500 mg total) by mouth 2 (two) times daily with a meal., Disp: 180 tablet, Rfl: 3   Omega-3 Fatty Acids (FISH OIL) 1000 MG CAPS, Take by mouth daily. , Disp: , Rfl:    TRUEplus Lancets 33G MISC, 1 each by Does not apply route in the morning, at noon, and at bedtime., Disp: 300 each, Rfl: 5   Physical Exam:   Ht 6' (1.829 m)   Wt 244 lb (110.7 kg)   BMI 33.09 kg/m   Salient findings:  CN II-XII intact  Bilateral EAC clear and TM intact with well pneumatized middle ear spaces; bilateral mild flakey skin in EAC Weber 512:  midline Rinne 512: AC > BC b/l  Anterior rhinoscopy: Septum relatively midline; bilateral inferior turbinates without significant hypertrophy No lesions of oral cavity/oropharynx No obviously palpable neck masses/lymphadenopathy/thyromegaly No respiratory distress or stridor  Seprately Identifiable Procedures:  None  Impression & Plans:  Becky Berberian is a 77 y.o. male with   1. Asymmetrical hearing loss   2. Bilateral tinnitus   3. Acute eczematoid otitis externa of both ears    Overall, audio is mostly reassuring except for asymmetry and significantly different WRT on left. Prior MRI did not show retrocochlear lesion but we discussed need for dedicated MRI IAC to rule out presence of one. He does have fair amount of noise exposure He also has some bilateral itching, which appears to be consistent with eczematoid OE. We discussed dry ear precautions and fluocinolone ointment trial (see  below)  MRI IAC  - f/u 8 weeks (after MRI)  See below regarding exact medications prescribed this encounter including dosages and route: Meds ordered this encounter  Medications   Fluocinolone Acetonide 0.01 % OIL    Sig: Place 4 drops in ear(s) 2 (two) times daily for 14 days.    Dispense:  20 mL    Refill:  0      Thank you for allowing me the opportunity to care for your patient. Please do not hesitate to contact me should you have any other questions.  Sincerely, Jovita Kussmaul, MD Otolarynoglogist (ENT), [facility] Phone: [phone] Fax: [phone]  03/27/2023, 7:10 PM   MDM:  Level 4 - 99204 Complexity/Problems addressed: mod - multiple chronic problems Data complexity: mod - independent review of CT/MRI - Morbidity: mod  - Prescription Drug prescribed or managed: yes

## 2023-03-16 NOTE — Progress Notes (Signed)
  [address] [place], [place] [address] [phone]  Audiological Evaluation    Name: Jeffery George     DOB:   Feb 11, 1946      MRN:   [id number]                                                                                     Service Date: 03/16/2023        Patient was referred today for a hearing evaluation by Dr. Allena Katz.   Symptoms Yes Details  Hearing loss  []  Patient denied perceiving any hearing loss.  Tinnitus  [x]  Patient reported experiencing bilateral tinnitus.  Previous ear surgeries  []  Patient denied any previous ear surgeries.  Family history  []  Patient denied family history of hearing loss.  Amplification  []  Patient denied the use of hearing aids.    Tympanogram: Right ear: Normal external ear canal volume with normal middle ear pressure and tympanic membrane compliance (Type A). Left ear: Normal external ear canal volume with normal middle ear pressure and tympanic membrane compliance (Type A).    Hearing Evaluation: The audiogram was completed using conventional audiometric techniques under headphones with good reliability.   The hearing test results indicate: Right ear: Normal hearing sensitivity from [phone] Hz sloping to moderately-severe sensorineural hearing loss from 4000-8000 Hz. Left ear: Normal hearing sensitivity from [phone] Hz sloping to moderately-severe sensorineural hearing loss from 2000-8000 Hz. Asymmetry noted from 2000-6000 Hz, where the left ear is worse than the right ear.  Speech Audiometry: Right ear- Speech Reception Threshold (SRT) was obtained at 15 dBHL. Left ear- Speech Reception Threshold (SRT) was obtained at 20 dBHL masked.   Word Recognition Score Tested using NU-6 (MLV) Right ear: 96% was obtained at a presentation level of 55 dBHL which is deemed as excellent understanding. Left ear: 80% was obtained at a presentation level of 60 dBHL with contralateral masking which is deemed as good understanding.     Recommendations: Repeat audiogram when changes are perceived or per MD. Consider various tinnitus strategies, including the use of a noise generator, hearing aids, or tinnitus retraining therapy.   Conley Rolls Aslin Farinas, AUD, CCC-A 03/16/23

## 2023-03-16 NOTE — Patient Instructions (Addendum)
I have ordered an imaging study for you to complete prior to your next visit. Please call [facility] Scheduling at [phone] to schedule your imaging if you have not received a call within 24 hours. If you are unable to complete your imaging study prior to your next scheduled visit please call our office to let us know.   Dermotic oil drops - 4 drops twice daily in left ear for 14 days  Try white noise or sound machine to help you sleep at night

## 2023-04-08 ENCOUNTER — Ambulatory Visit (HOSPITAL_COMMUNITY): Payer: Medicare HMO

## 2023-04-21 ENCOUNTER — Ambulatory Visit (HOSPITAL_COMMUNITY)
Admission: RE | Admit: 2023-04-21 | Discharge: 2023-04-21 | Disposition: A | Discharge status: Home / Self Care | Payer: Medicare HMO | Source: Ambulatory Visit | Attending: Otolaryngology | Admitting: Otolaryngology

## 2023-04-21 DIAGNOSIS — I6782 Cerebral ischemia: Secondary | ICD-10-CM | POA: Diagnosis not present

## 2023-04-21 DIAGNOSIS — H918X3 Other specified hearing loss, bilateral: Secondary | ICD-10-CM | POA: Insufficient documentation

## 2023-04-21 DIAGNOSIS — H9192 Unspecified hearing loss, left ear: Secondary | ICD-10-CM | POA: Diagnosis not present

## 2023-04-21 DIAGNOSIS — H9319 Tinnitus, unspecified ear: Secondary | ICD-10-CM | POA: Diagnosis not present

## 2023-04-21 DIAGNOSIS — R9089 Other abnormal findings on diagnostic imaging of central nervous system: Secondary | ICD-10-CM | POA: Diagnosis not present

## 2023-04-21 MED ORDER — GADOBUTROL 1 MMOL/ML IV SOLN
10.0000 mL | Freq: Once | INTRAVENOUS | Status: AC | PRN
  Administered 2023-04-21: 10 mL via INTRAVENOUS

## 2023-05-14 ENCOUNTER — Telehealth (INDEPENDENT_AMBULATORY_CARE_PROVIDER_SITE_OTHER): Payer: Self-pay | Admitting: Otolaryngology

## 2023-05-14 NOTE — Telephone Encounter (Signed)
LVM to confirm appt & location [phone] afm

## 2023-05-17 ENCOUNTER — Ambulatory Visit (INDEPENDENT_AMBULATORY_CARE_PROVIDER_SITE_OTHER): Payer: Medicare HMO | Admitting: Otolaryngology

## 2023-05-17 ENCOUNTER — Encounter (INDEPENDENT_AMBULATORY_CARE_PROVIDER_SITE_OTHER): Payer: Self-pay | Admitting: Otolaryngology

## 2023-05-17 VITALS — BP 154/72 | HR 75

## 2023-05-17 DIAGNOSIS — H9313 Tinnitus, bilateral: Secondary | ICD-10-CM

## 2023-05-17 DIAGNOSIS — H903 Sensorineural hearing loss, bilateral: Secondary | ICD-10-CM

## 2023-05-17 DIAGNOSIS — H918X3 Other specified hearing loss, bilateral: Secondary | ICD-10-CM

## 2023-05-17 DIAGNOSIS — H608X2 Other otitis externa, left ear: Secondary | ICD-10-CM

## 2023-05-17 NOTE — Progress Notes (Signed)
Dear Dr. Neva Seat, Here is my assessment for our mutual patient, Jeffery George. Thank you for allowing me the opportunity to care for your patient. Please do not hesitate to contact me should you have any other questions. Sincerely, Dr. Jovita Kussmaul  Otolaryngology Clinic Note Referring provider: Dr. Neva Seat HPI:  Jeffery George is a 78 y.o. male kindly referred by Dr. Neva Seat for evaluation of bilateral tinnitus and hearing loss Initial visit (03/2023): Patient reports: bilateral tinnitus, high pitched, constant --- reports it has been for several years but getting worst. No autophony, ear fullness. Hard to go to sleep because gets louder when it is quiet.  Left side ear also itches, intermittently. Does not use q-tips. Within last couple of months. No antecedent event. 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 He is doing overall well, continues to have tinnitus and hearing loss. Itching has completely resolved No drainage, balance still a little off.  We did go over the MRI results and his options extensively  --------------------------------------------------------------- H&N Surgery: no Personal or FHx of bleeding dz or anesthesia difficulty: no  GLP-1: no AP/AC: Plavix  Tobacco: quit 1983. Occupation: retired. Lives in [place], [place].  PMHx: Diabetes, HTN, TIA, HLD, Aortic Valve Stenosis  Independent Review of  Additional Tests or Records:  CT Angio 2022: cuts thick preventing comprehensive visualization of ear structures but mastoid well aerated, ossicles difficult to see completely; no obvious otic capsule path noted MRI Brain 09/14/2020: mastoids no significant effusion; sinuses generally clear; no retrocochlear lesion noted on w/o Ct  03/2023 Audiogram was independently reviewed and interpreted by me and it reveals A/A tymps. Normal downsloping to mod-severe SNHL; asymmetry from 2-6KHz; WRT 96% 55dB AD; AS: 80% at 60 dB    SNHL= Sensorineural hearing loss  MRI IAC 04/21/2023 reviewed and interpreted independently with respect to ears: no retrocochlear lesion, some microhemorrhages noted; no significant mastoid or ME effusion  PMH/Meds/All/SocHx/FamHx/ROS:   Past Medical History:  Diagnosis Date   Arthritis    Diabetes mellitus without complication (HCC)    Heart murmur    Hypertension      Past Surgical History:  Procedure Laterality Date   CATARACT EXTRACTION     FRACTURE SURGERY     Lt ankle   GAS/FLUID EXCHANGE Left 08/12/2017   Procedure: C3F8 GAS/FLUID EXCHANGE;  Surgeon: Rennis Chris, MD;  Location: [facility] OR;  Service: Ophthalmology;  Laterality: Left;   HERNIA REPAIR     IRIDOTOMY / IRIDECTOMY Bilateral    stents   VASECTOMY     VITRECTOMY 25 GAUGE WITH SCLERAL BUCKLE Left 08/12/2017   Procedure: VITRECTOMY 25 GAUGE WITH SCLERAL BUCKLE WITH ENDOLASER;  Surgeon: Rennis Chris, MD;  Location: [facility] OR;  Service: Ophthalmology;  Laterality: Left;    Family History  Problem Relation Age of Onset   Cancer Mother    Diabetes Mother    Stroke Mother    Heart disease Father    Stroke Father    Diabetes Brother    Heart disease Brother    Diabetes  Brother    Colon cancer Neg Hx      Social Connections: Socially Isolated (02/11/2023)   Social Connection and Isolation Panel [NHANES]    Frequency of Communication with Friends and Family: Once a week    Frequency of Social Gatherings  with Friends and Family: Never    Attends Religious Services: Never    Database administrator or Organizations: No    Attends Engineer, structural: Never    Marital Status: Married      Current Outpatient Medications:    Accu-Chek Softclix Lancets lancets, Test blood sugar once daily. Dx E11.9, Disp: 100 each, Rfl: 2   amLODipine (NORVASC) 2.5 MG tablet, Take 1 tablet (2.5 mg total) by mouth daily., Disp: 90 tablet, Rfl: 3   amLODipine (NORVASC) 5 MG tablet, Take 1 tablet (5 mg total) by mouth daily. Combine with 2.5mg  for 7.5mg  dose., Disp: 90 tablet, Rfl: 3   atorvastatin (LIPITOR) 10 MG tablet, Take 1 tablet (10 mg total) by mouth daily., Disp: 90 tablet, Rfl: 10   blood glucose meter kit and supplies, TrueMetrix, TruTrack, Accu-Chek Aviva Expert, Accu-Chek Aviva Plus, Accu-Chek Guide, Accu-Chek Nano, or Accu-Chek Smartview.   Use once per day., Disp: 1 each, Rfl: 0   Blood Glucose Monitoring Suppl (BLOOD GLUCOSE MONITOR KIT) KIT, Use to test blood sugar twice daily., Disp: 1 each, Rfl: 0   carvedilol (COREG) 6.25 MG tablet, Take 1 tablet (6.25 mg total) by mouth 2 (two) times daily., Disp: 180 tablet, Rfl: 3   clopidogrel (PLAVIX) 75 MG tablet, Take 1 tablet (75 mg total) by mouth daily., Disp: 90 tablet, Rfl: 3   glucose blood (TRUE METRIX BLOOD GLUCOSE TEST) test strip, 1 each by Other route 3 (three) times daily. Use as instructed, Disp: 300 each, Rfl: 5   Influenza vac split quadrivalent PF (FLUZONE HIGH-DOSE) 0.5 ML injection, Inject 0.5 mLs into the muscle once., Disp: , Rfl:    Lancets (ONETOUCH ULTRASOFT) lancets, Use as instructed, Disp: 100 each, Rfl: 12   latanoprost (XALATAN) 0.005 % ophthalmic solution, Place 1 drop into both eyes every evening., Disp: , Rfl:    losartan (COZAAR) 100 MG tablet, Take 1 tablet (100 mg total) by mouth daily., Disp: 90 tablet, Rfl: 3   metFORMIN (GLUCOPHAGE) 500 MG tablet, Take 1 tablet (500 mg total) by mouth 2 (two) times daily with a  meal., Disp: 180 tablet, Rfl: 3   Omega-3 Fatty Acids (FISH OIL) 1000 MG CAPS, Take by mouth daily. , Disp: , Rfl:    TRUEplus Lancets 33G MISC, 1 each by Does not apply route in the morning, at noon, and at bedtime., Disp: 300 each, Rfl: 5   Physical Exam:   BP (!) 154/72 (BP Location: Left Arm, Patient Position: Sitting, Cuff Size: Large)   Pulse 75   SpO2 95%   Salient findings:  CN II-XII intact  Bilateral EAC clear and TM intact with well pneumatized middle ear spaces; bilateral mild flakey skin in EAC which is much improved Weber 512: midline Rinne 512: AC > BC b/l  Anterior rhinoscopy: Septum relatively midline; bilateral inferior turbinates without significant hypertrophy No lesions of oral cavity/oropharynx No obviously palpable neck masses/lymphadenopathy/thyromegaly No respiratory distress or stridor  Seprately Identifiable Procedures:  None  Impression & Plans:  Jeffery George is a 78 y.o. male with   1. Asymmetrical hearing loss   2. Sensorineural hearing loss, bilateral   3. Bilateral tinnitus   4. Chronic eczematous otitis externa of  left ear    No retrocochlear lesions on MRI IAC. At this point, would recommend amplification (he'll check with insurance and the [place] - may be service connected with this). Will treat him PRN with fluocinolone for any eczematoid OE F/u in 1 year with repeat audio to ensure stability and recheck  See below regarding exact medications prescribed this encounter including dosages and route: No orders of the defined types were placed in this encounter.     Thank you for allowing me the opportunity to care for your patient. Please do not hesitate to contact me should you have any other questions.  Sincerely, Jovita Kussmaul, MD Otolaryngologist (ENT), [facility] Phone: [phone] Fax: [phone]  05/17/2023, 1:30 PM   MDM:  Level 4 - 99214 Complexity/Problems addressed: mod - multiple chronic problems Data  complexity: mod - independent review and interpretation of MRI - Morbidity: low  - Prescription Drug prescribed or managed: no

## 2023-06-22 ENCOUNTER — Telehealth (INDEPENDENT_AMBULATORY_CARE_PROVIDER_SITE_OTHER): Payer: Self-pay | Admitting: Otolaryngology

## 2023-06-22 NOTE — Telephone Encounter (Signed)
 Patient called and requested a copy of his Medical Records.  I printed out Office Visit Notes from [facility] 03/16/2023 & 05/17/2023 with Dr. Allena Katz and Mckinley Jewel and Notes from [facility] 03/16/2023 with Dr. Serafina Royals.  I called the patient and told him his records are ready for him to pick up and told him he would need to sign a release of information form before receiving the records.  The patient agreed and stated he would probably be by tomorrow to pick them up.

## 2023-07-01 DIAGNOSIS — E119 Type 2 diabetes mellitus without complications: Secondary | ICD-10-CM | POA: Diagnosis not present

## 2023-08-05 ENCOUNTER — Other Ambulatory Visit: Payer: Self-pay | Admitting: Family Medicine

## 2023-08-05 DIAGNOSIS — I10 Essential (primary) hypertension: Secondary | ICD-10-CM

## 2023-08-11 ENCOUNTER — Ambulatory Visit: Payer: Medicare HMO | Admitting: Family Medicine

## 2023-08-11 VITALS — BP 108/60 | HR 58 | Temp 98.0°F | Ht 72.0 in | Wt 244.6 lb

## 2023-08-11 DIAGNOSIS — H9190 Unspecified hearing loss, unspecified ear: Secondary | ICD-10-CM

## 2023-08-11 DIAGNOSIS — I10 Essential (primary) hypertension: Secondary | ICD-10-CM

## 2023-08-11 DIAGNOSIS — E669 Obesity, unspecified: Secondary | ICD-10-CM | POA: Diagnosis not present

## 2023-08-11 DIAGNOSIS — I350 Nonrheumatic aortic (valve) stenosis: Secondary | ICD-10-CM | POA: Diagnosis not present

## 2023-08-11 DIAGNOSIS — E1169 Type 2 diabetes mellitus with other specified complication: Secondary | ICD-10-CM

## 2023-08-11 DIAGNOSIS — E785 Hyperlipidemia, unspecified: Secondary | ICD-10-CM

## 2023-08-11 DIAGNOSIS — Z7984 Long term (current) use of oral hypoglycemic drugs: Secondary | ICD-10-CM | POA: Diagnosis not present

## 2023-08-11 DIAGNOSIS — H9313 Tinnitus, bilateral: Secondary | ICD-10-CM

## 2023-08-11 LAB — HEMOGLOBIN A1C: Hgb A1c MFr Bld: 6.9 % — ABNORMAL HIGH (ref 4.6–6.5)

## 2023-08-11 LAB — COMPREHENSIVE METABOLIC PANEL WITH GFR
ALT: 22 U/L (ref 0–53)
AST: 18 U/L (ref 0–37)
Albumin: 4.7 g/dL (ref 3.5–5.2)
Alkaline Phosphatase: 47 U/L (ref 39–117)
BUN: 23 mg/dL (ref 6–23)
CO2: 28 meq/L (ref 19–32)
Calcium: 10 mg/dL (ref 8.4–10.5)
Chloride: 101 meq/L (ref 96–112)
Creatinine, Ser: 1.02 mg/dL (ref 0.40–1.50)
GFR: 71.18 mL/min (ref >60.00)
Glucose, Bld: 163 mg/dL — ABNORMAL HIGH (ref 70–99)
Potassium: 4.5 meq/L (ref 3.5–5.1)
Sodium: 137 meq/L (ref 135–145)
Total Bilirubin: 0.9 mg/dL (ref 0.2–1.2)
Total Protein: 7.7 g/dL (ref 6.0–8.3)

## 2023-08-11 LAB — LIPID PANEL
Cholesterol: 129 mg/dL (ref 0–200)
HDL: 41.1 mg/dL (ref >39.00)
LDL Cholesterol: 60 mg/dL (ref 0–99)
NonHDL: 88.37
Total CHOL/HDL Ratio: 3
Triglycerides: 141 mg/dL (ref 0.0–149.0)
VLDL: 28.2 mg/dL (ref 0.0–40.0)

## 2023-08-11 LAB — CBC
HCT: 41.5 % (ref 39.0–52.0)
Hemoglobin: 14 g/dL (ref 13.0–17.0)
MCHC: 33.7 g/dL (ref 30.0–36.0)
MCV: 99 fl (ref 78.0–100.0)
Platelets: 216 10*3/uL (ref 150.0–400.0)
RBC: 4.19 Mil/uL — ABNORMAL LOW (ref 4.22–5.81)
RDW: 13.2 % (ref 11.5–15.5)
WBC: 5.7 10*3/uL (ref 4.0–10.5)

## 2023-08-11 NOTE — Progress Notes (Signed)
 Subjective:  Patient ID: Jeffery George, male    DOB: 1945-07-31  Age: 78 y.o. MRN: [id number]  CC:  Chief Complaint  Patient presents with   Medical Management of Chronic Issues    Pt is doing well, pt did see audiology and had hearing test and advised to get hearing aids and now trying to get assistance to pay for hearing aids     HPI Jeffery George presents for   Tinnitus, hearing loss Discussed in October, did see ENT audiology, hearing aids recommended, looking into assistance to cover cost. Working with [employer] to look into assistance for hearing through [place].  Diabetes: Complicated by obesity, prior history of hyperglycemia and microalbuminuria but has been well-controlled on most recent testing.  Metformin  gastrointestinal side effects in the past, but improved on 500 mg dosing twice daily.  Januvia  was cost prohibitive.  He is on statin with Lipitor 10 mg daily and ARB with losartan  100 mg daily without any side effects. Home readings fasting: 130-140 Postprandial: 110-120.  Only rare 200.  No symptomatic lows. No new side effects on lower dose metformin .  Fall in December, but has been doing well since that time, denies recent falls syncope or near syncope symptoms.  Microalbumin: Normal ratio 11/06/2022. Optho, foot exam, pneumovax: UTD - Dr. Alto Atta for eyes in past 2 months.  Had shingrix at pharmacy.  Covid booster - declines.  Tdap recommended at pharmacy.   Lab Results  Component Value Date   HGBA1C 6.6 (H) 02/10/2023   HGBA1C 6.8 (H) 11/06/2022   HGBA1C 6.7 (H) 06/17/2022   Lab Results  Component Value Date   MICROALBUR <0.7 11/06/2022   LDLCALC 51 02/10/2023   CREATININE 1.05 02/10/2023   Hypertension: With history of TIA treated with amlodipine  7.5 mg total dosing as well as carvedilol  losartan  and Plavix .  Followed by cardiology with aortic valve stenosis, moderate with 1 to 2-year echo follow-up.  Echo in 2023. Denies side effects with medications or new  bleeding. Prior cardiologist retired - Dr. Alroy Aspen.  Home readings: none BP Readings from Last 3 Encounters:  08/11/23 108/60  05/17/23 (!) 154/72  02/10/23 136/74   Lab Results  Component Value Date   CREATININE 1.05 02/10/2023    Hyperlipidemia: Treated with Lipitor 10 mg daily, stable LDL in October.  Prior TIA and elevated coronary calcium  scoring in 2023 without flow-limiting lesions.  Lab Results  Component Value Date   CHOL 117 02/10/2023   HDL 37.30 (L) 02/10/2023   LDLCALC 51 02/10/2023   TRIG 140.0 02/10/2023   CHOLHDL 3 02/10/2023   Lab Results  Component Value Date   ALT 21 02/10/2023   AST 18 02/10/2023   ALKPHOS 41 02/10/2023   BILITOT 0.7 02/10/2023      History Patient Active Problem List   Diagnosis Date Noted   Hyperlipidemia 06/17/2022   Aortic valve stenosis 06/17/2022   History of COVID-19 05/08/2020   Cough 05/08/2020   Rotator cuff syndrome, left 11/15/2018   Left retinal detachment 08/11/2017   Essential hypertension, benign 02/27/2013   Type 2 diabetes mellitus with obesity (HCC) 02/27/2013   Past Medical History:  Diagnosis Date   Arthritis    Diabetes mellitus without complication (HCC)    Heart murmur    Hypertension    Past Surgical History:  Procedure Laterality Date   CATARACT EXTRACTION     FRACTURE SURGERY     Lt ankle   GAS/FLUID EXCHANGE Left 08/12/2017   Procedure: C3F8  GAS/FLUID EXCHANGE;  Surgeon: Ronelle Coffee, MD;  Location: [facility] OR;  Service: Ophthalmology;  Laterality: Left;   HERNIA REPAIR     IRIDOTOMY / IRIDECTOMY Bilateral    stents   VASECTOMY     VITRECTOMY 25 GAUGE WITH SCLERAL BUCKLE Left 08/12/2017   Procedure: VITRECTOMY 25 GAUGE WITH SCLERAL BUCKLE WITH ENDOLASER;  Surgeon: Ronelle Coffee, MD;  Location: [facility] OR;  Service: Ophthalmology;  Laterality: Left;   No Known Allergies Prior to Admission medications   Medication Sig Start Date End Date Taking? Authorizing Provider  Accu-Chek Softclix Lancets  lancets Test blood sugar once daily. Dx E11.9 12/10/21  Yes Benjiman Bras, MD  amLODipine  (NORVASC ) 2.5 MG tablet TAKE 1 TABLET EVERY DAY. ADD TO 5MG  TABLET FOR TOTAL 7.5MG  DOSE. 08/05/23  Yes Benjiman Bras, MD  amLODipine  (NORVASC ) 5 MG tablet Take 1 tablet (5 mg total) by mouth daily. Combine with 2.5mg  for 7.5mg  dose. 02/10/23  Yes Benjiman Bras, MD  atorvastatin  (LIPITOR) 10 MG tablet Take 1 tablet (10 mg total) by mouth daily. 02/10/23  Yes Benjiman Bras, MD  blood glucose meter kit and supplies TrueMetrix, TruTrack, Accu-Chek Aviva Expert, Accu-Chek Aviva Plus, Accu-Chek Guide, Accu-Chek Nano, or Accu-Chek Smartview.   Use once per day. 07/31/17  Yes Benjiman Bras, MD  Blood Glucose Monitoring Suppl (BLOOD GLUCOSE MONITOR KIT) KIT Use to test blood sugar twice daily. 07/17/13  Yes Benjiman Bras, MD  carvedilol  (COREG ) 6.25 MG tablet Take 1 tablet (6.25 mg total) by mouth 2 (two) times daily. 02/10/23  Yes Benjiman Bras, MD  clopidogrel  (PLAVIX ) 75 MG tablet Take 1 tablet (75 mg total) by mouth daily. 02/10/23  Yes Benjiman Bras, MD  glucose blood (TRUE METRIX BLOOD GLUCOSE TEST) test strip 1 each by Other route 3 (three) times daily. Use as instructed 02/03/23  Yes Benjiman Bras, MD  Influenza vac split quadrivalent PF (FLUZONE HIGH-DOSE) 0.5 ML injection Inject 0.5 mLs into the muscle once.   Yes [provider]  Lancets Emmett Harman ULTRASOFT) lancets Use as instructed 06/05/13  Yes Benjiman Bras, MD  latanoprost (XALATAN) 0.005 % ophthalmic solution Place 1 drop into both eyes every evening. 07/23/17  Yes [provider]  losartan  (COZAAR ) 100 MG tablet Take 1 tablet (100 mg total) by mouth daily. 02/10/23  Yes Benjiman Bras, MD  metFORMIN  (GLUCOPHAGE ) 500 MG tablet Take 1 tablet (500 mg total) by mouth 2 (two) times daily with a meal. 02/10/23  Yes Benjiman Bras, MD  Omega-3 Fatty Acids (FISH OIL) 1000 MG CAPS Take by mouth daily.     Yes [provider]  TRUEplus Lancets 33G MISC 1 each by Does not apply route in the morning, at noon, and at bedtime. 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   Social Drivers of Health   Financial Resource Strain: Low Risk  (02/11/2023)   Overall Financial Resource Strain (CARDIA)    Difficulty of Paying Living Expenses: Not hard at all  Food Insecurity: No Food Insecurity (02/11/2023)   Hunger Vital Sign    Worried About Running Out of Food in the Last Year: Never true    Ran Out of Food in the Last Year: Never true  Transportation Needs: No Transportation Needs (02/11/2023)   PRAPARE - Administrator, Civil Service (Medical): No    Lack of Transportation (Non-Medical): No  Physical Activity: Inactive (02/11/2023)   Exercise Vital Sign    Days of Exercise per Week: 0 days    Minutes of Exercise per Session: 0 min  Stress: No Stress Concern Present (02/11/2023)   [employer] of Occupational Health - Occupational Stress Questionnaire    Feeling of Stress : Not at all  Social Connections: Socially Isolated (02/11/2023)   Social Connection and Isolation Panel [NHANES]    Frequency of Communication with Friends and Family: Once a week    Frequency of Social Gatherings with Friends and Family: Never    Attends Religious Services: Never    Database administrator or Organizations: No     Attends Banker Meetings: Never    Marital Status: Married  Catering manager Violence: Not At Risk (02/11/2023)   Humiliation, Afraid, Rape, and Kick questionnaire    Fear of Current or Ex-Partner: No    Emotionally Abused: No    Physically Abused: No    Sexually Abused: No    Review of Systems  Constitutional:  Negative for fatigue and unexpected weight change.  Eyes:  Negative for visual disturbance.  Respiratory:  Negative for cough, chest tightness and shortness of breath.   Cardiovascular:  Negative for chest pain, palpitations and leg swelling.  Gastrointestinal:  Negative for abdominal pain and blood in stool.  Neurological:  Negative for dizziness, light-headedness and headaches.     Objective:   Vitals:   08/11/23 1015  BP: 108/60  Pulse: (!) 58  Temp: 98 F (36.7 C)  TempSrc: Temporal  SpO2: 96%  Weight: 244 lb 9.6 oz (110.9 kg)  Height: 6' (1.829 m)     Physical Exam Vitals reviewed.  Constitutional:      Appearance: He is well-developed.  HENT:     Head: Normocephalic and atraumatic.  Neck:     Vascular: No carotid bruit or JVD.  Cardiovascular:     Rate and Rhythm: Normal rate and regular rhythm.     Heart sounds: Murmur (2 out of 6 systolic) heard.  Pulmonary:     Effort: Pulmonary effort is normal.     Breath sounds: Normal breath sounds. No rales.  Musculoskeletal:     Right lower leg: No edema.     Left lower leg: No edema.  Skin:    General: Skin is warm and dry.  Neurological:     Mental Status: He is alert and oriented to person, place, and time.  Psychiatric:        Mood and Affect: Mood normal.        Assessment & Plan:  Jeffery George is a 78 y.o. male . Tinnitus of both ears Hearing loss, unspecified hearing loss type, unspecified laterality  - [facility] and [facility].  Hyperlipidemia, unspecified hyperlipidemia type - Plan: Lipid panel, Comprehensive  metabolic panel with GFR  - Tolerating current regimen, check labs and adjust plan accordingly  Type 2 diabetes mellitus with obesity (HCC) - Plan: Lipid panel, Hemoglobin A1c  - Check A1c, labs as above and adjust plan accordingly, no med change for now as tolerated.  Essential hypertension, benign - Plan: CBC  - Stable, check labs, continue same regimen.  Reported history of fall few months back without residual symptoms or recurrence.  RTC/ER precautions if recurrent.  Nonrheumatic aortic valve stenosis - Plan: Ambulatory referral to Cardiology  - Will refer for ongoing follow-up, likely repeat imaging/echo.  Cardiology referral placed.  Asymptomatic.  No orders of the defined types were placed in this encounter.  Patient Instructions  Thank you for coming in today.  If any lightheadedness, dizziness or any return of falls be seen right away.  If any concerns on labs I will let you know.  No medication changes for now.  [facility] options but let me know if I can be of assistance.  I will refer you to a new cardiologist as repeat echocardiogram of your heart is likely due.  Take care!    Signed,   Caro Christmas, MD [facility] 08/11/23 11:14 AM

## 2023-08-11 NOTE — Patient Instructions (Signed)
 Thank you for coming in today.  If any lightheadedness, dizziness or any return of falls be seen right away.  If any concerns on labs I will let you know.  No medication changes for now.  [facility] options but let me know if I can be of assistance.  I will refer you to a new cardiologist as repeat echocardiogram of your heart is likely due.  Take care!

## 2023-08-12 ENCOUNTER — Encounter: Payer: Self-pay | Admitting: Family Medicine

## 2023-08-17 ENCOUNTER — Encounter: Payer: Self-pay | Admitting: Family Medicine

## 2023-08-31 ENCOUNTER — Encounter: Payer: Self-pay | Admitting: Family Medicine

## 2023-09-20 ENCOUNTER — Telehealth (INDEPENDENT_AMBULATORY_CARE_PROVIDER_SITE_OTHER): Payer: Self-pay | Admitting: Otolaryngology

## 2023-09-20 NOTE — Telephone Encounter (Signed)
 Patient called and wanted to know when they could expect the [employer] Letter to be completed and ready for pick up.  Please advise.

## 2023-09-23 ENCOUNTER — Encounter: Payer: Self-pay | Admitting: Cardiovascular Disease

## 2023-09-28 ENCOUNTER — Telehealth (INDEPENDENT_AMBULATORY_CARE_PROVIDER_SITE_OTHER): Payer: Self-pay | Admitting: Otolaryngology

## 2023-09-28 NOTE — Telephone Encounter (Signed)
 Called and spoke with patient to let them know Dr. Lydia Sams has written the [employer] Letter as requested and a copy is ready for pickup.  Patient has paid the $25.00 Paperwork Fee.  Patient stated he will pickup probably later today.

## 2023-11-04 NOTE — Progress Notes (Addendum)
 Cardiology Office Note   Date:  11/08/2023  ID:  Jeffery, George 01-Oct-1945, MRN [id number] PCP: Jeffery Reyes SAUNDERS, MD  [facility] Providers Cardiologist:  Jeffery Passe, MD (Inactive) Electrophysiologist:  Jeffery ONEIDA HOLTS, MD {    History of Present Illness Jeffery George is a 78 y.o. male with a past medical history of hypertension, diabetes mellitus, aortic stenosis here for follow-up appointment.  Echocardiogram revealed normal LVEF 65 to 70%.  Mild to moderate aortic stenosis with mean valve gradient of 19 mmHg.  Some shortness of breath since he had COVID.  Also chest burning.  Lots of fatigue.  Usually has to stop and rest doing mild exertion.  Has gained some weight about 30 pounds.  He did not receive any of the COVID-19 vaccines.  Has had COVID.  Tries to watch his salt intake.  He was last seen November 03, 2022.  Nonobstructive CAD was seen on coronary CTA with a CAC score of 760 which is 73 percentile for age/sex matched controls.  Borderline dilation of his ascending aorta at 39 mm.  Echocardiogram showed normal LV function.  Moderate AAS, mild AI.  Mean gradient 19 mmHg.  Today, he presents with coronary artery disease  with fatigue and vertigo. He is working with the [place] for his medical issues.  He experiences significant fatigue over the past few months with decreased exercise tolerance, making activities like walking a mile or yard work difficult. He denies chest pain and shortness of breath. A CT scan in 2023 showed coronary artery calcification, and an echocardiogram indicated normal heart pump function. He has aortic valve calcification and a heart murmur diagnosed in his teenage years.  He experiences frequent episodes of vertigo with a sensation of the room spinning and balance loss, leading to several falls over the past year. He fell in the bathroom two days before Christmas and was unable to get up for 13 hours. He is unsure about losing consciousness  during these episodes. The vertigo has worsened throughout the year, and he is not on medication for it.  His blood pressure has been variable, previously as high as 150/72, but more recently recorded at 108/60. He monitors his blood pressure at home and notes recent improvement.  Reports no shortness of breath nor dyspnea on exertion. Reports no chest pain, pressure, or tightness. No edema, orthopnea, PND. Reports no palpitations.   Discussed the use of AI scribe software for clinical note transcription with the patient, who gave verbal consent to proceed.  ROS: pertinent ROS in HPI  Studies Reviewed     Coronary CTA 08/29/2021  IMPRESSION: 1. Minimal mixed non-obstructive CAD, CADRADS = 2.   2. Coronary calcium  score of 760. This was 75th percentile for age and sex matched control.   3. Normal coronary origin with right dominance.   4. Aortic Valve: Trileaflet. Heavily calcified along the commissures. Aortic valve calcium  score is 1799. Recommend echo correlation for possible aortic stenosis.   5. Aorta: Borderline dilated at 39 mm at the mid ascending aorta (level of the PA bifurcation) measured double oblique. Aortic atherosclerosis. No dissection.   6. Dilated main pulmonary artery to 32 mm, suggestive of pulmonary hypertension.     Electronically Signed   By: Jeffery George M.D.   On: 08/29/2021 15:28  Physical Exam VS:  BP (!) 150/64 (BP Location: Left Arm, Patient Position: Standing, Cuff Size: Large)   Pulse 66   Resp 16   Ht 6' (1.829 m)  Wt 254 lb (115.2 kg)   SpO2 96%   BMI 34.45 kg/m        Wt Readings from Last 3 Encounters:  11/08/23 254 lb (115.2 kg)  08/11/23 244 lb 9.6 oz (110.9 kg)  03/16/23 244 lb (110.7 kg)    GEN: Well nourished, well developed in no acute distress NECK: No JVD; No carotid bruits CARDIAC: RRR, no murmurs, rubs, gallops RESPIRATORY:  Clear to auscultation without rales, wheezing or rhonchi  ABDOMEN: Soft, non-tender,  non-distended EXTREMITIES:  No edema; No deformity   ASSESSMENT AND PLAN   Coronary artery disease Coronary calcification noted on previous CT. Fatigue and decreased exercise tolerance suggest possible progression. Normal ejection fraction on last echocardiogram. - Order updated echocardiogram to assess ejection fraction and aortic valve. - Order updated CT scan to evaluate coronary arteries.  Aortic valve stenosis Aortic valve calcification present. Discussed TAVR as a less invasive option. Reassured about TAVR's benefits. - Order updated echocardiogram to assess aortic valve status, back in 2023 was classified as mild.   Hypertension Recent improvement in blood pressure. Advised to maintain around 130 to prevent dizziness. - Monitor blood pressure at home. - Report if blood pressure exceeds 150 systolic or drops below 110 systolic.  Vertigo Progressive vertigo with frequent falls. Discussed potential benefit of physical therapy for inner ear issues. - [facility] and potential [facility].  General Health Maintenance Cholesterol and A1c levels within acceptable range. Advised to monitor diet for cholesterol and blood sugar management.  ADDENDUM:  Spoke with the patient 8/11 regarding cardiac cath.   The patient understands that risks include but are not limited to stroke (1 in 1000), death (1 in 1000), kidney failure [usually temporary] (1 in 500), bleeding (1 in 200), allergic reaction [possibly serious] (1 in 200), and agrees to proceed.       Dispo: He can follow-up in 3-4 months with Dr. Floretta.   Signed, Jeffery LOISE Fabry, PA-C

## 2023-11-08 ENCOUNTER — Encounter: Payer: Self-pay | Admitting: Physician Assistant

## 2023-11-08 ENCOUNTER — Ambulatory Visit: Attending: Physician Assistant | Admitting: Physician Assistant

## 2023-11-08 VITALS — BP 150/64 | HR 66 | Resp 16 | Ht 72.0 in | Wt 254.0 lb

## 2023-11-08 DIAGNOSIS — I350 Nonrheumatic aortic (valve) stenosis: Secondary | ICD-10-CM | POA: Diagnosis not present

## 2023-11-08 DIAGNOSIS — R079 Chest pain, unspecified: Secondary | ICD-10-CM

## 2023-11-08 DIAGNOSIS — R0789 Other chest pain: Secondary | ICD-10-CM

## 2023-11-08 DIAGNOSIS — I2510 Atherosclerotic heart disease of native coronary artery without angina pectoris: Secondary | ICD-10-CM

## 2023-11-08 DIAGNOSIS — I10 Essential (primary) hypertension: Secondary | ICD-10-CM

## 2023-11-08 MED ORDER — METOPROLOL TARTRATE 100 MG PO TABS: 100.0000 mg | ORAL_TABLET | Freq: Once | ORAL | 0 refills | Status: DC

## 2023-11-08 NOTE — Patient Instructions (Addendum)
 Medication Instructions:  No medication changes were made at this visit. Continue current regimen.      Testing/Procedures: Your physician has requested that you have an echocardiogram. Echocardiography is a painless test that uses sound waves to create images of your heart. It provides your doctor with information about the size and shape of your heart and how well your heart's chambers and valves are working. This procedure takes approximately one hour. There are no restrictions for this procedure. Please do NOT wear cologne, perfume, aftershave, or lotions (deodorant is allowed). Please arrive 15 minutes prior to your appointment time.  Please note: We ask at that you not bring children with you during ultrasound (echo/ vascular) testing. Due to room size and safety concerns, children are not allowed in the ultrasound rooms during exams. Our front office staff cannot provide observation of children in our lobby area while testing is being conducted. An adult accompanying a patient to their appointment will only be allowed in the ultrasound room at the discretion of the ultrasound technician under special circumstances. We apologize for any inconvenience.    2.   Your cardiac CT will be scheduled at one of the below locations:   [facility] [address] [place], [place] [address] [phone]  OR   [facility] [address]  [place], [place] [address]  If scheduled at [facility], please arrive at the [facility] and Children's Entrance (Entrance C2) of [facility] 30 minutes prior to test start time. You can use the FREE valet parking offered at entrance C (encouraged to control the heart rate for the test)  Proceed to the [facility] Department (first floor) to check-in and test prep.  All radiology patients and guests should use entrance C2 at [facility], accessed from [facility], even though  the hospital's physical address listed is [address]  If scheduled at the Heart and Vascular Tower at [address], please enter the parking lot using the [address] entrance and use the FREE valet service at the patient drop-off area. Enter the buidling and check-in with registration on the main floor.  Please follow these instructions carefully (unless otherwise directed):  An IV will be required for this test and Nitroglycerin  will be given.  Hold all erectile dysfunction medications at least 3 days (72 hrs) prior to test. (Ie viagra, cialis, sildenafil, tadalafil, etc)   On the Night Before the Test: Be sure to Drink plenty of water . Do not consume any caffeinated/decaffeinated beverages or chocolate 12 hours prior to your test. Do not take any antihistamines 12 hours prior to your test.  On the Day of the Test: Drink plenty of water  until 1 hour prior to the test. Do not eat any food 1 hour prior to test. You may take your regular medications prior to the test.  Take metoprolol  (Lopressor ) 100 mg two hours prior to test. PLEASE HOLD your Carvedilol  the morning of the exam and take the metoprolol  100 mg instead. If you take Furosemide/Hydrochlorothiazide /Spironolactone/Chlorthalidone, please HOLD on the morning of the test. Patients who wear a continuous glucose monitor MUST remove the device prior to scanning.  After the Test: Drink plenty of water . After receiving IV contrast, you may experience a mild flushed feeling. This is normal. On occasion, you may experience a mild rash up to 24 hours after the test. This is not dangerous. If this occurs, you can take Benadryl 25 mg,  Zyrtec, Claritin, or Allegra and increase your fluid intake. (Patients taking Tikosyn should avoid Benadryl, and may take Zyrtec, Claritin, or Allegra) If you experience trouble breathing, this can be serious. If it is severe call 911 IMMEDIATELY. If it is mild, please call our office.  We  will call to schedule your test 2-4 weeks out understanding that some insurance companies will need an authorization prior to the service being performed.   For more information and frequently asked questions, please visit our website : [email or web address]  For non-scheduling related questions, please contact the cardiac imaging nurse navigator should you have any questions/concerns: Cardiac Imaging Nurse Navigators Direct Office Dial: [phone]   For scheduling needs, including cancellations and rescheduling, please call [place], [phone].     Follow-Up: At [facility], you and your health needs are our priority.  As part of our continuing mission to provide you with exceptional heart care, our providers are all part of one team.  This team includes your primary Cardiologist (physician) and Advanced Practice Providers or APPs (Physician Assistants and Nurse Practitioners) who all work together to provide you with the care you need, when you need it.  Your next appointment:   3 month(s)  Provider:   Georganna Archer, MD   We recommend signing up for the patient portal called MyChart.  Sign up information is provided on this After Visit Summary.  MyChart is used to connect with patients for Virtual Visits (Telemedicine).  Patients are able to view lab/test results, encounter notes, upcoming appointments, etc.  Non-urgent messages can be sent to your provider as well.   To learn more about what you can do with MyChart, go to [email or web address].

## 2023-11-10 ENCOUNTER — Telehealth (HOSPITAL_COMMUNITY): Payer: Self-pay | Admitting: *Deleted

## 2023-11-10 NOTE — Telephone Encounter (Signed)
 Patient returning call about his upcoming cardiac imaging study; pt verbalizes understanding of appt date/time, parking situation and where to check in, pre-test NPO status, and verified current allergies; name and call back number provided for further questions should they arise  Chantal Requena RN Navigator Cardiac Imaging [facility] [phone] office [phone] cell  Patient aware to obtain labs prior to his cardiac CT scan.

## 2023-11-10 NOTE — Telephone Encounter (Signed)
Attempted to call patient regarding upcoming cardiac CT appointment. Left message on voicemail with name and callback number  Erabella Kuipers RN Navigator Cardiac Imaging [facility] [phone] Office [phone] Cell  Reminder to obtain labs prior to appt. 

## 2023-11-11 DIAGNOSIS — R079 Chest pain, unspecified: Secondary | ICD-10-CM | POA: Diagnosis not present

## 2023-11-11 DIAGNOSIS — I2510 Atherosclerotic heart disease of native coronary artery without angina pectoris: Secondary | ICD-10-CM | POA: Diagnosis not present

## 2023-11-11 DIAGNOSIS — I350 Nonrheumatic aortic (valve) stenosis: Secondary | ICD-10-CM | POA: Diagnosis not present

## 2023-11-11 DIAGNOSIS — R0789 Other chest pain: Secondary | ICD-10-CM | POA: Diagnosis not present

## 2023-11-11 DIAGNOSIS — I10 Essential (primary) hypertension: Secondary | ICD-10-CM | POA: Diagnosis not present

## 2023-11-12 ENCOUNTER — Ambulatory Visit: Payer: Self-pay | Admitting: Physician Assistant

## 2023-11-12 LAB — BASIC METABOLIC PANEL WITH GFR
BUN/Creatinine Ratio: 21 (ref 10–24)
BUN: 19 mg/dL (ref 8–27)
CO2: 21 mmol/L (ref 20–29)
Calcium: 9.7 mg/dL (ref 8.6–10.2)
Chloride: 104 mmol/L (ref 96–106)
Creatinine, Ser: 0.89 mg/dL (ref 0.76–1.27)
Glucose: 181 mg/dL — ABNORMAL HIGH (ref 70–99)
Potassium: 4.4 mmol/L (ref 3.5–5.2)
Sodium: 142 mmol/L (ref 134–144)
eGFR: 88 mL/min/1.73 (ref >59)

## 2023-11-15 ENCOUNTER — Ambulatory Visit (HOSPITAL_COMMUNITY)
Admission: RE | Admit: 2023-11-15 | Discharge: 2023-11-15 | Disposition: A | Discharge status: Home / Self Care | Source: Ambulatory Visit | Attending: Cardiology | Admitting: Cardiology

## 2023-11-15 ENCOUNTER — Other Ambulatory Visit: Payer: Self-pay | Admitting: Cardiology

## 2023-11-15 DIAGNOSIS — I517 Cardiomegaly: Secondary | ICD-10-CM | POA: Diagnosis not present

## 2023-11-15 DIAGNOSIS — R931 Abnormal findings on diagnostic imaging of heart and coronary circulation: Secondary | ICD-10-CM

## 2023-11-15 DIAGNOSIS — I358 Other nonrheumatic aortic valve disorders: Secondary | ICD-10-CM | POA: Diagnosis not present

## 2023-11-15 DIAGNOSIS — I2510 Atherosclerotic heart disease of native coronary artery without angina pectoris: Secondary | ICD-10-CM | POA: Insufficient documentation

## 2023-11-15 DIAGNOSIS — R0600 Dyspnea, unspecified: Secondary | ICD-10-CM | POA: Insufficient documentation

## 2023-11-15 DIAGNOSIS — R0789 Other chest pain: Secondary | ICD-10-CM | POA: Insufficient documentation

## 2023-11-15 MED ORDER — IOHEXOL 350 MG/ML SOLN
100.0000 mL | Freq: Once | INTRAVENOUS | Status: AC | PRN
  Administered 2023-11-15: 100 mL via INTRAVENOUS

## 2023-11-15 MED ORDER — NITROGLYCERIN 0.4 MG SL SUBL
0.8000 mg | SUBLINGUAL_TABLET | Freq: Once | SUBLINGUAL | Status: AC
  Administered 2023-11-15: 0.8 mg via SUBLINGUAL

## 2023-11-17 ENCOUNTER — Ambulatory Visit: Payer: Self-pay | Admitting: Physician Assistant

## 2023-11-17 DIAGNOSIS — I2510 Atherosclerotic heart disease of native coronary artery without angina pectoris: Secondary | ICD-10-CM

## 2023-11-17 DIAGNOSIS — Z01818 Encounter for other preprocedural examination: Secondary | ICD-10-CM

## 2023-11-19 NOTE — Telephone Encounter (Signed)
 Spoke with patient and reviewed results and went over cath instructions. I have also sent Mychart per patient's request. Patient verbalized understanding and thanked me for the call   [facility] A DEPT OF [place] AT [address] A DEPT OF THE [place] [address] [place] [address] Dept: [phone] Loc: [phone]  Jeffery George  11/19/2023  You are scheduled for a Cardiac Catheterization on Thursday, August 14 with Dr. Newman Lawrence.  1. Please arrive at the [facility] (Main Entrance A) at [facility]: [address] [place], [place] [address] at 7:00 AM (This time is 2 hour(s) before your procedure to ensure your preparation).   Free valet parking service is available. You will check in at ADMITTING. The support person will be asked to wait in the waiting room.  It is OK to have someone drop you off and come back when you are ready to be discharged.    Special note: Every effort is made to have your procedure done on time. Please understand that emergencies sometimes delay scheduled procedures.  2. Diet: No solid foods after midnight. You may have clear liquids until you arrive at the hospital.  List of approved liquids water , clear juice, clear tea, black coffee, fruit juices, non-citric and without pulp, carbonated beverages, Gatorade, Kool -Aid, plain Jello-O and plain ice popsicles.   3. Hydration: You need to be well hydrated before your procedure time. You may drink approved liquids (see below) until you arrive at the hospital. On the way to the hospital, please drink a 16-oz (1 plastic bottle) of water .   List of approved liquids water , clear juice, clear tea, black coffee, fruit juices, non-citric and without pulp, carbonated beverages, Gatorade, Kool -Aid, plain Jello-O and plain ice popsicles.   4. Labs: You will need to have blood drawn on Monday, August 11 at [facility] (1st Floor), [address] [place], [place] [address]. You do not need to be fasting.  5. Medication instructions in preparation for your procedure:   Contrast Allergy: No  Do not take Diabetes Med Glucophage  (Metformin ) on the day of the procedure and HOLD 48 HOURS AFTER THE PROCEDURE.  On the morning of your procedure, take your Plavix /Clopidogrel  and any morning medicines NOT listed above.  You may use sips of water .  6. Plan to go home the same day, you will only stay overnight if medically necessary. 7. Bring a current list of your medications and current insurance cards. 8. You MUST have a responsible person to drive you home. 9. Someone MUST be with you the first 24 hours after you arrive home or your discharge will be delayed. 10. Please wear clothes that are easy to get on and off and wear slip-on shoes.  Thank you for allowing us  to care for you!   -- [facility]

## 2023-11-19 NOTE — Telephone Encounter (Signed)
 Patient returned staff call regarding results.

## 2023-11-22 ENCOUNTER — Encounter: Payer: Self-pay | Admitting: Physician Assistant

## 2023-11-22 ENCOUNTER — Other Ambulatory Visit: Payer: Self-pay

## 2023-11-22 DIAGNOSIS — Z01818 Encounter for other preprocedural examination: Secondary | ICD-10-CM

## 2023-11-22 DIAGNOSIS — I2510 Atherosclerotic heart disease of native coronary artery without angina pectoris: Secondary | ICD-10-CM | POA: Diagnosis not present

## 2023-11-23 ENCOUNTER — Telehealth: Payer: Self-pay | Admitting: *Deleted

## 2023-11-23 LAB — CBC
Hematocrit: 40.2 % (ref 37.5–51.0)
Hemoglobin: 13.3 g/dL (ref 13.0–17.7)
MCH: 33 pg (ref 26.6–33.0)
MCHC: 33.1 g/dL (ref 31.5–35.7)
MCV: 100 fL — ABNORMAL HIGH (ref 79–97)
Platelets: 200 x10E3/uL (ref 150–450)
RBC: 4.03 x10E6/uL — ABNORMAL LOW (ref 4.14–5.80)
RDW: 11.8 % (ref 11.6–15.4)
WBC: 4.2 x10E3/uL (ref 3.4–10.8)

## 2023-11-23 NOTE — Telephone Encounter (Signed)
 Cardiac Catheterization scheduled at [facility] for: Thursday November 25, 2023 9 AM Arrival time [facility] Main Entrance A at: 7 AM  Diet: -Nothing to eat after midnight prior to procedure.  Hydration: -May drink clear liquids until leaving for hospital. Approved liquids: Water , clear tea, black coffee, fruit juices-non-citric and without pulp,Gatorade, plain Jello/popsicles.  Drink 16 oz. bottle of water  on the way to the hospital.   Medication instructions: -Hold:  Metformin -day of procedure and 48 hours post procedure -Other usual morning medications can be taken including aspirin  81 mg and Plavix  75 mg.  Plan to go home the same day, you will only stay overnight if medically necessary.  You must have responsible adult to drive you home.  Someone must be with you the first 24 hours after you arrive home.  Reviewed procedure instructions with patient.

## 2023-11-25 ENCOUNTER — Ambulatory Visit (HOSPITAL_COMMUNITY)
Admission: RE | Admit: 2023-11-25 | Discharge: 2023-11-25 | Disposition: A | Discharge status: Home / Self Care | Attending: Cardiology | Admitting: Cardiology

## 2023-11-25 ENCOUNTER — Encounter (HOSPITAL_COMMUNITY)
Admission: RE | Disposition: A | Discharge status: Home / Self Care | Payer: Self-pay | Source: Home / Self Care | Attending: Cardiology

## 2023-11-25 ENCOUNTER — Other Ambulatory Visit: Payer: Self-pay

## 2023-11-25 DIAGNOSIS — Z28310 Unvaccinated for covid-19: Secondary | ICD-10-CM | POA: Diagnosis not present

## 2023-11-25 DIAGNOSIS — I10 Essential (primary) hypertension: Secondary | ICD-10-CM | POA: Insufficient documentation

## 2023-11-25 DIAGNOSIS — R42 Dizziness and giddiness: Secondary | ICD-10-CM | POA: Insufficient documentation

## 2023-11-25 DIAGNOSIS — Z8616 Personal history of COVID-19: Secondary | ICD-10-CM | POA: Diagnosis not present

## 2023-11-25 DIAGNOSIS — I350 Nonrheumatic aortic (valve) stenosis: Secondary | ICD-10-CM | POA: Insufficient documentation

## 2023-11-25 DIAGNOSIS — I2510 Atherosclerotic heart disease of native coronary artery without angina pectoris: Secondary | ICD-10-CM | POA: Diagnosis not present

## 2023-11-25 HISTORY — PX: LEFT HEART CATH AND CORONARY ANGIOGRAPHY: CATH118249

## 2023-11-25 HISTORY — PX: CORONARY PRESSURE/FFR STUDY: CATH118243

## 2023-11-25 LAB — POCT ACTIVATED CLOTTING TIME: Activated Clotting Time: 320 s

## 2023-11-25 LAB — GLUCOSE, CAPILLARY
Glucose-Capillary: 182 mg/dL — ABNORMAL HIGH (ref 70–99)
Glucose-Capillary: 142 mg/dL — ABNORMAL HIGH (ref 70–99)

## 2023-11-25 SURGERY — LEFT HEART CATH AND CORONARY ANGIOGRAPHY
Anesthesia: LOCAL

## 2023-11-25 MED ORDER — FENTANYL CITRATE (PF) 100 MCG/2ML IJ SOLN
INTRAMUSCULAR | Status: AC
Start: 2023-11-25 — End: 2023-11-25
  Filled 2023-11-25: qty 2

## 2023-11-25 MED ORDER — HEPARIN (PORCINE) IN NACL 2-0.9 UNITS/ML
INTRAMUSCULAR | Status: DC | PRN
  Administered 2023-11-25: 10 mL via INTRA_ARTERIAL

## 2023-11-25 MED ORDER — SODIUM CHLORIDE 0.9% FLUSH: 3.0000 mL | Freq: Two times a day (BID) | INTRAVENOUS | Status: DC

## 2023-11-25 MED ORDER — HEPARIN SODIUM (PORCINE) 1000 UNIT/ML IJ SOLN
INTRAMUSCULAR | Status: DC | PRN
  Administered 2023-11-25: 6000 [IU] via INTRAVENOUS
  Administered 2023-11-25: 5500 [IU] via INTRAVENOUS

## 2023-11-25 MED ORDER — SODIUM CHLORIDE 0.9 % IV SOLN
250.0000 mL | INTRAVENOUS | Status: DC | PRN
Start: 2023-11-25 — End: 2023-11-25

## 2023-11-25 MED ORDER — VERAPAMIL HCL 2.5 MG/ML IV SOLN
INTRAVENOUS | Status: AC
  Filled 2023-11-25: qty 2

## 2023-11-25 MED ORDER — LABETALOL HCL 5 MG/ML IV SOLN: 10.0000 mg | INTRAVENOUS | Status: DC | PRN

## 2023-11-25 MED ORDER — LIDOCAINE HCL (PF) 1 % IJ SOLN
INTRAMUSCULAR | Status: AC
  Filled 2023-11-25: qty 30

## 2023-11-25 MED ORDER — ACETAMINOPHEN 325 MG PO TABS
650.0000 mg | ORAL_TABLET | ORAL | Status: DC | PRN
Start: 2023-11-25 — End: 2023-11-25

## 2023-11-25 MED ORDER — ONDANSETRON HCL 4 MG/2ML IJ SOLN: 4.0000 mg | Freq: Four times a day (QID) | INTRAMUSCULAR | Status: DC | PRN

## 2023-11-25 MED ORDER — FREE WATER: 500.0000 mL | Freq: Once | Status: DC

## 2023-11-25 MED ORDER — LIDOCAINE HCL (PF) 1 % IJ SOLN
INTRAMUSCULAR | Status: DC | PRN
  Administered 2023-11-25: 2 mL

## 2023-11-25 MED ORDER — ASPIRIN 81 MG PO CHEW: 81.0000 mg | CHEWABLE_TABLET | ORAL | Status: DC

## 2023-11-25 MED ORDER — HEPARIN (PORCINE) IN NACL 1000-0.9 UT/500ML-% IV SOLN
INTRAVENOUS | Status: DC | PRN
  Administered 2023-11-25: 1000 mL via SURGICAL_CAVITY

## 2023-11-25 MED ORDER — NITROGLYCERIN 1 MG/10 ML FOR IR/CATH LAB
INTRA_ARTERIAL | Status: DC | PRN
  Administered 2023-11-25: 200 ug via INTRACORONARY

## 2023-11-25 MED ORDER — NITROGLYCERIN 1 MG/10 ML FOR IR/CATH LAB
INTRA_ARTERIAL | Status: AC
  Filled 2023-11-25: qty 10

## 2023-11-25 MED ORDER — IOHEXOL 350 MG/ML SOLN
INTRAVENOUS | Status: DC | PRN
  Administered 2023-11-25: 175 mL via INTRA_ARTERIAL

## 2023-11-25 MED ORDER — CLOPIDOGREL BISULFATE 75 MG PO TABS: 75.0000 mg | ORAL_TABLET | ORAL | Status: DC

## 2023-11-25 MED ORDER — SODIUM CHLORIDE 0.9% FLUSH
3.0000 mL | INTRAVENOUS | Status: DC | PRN
Start: 2023-11-25 — End: 2023-11-25

## 2023-11-25 MED ORDER — HEPARIN SODIUM (PORCINE) 1000 UNIT/ML IJ SOLN
INTRAMUSCULAR | Status: AC
  Filled 2023-11-25: qty 10

## 2023-11-25 MED ORDER — SODIUM CHLORIDE 0.9 % IV SOLN: 250.0000 mL | INTRAVENOUS | Status: DC | PRN

## 2023-11-25 MED ORDER — MIDAZOLAM HCL 2 MG/2ML IJ SOLN
INTRAMUSCULAR | Status: AC
  Filled 2023-11-25: qty 2

## 2023-11-25 MED ORDER — VERAPAMIL HCL 2.5 MG/ML IV SOLN
INTRAVENOUS | Status: DC | PRN
  Administered 2023-11-25: 10 mL via INTRA_ARTERIAL

## 2023-11-25 MED ORDER — FENTANYL CITRATE (PF) 100 MCG/2ML IJ SOLN
INTRAMUSCULAR | Status: DC | PRN
  Administered 2023-11-25: 25 ug via INTRAVENOUS

## 2023-11-25 MED ORDER — MIDAZOLAM HCL 2 MG/2ML IJ SOLN
INTRAMUSCULAR | Status: DC | PRN
  Administered 2023-11-25: 1 mg via INTRAVENOUS

## 2023-11-25 MED ORDER — SODIUM CHLORIDE 0.9% FLUSH: 3.0000 mL | INTRAVENOUS | Status: DC | PRN

## 2023-11-25 MED ORDER — HYDRALAZINE HCL 20 MG/ML IJ SOLN: 10.0000 mg | INTRAMUSCULAR | Status: DC | PRN

## 2023-11-25 SURGICAL SUPPLY — 12 items
CATH INFINITI 5 FR JL3.5 (CATHETERS) IMPLANT
CATH INFINITI AMBI 5FR TG (CATHETERS) IMPLANT
CATH LAUNCHER 6FR EBU 3.75 (CATHETERS) IMPLANT
CATH VISTA GUIDE 6FR XB3.5 EPK (CATHETERS) IMPLANT
DEVICE RAD COMP TR BAND LRG (VASCULAR PRODUCTS) IMPLANT
GLIDESHEATH SLEND A-KIT 6F 22G (SHEATH) IMPLANT
GUIDEWIRE INQWIRE 1.5J.035X260 (WIRE) IMPLANT
GUIDEWIRE PRESSURE X 175 (WIRE) IMPLANT
KIT HEMO VALVE WATCHDOG (MISCELLANEOUS) IMPLANT
KIT SINGLE USE MANIFOLD (KITS) IMPLANT
PACK CARDIAC CATHETERIZATION (CUSTOM PROCEDURE TRAY) ×1 IMPLANT
SET ATX-X65L (MISCELLANEOUS) IMPLANT

## 2023-11-25 NOTE — H&P (Signed)
 OV 11/08/2023 copied for documentation   Cardiology Office Note   Date:  11/25/2023  ID:  Jeffery George, Jeffery George [birth year], MRN [id number] PCP: Levora Reyes SAUNDERS, MD  [facility] Providers Cardiologist:  Georganna Archer, MD Electrophysiologist:  OLE ONEIDA HOLTS, MD {    History of Present Illness Jeffery George is a 78 y.o. male with a past medical history of hypertension, diabetes mellitus, aortic stenosis here for follow-up appointment.  Echocardiogram revealed normal LVEF 65 to 70%.  Mild to moderate aortic stenosis with mean valve gradient of 19 mmHg.  Some shortness of breath since he had COVID.  Also chest burning.  Lots of fatigue.  Usually has to stop and rest doing mild exertion.  Has gained some weight about 30 pounds.  He did not receive any of the COVID-19 vaccines.  Has had COVID.  Tries to watch his salt intake.  He was last seen November 03, 2022.  Nonobstructive CAD was seen on coronary CTA with a CAC score of 760 which is 73 percentile for age/sex matched controls.  Borderline dilation of his ascending aorta at 39 mm.  Echocardiogram showed normal LV function.  Moderate AAS, mild AI.  Mean gradient 19 mmHg.  Today, he presents with coronary artery disease  with fatigue and vertigo. He is working with the [facility] for his medical issues.  He experiences significant fatigue over the past few months with decreased exercise tolerance, making activities like walking a mile or yard work difficult. He denies chest pain and shortness of breath. A CT scan in 2023 showed coronary artery calcification, and an echocardiogram indicated normal heart pump function. He has aortic valve calcification and a heart murmur diagnosed in his teenage years.  He experiences frequent episodes of vertigo with a sensation of the room spinning and balance loss, leading to several falls over the past year. He fell in the bathroom two days before Christmas and was unable to get up for 13 hours. He is unsure  about losing consciousness during these episodes. The vertigo has worsened throughout the year, and he is not on medication for it.  His blood pressure has been variable, previously as high as 150/72, but more recently recorded at 108/60. He monitors his blood pressure at home and notes recent improvement.  Reports no shortness of breath nor dyspnea on exertion. Reports no chest pain, pressure, or tightness. No edema, orthopnea, PND. Reports no palpitations.   Discussed the use of AI scribe software for clinical note transcription with the patient, who gave verbal consent to proceed.  ROS: pertinent ROS in HPI  Studies Reviewed     Coronary CTA 08/29/2021  IMPRESSION: 1. Minimal mixed non-obstructive CAD, CADRADS = 2.   2. Coronary calcium  score of 760. This was 75th percentile for age and sex matched control.   3. Normal coronary origin with right dominance.   4. Aortic Valve: Trileaflet. Heavily calcified along the commissures. Aortic valve calcium  score is 1799. Recommend echo correlation for possible aortic stenosis.   5. Aorta: Borderline dilated at 39 mm at the mid ascending aorta (level of the PA bifurcation) measured double oblique. Aortic atherosclerosis. No dissection.   6. Dilated main pulmonary artery to 32 mm, suggestive of pulmonary hypertension.     Electronically Signed   By: Vinie JAYSON Maxcy M.D.   On: 08/29/2021 15:28  Physical Exam VS:  BP (!) 150/61   Pulse 61   Temp 97.8 F (36.6 C) (Oral)   Resp 19  Ht 6' (1.829 m)   Wt 112.5 kg   SpO2 98%   BMI 33.63 kg/m        Wt Readings from Last 3 Encounters:  11/25/23 112.5 kg  11/08/23 115.2 kg  08/11/23 110.9 kg    GEN: Well nourished, well developed in no acute distress NECK: No JVD; No carotid bruits CARDIAC: RRR, no murmurs, rubs, gallops RESPIRATORY:  Clear to auscultation without rales, wheezing or rhonchi  ABDOMEN: Soft, non-tender, non-distended EXTREMITIES:  No edema; No deformity    ASSESSMENT AND PLAN   Coronary artery disease Coronary calcification noted on previous CT. Fatigue and decreased exercise tolerance suggest possible progression. Normal ejection fraction on last echocardiogram. - Order updated echocardiogram to assess ejection fraction and aortic valve. - Order updated CT scan to evaluate coronary arteries.  Aortic valve stenosis Aortic valve calcification present. Discussed TAVR as a less invasive option. Reassured about TAVR's benefits. - Order updated echocardiogram to assess aortic valve status, back in 2023 was classified as mild.   Hypertension Recent improvement in blood pressure. Advised to maintain around 130 to prevent dizziness. - Monitor blood pressure at home. - Report if blood pressure exceeds 150 systolic or drops below 110 systolic.  Vertigo Progressive vertigo with frequent falls. Discussed potential benefit of physical therapy for inner ear issues. - [facility] and [facility].  General Health Maintenance Cholesterol and A1c levels within acceptable range. Advised to monitor diet for cholesterol and blood sugar management.  ADDENDUM:  Spoke with the patient 8/11 regarding cardiac cath.   The patient understands that risks include but are not limited to stroke (1 in 1000), death (1 in 1000), kidney failure [usually temporary] (1 in 500), bleeding (1 in 200), allergic reaction [possibly serious] (1 in 200), and agrees to proceed.       Dispo: He can follow-up in 3-4 months with Dr. Floretta.   Signed, Newman JINNY Lawrence, MD    Addendum: 78 year old male with hypertension, coronary artery disease, mild to moderate aortic stenosis, with worsening exertional dyspnea symptoms without chest pain, coronary CTA showing severe prox LAD and mid Lcx stenoses  Patient's last echocardiogram that I can see was from 07/2021, showed mild to moderate aortic valve stenosis with AVA 1.5 cm, mean  gradient 19 mmHg.  On my physical exam today, he has loud aortic stenosis murmur.  It would be important to know if his aortic stenosis is now increased in severity before further decision making.  Echocardiogram is scheduled for 12/14/2023.  I recommend that we perform diagnostic angiogram today to evaluate coronary anatomy, obtain echocardiogram, and then have further discussion regarding possibly two-vessel PCI plus TAVR versus CABG plus AVR.  If opted for PCI plus TAVR, will also be worth discussing whether he should have PCI before TAVR or TAVR before PCI.  Newman JINNY Lawrence, MD

## 2023-11-25 NOTE — Discharge Instructions (Signed)

## 2023-11-25 NOTE — Interval H&P Note (Signed)
 History and Physical Interval Note:  11/25/2023 10:18 AM  Jeffery George  has presented today for surgery, with the diagnosis of coronary artery disease.  The various methods of treatment have been discussed with the patient and family. After consideration of risks, benefits and other options for treatment, the patient has consented to  Procedure(s): LEFT HEART CATH AND CORONARY ANGIOGRAPHY (N/A) as a surgical intervention.  The patient's history has been reviewed, patient examined, no change in status, stable for surgery.  I have reviewed the patient's chart and labs.  Questions were answered to the patient's satisfaction.     Matea Stanard J Meridian Scherger

## 2023-11-25 NOTE — Progress Notes (Signed)
 Tr band removed at 1330, gauze dressing applied. Right radial level 0, clean, dry, and intact. Patient walked to the bathroom without difficulties.

## 2023-11-26 ENCOUNTER — Encounter (HOSPITAL_COMMUNITY): Payer: Self-pay | Admitting: Cardiology

## 2023-11-26 ENCOUNTER — Ambulatory Visit: Payer: Self-pay | Admitting: Physician Assistant

## 2023-12-14 ENCOUNTER — Ambulatory Visit (HOSPITAL_COMMUNITY)
Admission: RE | Admit: 2023-12-14 | Discharge: 2023-12-14 | Disposition: A | Discharge status: Home / Self Care | Source: Ambulatory Visit | Attending: Cardiology | Admitting: Cardiology

## 2023-12-14 DIAGNOSIS — I2510 Atherosclerotic heart disease of native coronary artery without angina pectoris: Secondary | ICD-10-CM | POA: Diagnosis not present

## 2023-12-14 DIAGNOSIS — R0789 Other chest pain: Secondary | ICD-10-CM | POA: Diagnosis not present

## 2023-12-14 LAB — ECHOCARDIOGRAM COMPLETE
AR max vel: 1.59 cm2
AV Area VTI: 1.67 cm2
AV Area mean vel: 1.54 cm2
AV Mean grad: 24 mmHg
AV Peak grad: 34.8 mmHg
Ao pk vel: 2.95 m/s
Area-P 1/2: 3.19 cm2
P 1/2 time: 340 ms
S' Lateral: 2.9 cm

## 2024-01-03 DIAGNOSIS — H401131 Primary open-angle glaucoma, bilateral, mild stage: Secondary | ICD-10-CM | POA: Diagnosis not present

## 2024-01-12 ENCOUNTER — Other Ambulatory Visit: Payer: Self-pay | Admitting: Family Medicine

## 2024-01-12 DIAGNOSIS — E669 Obesity, unspecified: Secondary | ICD-10-CM

## 2024-01-13 ENCOUNTER — Other Ambulatory Visit: Payer: Self-pay

## 2024-01-13 DIAGNOSIS — I10 Essential (primary) hypertension: Secondary | ICD-10-CM

## 2024-01-13 MED ORDER — AMLODIPINE BESYLATE 5 MG PO TABS: 5.0000 mg | ORAL_TABLET | Freq: Every day | ORAL | 3 refills | Status: AC

## 2024-01-14 NOTE — Progress Notes (Signed)
 Jeffery George                                          MRN: [id number]   01/14/2024   The [facility] Quality Team Specialist reviewed this patient medical record for the purposes of chart review for care gap closure. The following were reviewed: chart review for care gap closure-kidney health evaluation for diabetes:eGFR  and uACR.    [facility] Quality Team

## 2024-02-02 ENCOUNTER — Other Ambulatory Visit: Payer: Self-pay | Admitting: Family Medicine

## 2024-02-02 DIAGNOSIS — R0789 Other chest pain: Secondary | ICD-10-CM

## 2024-02-02 DIAGNOSIS — R079 Chest pain, unspecified: Secondary | ICD-10-CM

## 2024-02-02 DIAGNOSIS — I350 Nonrheumatic aortic (valve) stenosis: Secondary | ICD-10-CM

## 2024-02-09 DIAGNOSIS — I7121 Aneurysm of the ascending aorta, without rupture: Secondary | ICD-10-CM | POA: Insufficient documentation

## 2024-02-09 NOTE — Assessment & Plan Note (Signed)
 Will get an echo 1 year which will give us  a good look at his dilated aortic root.

## 2024-02-09 NOTE — Assessment & Plan Note (Signed)
 Blood pressure is at goal.  No changes. - Continue amlodipine  5 mg AM, 2.5 PM - Continue losartan  100 mg daily -Continue Coreg  6.25 mg twice daily

## 2024-02-09 NOTE — Assessment & Plan Note (Signed)
 Discussed referring to Pharm.D. for GLP-1 agonist.  Since the patient has started losing weight on his new diet he wants to try dietary changes first before committing to a new medication.  I dont think this is unreasonable. - Can discuss progress with dietary changes next visit and revisit the need for GLP-1 if necessary

## 2024-02-09 NOTE — Progress Notes (Incomplete)
 Cardiology Office Note:   Date:  02/09/2024  ID:  Jeffery George, DOB Aug 27, 1945, MRN [id number] PCP: Levora Reyes SAUNDERS, MD  [facility] Providers Cardiologist:  Georganna Archer, MD Electrophysiologist:  OLE ONEIDA HOLTS, MD { Chief Complaint: No chief complaint on file.     History of Present Illness:   Jeffery George is a 78 y.o. male with a PMH of moderate AS, nonobstructive CAD, TAA, HTN, and DM 2 who presents for follow up.  Patient will last seen by Tessa in clinic on 11/08/2023 for hypertension and decreased exercise tolerance.  He underwent coronary CTA which was positive by CT FFR.  He underwent LHC on 8/14 which showed moderate nonobstructive CAD.  Echocardiography showed preserved LV systolic function and moderate AS and grade 1 DD.    Past Medical History:  Diagnosis Date   Arthritis    Diabetes mellitus without complication (HCC)    Heart murmur    Hypertension      Studies Reviewed:    EKG: ***       Cardiac Studies & Procedures   ______________________________________________________________________________________________ CARDIAC CATHETERIZATION  CARDIAC CATHETERIZATION 11/25/2023  Conclusion Images from the original result were not included. Coronary angiography 11/25/2023: LM: No significant disease LAD: Filling defect in ostial LAD, likely periarterial calcification, with 40-50% proximal LAD disease RFR 0.94, physiologically nonsignificant Lcx: Large vessel.  Small AV groove circumflex with ostial 80% stenosis.  Mid circumflex 20% disease. RCA: Large dominant vessel, mid 50% disease.  LVEDP not checked    I suspect CT FFR may have overestimated severity of proximal LAD stenosis due to severe periarterial calcification. I do not see severe LAD stenosis based on invasive RFR measurement. I was not able to cross the aortic valve through radial access and using medic catheter.  Coronary CTA shows aortic valve calcium  score >3000.  Patient  most likely has severe aortic stenosis without any severe obstructive disease that needs intervention. Continue workup for severe aortic stenosis, with upcoming echocardiogram.  He will need TAVR evaluation after that.  Conclusion: Moderate nonobstructive coronary artery disease in large epicardial vessels. Severe stenosis in small caliber AV groove circumflex, does not need intervention. Suspected severe aortic stenosis  Unusually delayed procedure time and contrast use due to severe subclavian and aorta tortuosity, difficulty engaging the guide, requiring the use of multiple catheters.   Newman JINNY Lawrence, MD  Findings Coronary Findings Diagnostic  Dominance: Right  Left Anterior Descending Ost LAD to Prox LAD lesion is 50% stenosed.  Left Circumflex Mid Cx lesion is 20% stenosed.  Left Posterior Atrioventricular Artery LPAV lesion is 80% stenosed.  Right Coronary Artery Mid RCA lesion is 50% stenosed.  Intervention  No interventions have been documented.     ECHOCARDIOGRAM  ECHOCARDIOGRAM COMPLETE 12/14/2023  Narrative ECHOCARDIOGRAM REPORT    Patient Name:   Jeffery George Date of Exam: 12/14/2023 Medical Rec #:  [id number]      Height:       72.0 in Accession #:    [id number]     Weight:       248.0 lb Date of Birth:  07-25-1945      BSA:          2.335 m Patient Age:    78 years       BP:           150/64 mmHg Patient Gender: M              HR:  72 bpm. Exam Location:  [address]  Procedure: 2D Echo, Cardiac Doppler and Color Doppler (Both Spectral and Color Flow Doppler were utilized during procedure).  Indications:    I35.0 Aortic Stenosis  History:        Patient has prior history of Echocardiogram examinations, most recent 08/06/2021. Signs/Symptoms:Murmur; Risk Factors:Hypertension and Diabetes.  Sonographer:    Carl Rodgers-Shaddix RDCS Referring Phys: [id number] TESSA N CONTE  IMPRESSIONS   1. Left ventricular ejection fraction, by  estimation, is 60 to 65%. The left ventricle has normal function. The left ventricle has no regional wall motion abnormalities. There is mild concentric left ventricular hypertrophy. Left ventricular diastolic parameters are consistent with Grade I diastolic dysfunction (impaired relaxation). 2. Right ventricular systolic function is normal. The right ventricular size is normal. Tricuspid regurgitation signal is inadequate for assessing PA pressure. 3. The mitral valve is normal in structure. Mild mitral valve regurgitation. No evidence of mitral stenosis. 4. The aortic valve is tricuspid. There is severe calcifcation of the aortic valve. Aortic valve regurgitation is trivial. Moderate aortic valve stenosis. Aortic valve mean gradient measures 24.0 mmHg, AVA 1.57 cm^2. 5. Aortic dilatation noted. There is mild dilatation of the aortic root, measuring 42 mm. There is mild dilatation of the ascending aorta, measuring 41 mm. 6. The inferior vena cava is normal in size with <50% respiratory variability, suggesting right atrial pressure of 8 mmHg.  FINDINGS Left Ventricle: Left ventricular ejection fraction, by estimation, is 60 to 65%. The left ventricle has normal function. The left ventricle has no regional wall motion abnormalities. The left ventricular internal cavity size was normal in size. There is mild concentric left ventricular hypertrophy. Left ventricular diastolic parameters are consistent with Grade I diastolic dysfunction (impaired relaxation).  Right Ventricle: The right ventricular size is normal. No increase in right ventricular wall thickness. Right ventricular systolic function is normal. Tricuspid regurgitation signal is inadequate for assessing PA pressure.  Left Atrium: Left atrial size was normal in size.  Right Atrium: Right atrial size was normal in size.  Pericardium: There is no evidence of pericardial effusion.  Mitral Valve: The mitral valve is normal in structure. There  is mild calcification of the mitral valve leaflet(s). Mild mitral valve regurgitation. No evidence of mitral valve stenosis.  Tricuspid Valve: The tricuspid valve is normal in structure. Tricuspid valve regurgitation is not demonstrated.  The aortic valve is tricuspid. There is severe calcifcation of the aortic valve. Aortic valve regurgitation is trivial. Moderate aortic stenosis is present. Pulmonic Valve: The pulmonic valve was normal in structure. Pulmonic valve regurgitation is trivial.  Aorta: Aortic dilatation noted. There is mild dilatation of the aortic root, measuring 42 mm. There is mild dilatation of the ascending aorta, measuring 41 mm.  Venous: The inferior vena cava is normal in size with less than 50% respiratory variability, suggesting right atrial pressure of 8 mmHg.  IAS/Shunts: No atrial level shunt detected by color flow Doppler.   LEFT VENTRICLE PLAX 2D LVIDd:         4.70 cm   Diastology LVIDs:         2.90 cm   LV e' medial:    6.31 cm/s LV PW:         1.30 cm   LV E/e' medial:  18.4 LV IVS:        1.30 cm   LV e' lateral:   9.08 cm/s LVOT diam:     2.20 cm   LV E/e' lateral: 12.8 LV  SV:         112 LV SV Index:   48 LVOT Area:     3.80 cm   RIGHT VENTRICLE             IVC RV Basal diam:  4.30 cm     IVC diam: 1.60 cm RV S prime:     14.85 cm/s TAPSE (M-mode): 2.6 cm  LEFT ATRIUM             Index        RIGHT ATRIUM           Index LA diam:        4.80 cm 2.06 cm/m   RA Area:     12.10 cm LA Vol (A2C):   81.2 ml 34.78 ml/m  RA Volume:   27.10 ml  11.61 ml/m LA Vol (A4C):   52.4 ml 22.45 ml/m LA Biplane Vol: 68.7 ml 29.43 ml/m AORTIC VALVE AV Area (Vmax):    1.59 cm AV Area (Vmean):   1.54 cm AV Area (VTI):     1.67 cm AV Vmax:           294.75 cm/s AV Vmean:          205.750 cm/s AV VTI:            0.669 m AV Peak Grad:      34.8 mmHg AV Mean Grad:      24.0 mmHg LVOT Vmax:         123.00 cm/s LVOT Vmean:        83.550 cm/s LVOT VTI:           0.294 m LVOT/AV VTI ratio: 0.44 AI PHT:            340 msec  AORTA Ao Root diam: 4.20 cm Ao Asc diam:  4.10 cm  MITRAL VALVE MV Area (PHT): 3.19 cm     SHUNTS MV Decel Time: 238 msec     Systemic VTI:  0.29 m MV E velocity: 116.00 cm/s  Systemic Diam: 2.20 cm MV A velocity: 128.50 cm/s MV E/A ratio:  0.90  Dalton McleanMD Electronically signed by Ezra Kanner Signature Date/Time: 12/14/2023/10:25:19 AM    Final      CT SCANS  CT CORONARY MORPH W/CTA COR W/SCORE 11/15/2023  Addendum 11/15/2023  2:29 PM ADDENDUM REPORT: 11/15/2023 14:27  ADDENDUM: The following report is an over-read performed by radiologist Dr. Reyes Holder of [facility] on 11/15/2023. This over-read does not include interpretation of cardiac or coronary anatomy or pathology. The coronary calcium  score/coronary CTA interpretation by the cardiologist is attached.  COMPARISON:  CT Aug 29, 2021  FINDINGS: Vascular: No acute non-cardiac vascular finding.  Mediastinum/Nodes: No pathologically enlarged mediastinal, or hilar lymph nodes. Visualized portions of the esophagus are grossly unremarkable  Lungs/Pleura: Bibasilar atelectasis versus scarring. Hypoventilatory change in the dependent lungs.  Upper Abdomen: Visualized portions of the upper abdomen are unremarkable.  Musculoskeletal: Multilevel degenerative changes spine.  IMPRESSION: No acute non-cardiac vascular finding.   Electronically Signed By: Reyes Holder M.D. On: 11/15/2023 14:27  Narrative CLINICAL DATA:  Chest pain  EXAM: Cardiac/Coronary CTA  TECHNIQUE: A non-contrast, gated CT scan was obtained with axial slices of 2.5 mm through the heart for calcium  scoring. Calcium  scoring was performed using the Agatston method. A 120 kV prospective, gated, contrast cardiac CT scan was obtained. Gantry rotation speed was 230 msec and collimation was 0.63 mm. Two sublingual nitroglycerin  tablets (0.8 mg)  were given. The 3D data set was reconstructed with motion correction for the best systolic or diastolic phase. Images were analyzed on a dedicated workstation using MPR, MIP, and VRT modes. The patient received 95 cc of contrast.  FINDINGS: Image quality: Good  Noise artifact is: Limited.  Coronary Arteries:  Normal coronary origin.  Right dominance.  Left main: The left main is a large caliber vessel with a normal take off from the left coronary cusp that trifurcates into a LAD, LCX, and ramus intermedius. Calcified plaque in left main causes 25-49% stenosis  Left anterior descending artery: The LAD is patent. The LAD gives off 2 patent diagonal branches. Mixed plaque in proximal LAD causes 50-69% stenosis. Mixed plaque in mid LAD causes 25-49% stenosis  Left circumflex artery: The LCX is non-dominant and patent. The LCX gives off 1 patent obtuse marginal branch. Calcified plaque in proximal LCX causes 25-49% stenosis. Noncalcified plaque in mid LCX causes 70-99% stenosis  Right coronary artery: The RCA is dominant with normal take off from the right coronary cusp. the RCA terminates as a PDA and right posterolateral branch. Mixed plaque in proximal RCA causes 0-24% stenosis. Calcified plaque in mid RCA causes 25-49% stenosis  Right Atrium: Right atrial size is within normal limits.  Right Ventricle: The right ventricular cavity is within normal limits.  Left Atrium: Left atrial size is mildly enlarged with no left atrial appendage filling defect.  Left Ventricle: The ventricular cavity size is within normal limits.  Pulmonary arteries: Dilated main pulmonary artery measuring 33mm  Pulmonary veins: Normal pulmonary venous drainage.  Pericardium: Normal thickness without significant effusion or calcium  present.  Cardiac valves: Severe AV calcifications (AV calcium  score 3699). The mitral valve is normal without significant calcification.  Aorta: Dilated aortic root  measuring 41mm and ascending aorta measuring 40mm  Extra-cardiac findings: See attached radiology report for non-cardiac structures.  IMPRESSION: 1. Coronary calcium  score of 1115. This was 77th percentile for age-, sex, and race-matched controls.  2. Total plaque volume 956mm3 which is 67th percentile for age- and sex-matched controls (calcified plaque 353mm3; non-calcified plaque 62mm3). TPV is extensive  3.   Normal coronary origin with right dominance.  4.   Severe (70-99%) stenosis in mid LCX  5.   Moderate (50-69%) stenosis in proximal LAD  6. Mild (25-49%) stenosis in left main, mid LAD, proximal LCX, and mid RCA  7.   Minimal (0-24%) stenosis in proximal RCA  8. Severe aortic valve calcifications (AV calcium  score 3699). Recommend echocardiogram to assess aortic stenosis  9. Dilated aortic root measuring 41mm and ascending aorta measuring 40mm  10.  Dilated main pulmonary artery measuring 33mm  11.  Will send study for CTFFR  RECOMMENDATIONS: CAD-RADS 4: Severe stenosis. (70-99% or > 50% left main). Cardiac catheterization or CT FFR is recommended. Consider symptom-guided anti-ischemic pharmacotherapy as well as risk factor modification per guideline directed care. Invasive coronary angiography recommended with revascularization per published guideline statements.  Electronically Signed: By: Lonni Nanas M.D. On: 11/15/2023 14:15   CT SCANS  CT CORONARY MORPH W/CTA COR W/SCORE 08/29/2021  Addendum 08/29/2021  3:31 PM ADDENDUM REPORT: 08/29/2021 15:28  HISTORY: 78 yo male with chest pain, nonspecific  EXAM: Cardiac/Coronary CTA  TECHNIQUE: The patient was scanned on a [employer].  PROTOCOL: A 120 kV prospective scan was triggered in the descending thoracic aorta at 111 HU's. Axial non-contrast 3 mm slices were carried out through the heart. The data set was analyzed on a dedicated  work station and scored using the [employer] tire. Gantry rotation speed was 250 msecs and collimation was .6 mm. Beta blockade and 0.8 mg of sl NTG was given. The 3D data set was reconstructed in 5% intervals of the 35-75 % of the R-R cycle. Diastolic phases were analyzed on a dedicated work station using MPR, MIP and VRT modes. The patient received 100mL OMNIPAQUE  IOHEXOL  350 MG/ML SOLN of contrast.  FINDINGS: Quality: Fair, misregistration artifact, HR 52  Coronary calcium  score: The patient's coronary artery calcium  score is 760, which places the patient in the 75th percentile.  Coronary arteries: Normal coronary origins.  Right dominance.  Right Coronary Artery: Dominant. Minimal mixed 1-24% proximal stenosis (CADRADS1). Normal R-PDA and R-PLB branches.  Left Main Coronary Artery: Normal. Bifurcates into the LAD and LCx arteries.  Left Anterior Descending Coronary Artery: Large anterior artery that reaches the apex. There is mild 25-49% proximal mixed stenosis (CADRADS2). 2 moderate sized diagonal branches without disease.  Left Circumflex Artery: AV groove vessel - mis-registration in the mid vessel. There is mild 25-49% ostial mixed stenosis (CADRADS2).  Aorta: Borderline dilated at 39 mm at the mid ascending aorta (level of the PA bifurcation) measured double oblique. Aortic atherosclerosis. No dissection.  Aortic Valve: Trileaflet. Heavily calcified along the commissures. Aortic valve calcium  score is 1799  Other findings:  Normal pulmonary vein drainage into the left atrium.  Normal left atrial appendage without a thrombus.  Dilated main pulmonary artery to 32 mm, suggestive of pulmonary hypertension.  IMPRESSION: 1. Minimal mixed non-obstructive CAD, CADRADS = 2.  2. Coronary calcium  score of 760. This was 75th percentile for age and sex matched control.  3. Normal coronary origin with right dominance.  4. Aortic Valve: Trileaflet. Heavily calcified along the commissures. Aortic valve calcium   score is 1799. Recommend echo correlation for possible aortic stenosis.  5. Aorta: Borderline dilated at 39 mm at the mid ascending aorta (level of the PA bifurcation) measured double oblique. Aortic atherosclerosis. No dissection.  6. Dilated main pulmonary artery to 32 mm, suggestive of pulmonary hypertension.   Electronically Signed By: Vinie JAYSON Maxcy M.D. On: 08/29/2021 15:28  Narrative EXAM: OVER-READ INTERPRETATION  CT CHEST  The following report is a limited chest CT over-read performed by radiologist Dr. Elsie Shoulder of [facility] on 08/29/2021. This over-read does not include interpretation of cardiac or coronary anatomy or pathology. The coronary calcium  score/coronary CTA interpretation by the cardiologist is attached.  COMPARISON:  None Available.  FINDINGS: Vascular: There are no significant vascular findings.  Mediastinum/Nodes: There are no enlarged lymph nodes.The visualized esophagus demonstrates no significant findings.  Lungs/Pleura: Clear lungs. No pneumothorax or pleural effusion.  Upper abdomen: No acute abnormality.  Musculoskeletal/Chest wall: No chest wall abnormality. No acute or significant osseous findings.  IMPRESSION: 1. No significant extracardiac findings.  Electronically Signed: By: Elsie ONEIDA Shoulder M.D. On: 08/29/2021 14:02     ______________________________________________________________________________________________      Risk Assessment/Calculations:   {Does this patient have ATRIAL FIBRILLATION?:[phone]} No BP recorded.  {Refresh Note OR Click here to enter BP  :1}***        Physical Exam:     VS:  There were no vitals taken for this visit. ***    Wt Readings from Last 3 Encounters:  11/25/23 248 lb (112.5 kg)  11/08/23 254 lb (115.2 kg)  08/11/23 244 lb 9.6 oz (110.9 kg)     GEN: Well nourished, well developed, in no acute distress NECK: No JVD; No  carotid bruits CARDIAC: ***RRR, no  murmurs, rubs, gallops RESPIRATORY:  Clear to auscultation without rales, wheezing or rhonchi  ABDOMEN: Soft, non-tender, non-distended, normal bowel sounds EXTREMITIES:  Warm and well perfused, no edema; No deformity, 2+ radial pulses PSYCH: Normal mood and affect   Assessment & Plan Nonrheumatic aortic valve stenosis - Repeat echo in 1 year Primary hypertension - Continue amlodipine  5 mg AM, 2.5 PM - Continue losartan  100 mg daily -Continue Coreg  6.25 mg twice daily Mixed hyperlipidemia - Repeat lipids in 1 year, last echo Aneurysm of ascending aorta without rupture - Repeat CT scan of thoracic aorta in 1 year for annual surveillance Type 2 diabetes mellitus in patient with obesity (HCC) - Refer to Pharm.D. for GLP-1      {Are you ordering a CV Procedure (e.g. stress test, cath, DCCV, TEE, etc)?   Press F2        :[id number]}   This note was written with the assistance of a dictation microphone or AI dictation software. Please excuse any typos or grammatical errors.   Signed, Georganna Archer, MD 02/09/2024 7:43 PM    [facility]

## 2024-02-09 NOTE — Assessment & Plan Note (Signed)
 Asymptomatic from an AS standpoint.  He has moderate AS so we will need a repeat echo in 1 year. - Repeat echo in 1 year -Follow-up in 1 year

## 2024-02-09 NOTE — Assessment & Plan Note (Signed)
-   Repeat lipids in 1 year, last echo

## 2024-02-10 ENCOUNTER — Ambulatory Visit: Attending: Internal Medicine | Admitting: Student in an Organized Health Care Education/Training Program

## 2024-02-10 ENCOUNTER — Encounter: Payer: Self-pay | Admitting: Student in an Organized Health Care Education/Training Program

## 2024-02-10 VITALS — BP 124/56 | HR 70 | Ht 72.0 in | Wt 254.0 lb

## 2024-02-10 DIAGNOSIS — I7121 Aneurysm of the ascending aorta, without rupture: Secondary | ICD-10-CM | POA: Diagnosis not present

## 2024-02-10 DIAGNOSIS — I350 Nonrheumatic aortic (valve) stenosis: Secondary | ICD-10-CM | POA: Diagnosis not present

## 2024-02-10 DIAGNOSIS — E669 Obesity, unspecified: Secondary | ICD-10-CM | POA: Diagnosis not present

## 2024-02-10 DIAGNOSIS — E782 Mixed hyperlipidemia: Secondary | ICD-10-CM

## 2024-02-10 DIAGNOSIS — I10 Essential (primary) hypertension: Secondary | ICD-10-CM

## 2024-02-10 DIAGNOSIS — E119 Type 2 diabetes mellitus without complications: Secondary | ICD-10-CM

## 2024-02-10 NOTE — Patient Instructions (Addendum)
  Lab Work in 1 year: Lipid panel CRP BMP  If you have labs (blood work) drawn today and your tests are completely normal, you will receive your results only by: MyChart Message (if you have MyChart) OR A paper copy in the mail If you have any lab test that is abnormal or we need to change your treatment, we will call you to review the results.  Testing/Procedures: ECHOCARDIOGRAM IN 1 YEAR   Your physician has requested that you have an echocardiogram. Echocardiography is a painless test that uses sound waves to create images of your heart. It provides your doctor with information about the size and shape of your heart and how well your heart's chambers and valves are working. This procedure takes approximately one hour. There are no restrictions for this procedure. Please do NOT wear cologne, perfume, aftershave, or lotions (deodorant is allowed). Please arrive 15 minutes prior to your appointment time.  Please note: We ask at that you not bring children with you during ultrasound (echo/ vascular) testing. Due to room size and safety concerns, children are not allowed in the ultrasound rooms during exams. Our front office staff cannot provide observation of children in our lobby area while testing is being conducted. An adult accompanying a patient to their appointment will only be allowed in the ultrasound room at the discretion of the ultrasound technician under special circumstances. We apologize for any inconvenience.   Follow-Up: At [facility], you and your health needs are our priority.  As part of our continuing mission to provide you with exceptional heart care, our providers are all part of one team.  This team includes your primary Cardiologist (physician) and Advanced Practice Providers or APPs (Physician Assistants and Nurse Practitioners) who all work together to provide you with the care you need, when you need it.  Your next appointment:   1 year(s) AFTER ECHO AND  LABS   Provider:   Georganna Archer, MD

## 2024-02-11 ENCOUNTER — Ambulatory Visit: Admitting: Family Medicine

## 2024-02-11 ENCOUNTER — Encounter: Payer: Self-pay | Admitting: Family Medicine

## 2024-02-11 VITALS — BP 118/60 | HR 63 | Temp 98.0°F | Resp 29 | Ht 72.0 in | Wt 254.6 lb

## 2024-02-11 DIAGNOSIS — E785 Hyperlipidemia, unspecified: Secondary | ICD-10-CM

## 2024-02-11 DIAGNOSIS — I10 Essential (primary) hypertension: Secondary | ICD-10-CM

## 2024-02-11 DIAGNOSIS — E119 Type 2 diabetes mellitus without complications: Secondary | ICD-10-CM

## 2024-02-11 DIAGNOSIS — Z0000 Encounter for general adult medical examination without abnormal findings: Secondary | ICD-10-CM

## 2024-02-11 DIAGNOSIS — I350 Nonrheumatic aortic (valve) stenosis: Secondary | ICD-10-CM | POA: Diagnosis not present

## 2024-02-11 DIAGNOSIS — Z7984 Long term (current) use of oral hypoglycemic drugs: Secondary | ICD-10-CM

## 2024-02-11 DIAGNOSIS — H9190 Unspecified hearing loss, unspecified ear: Secondary | ICD-10-CM | POA: Insufficient documentation

## 2024-02-11 DIAGNOSIS — M25519 Pain in unspecified shoulder: Secondary | ICD-10-CM | POA: Insufficient documentation

## 2024-02-11 DIAGNOSIS — E669 Obesity, unspecified: Secondary | ICD-10-CM

## 2024-02-11 DIAGNOSIS — E65 Localized adiposity: Secondary | ICD-10-CM | POA: Insufficient documentation

## 2024-02-11 LAB — COMPREHENSIVE METABOLIC PANEL WITH GFR
ALT: 29 U/L (ref 0–53)
AST: 24 U/L (ref 0–37)
Albumin: 4.5 g/dL (ref 3.5–5.2)
Alkaline Phosphatase: 44 U/L (ref 39–117)
BUN: 27 mg/dL — ABNORMAL HIGH (ref 6–23)
CO2: 27 meq/L (ref 19–32)
Calcium: 9.6 mg/dL (ref 8.4–10.5)
Chloride: 104 meq/L (ref 96–112)
Creatinine, Ser: 0.89 mg/dL (ref 0.40–1.50)
GFR: 82.7 mL/min (ref >60.00)
Glucose, Bld: 158 mg/dL — ABNORMAL HIGH (ref 70–99)
Potassium: 4.4 meq/L (ref 3.5–5.1)
Sodium: 138 meq/L (ref 135–145)
Total Bilirubin: 0.7 mg/dL (ref 0.2–1.2)
Total Protein: 7.5 g/dL (ref 6.0–8.3)

## 2024-02-11 LAB — MICROALBUMIN / CREATININE URINE RATIO
Creatinine,U: 80.9 mg/dL
Microalb Creat Ratio: 9.8 mg/g (ref 0.0–30.0)
Microalb, Ur: 0.8 mg/dL (ref 0.0–1.9)

## 2024-02-11 LAB — LIPID PANEL
Cholesterol: 120 mg/dL (ref 0–200)
HDL: 36.4 mg/dL — ABNORMAL LOW (ref >39.00)
LDL Cholesterol: 60 mg/dL (ref 0–99)
NonHDL: 83.91
Total CHOL/HDL Ratio: 3
Triglycerides: 119 mg/dL (ref 0.0–149.0)
VLDL: 23.8 mg/dL (ref 0.0–40.0)

## 2024-02-11 LAB — HEMOGLOBIN A1C: Hgb A1c MFr Bld: 8.2 % — ABNORMAL HIGH (ref 4.6–6.5)

## 2024-02-11 NOTE — Patient Instructions (Signed)
 Thank you for coming in today. No change in medications at this time. If there are any concerns on your bloodwork, I will let you know.  Glad to hear the soreness has improved from the fall a few weeks ago, but please follow-up with me if the range of motion does not return to normal or any persistent soreness in the next week or 2.  Take care!   Preventive Care 24 Years and Older, Male Preventive care refers to lifestyle choices and visits with your health care provider that can promote health and wellness. Preventive care visits are also called wellness exams. What can I expect for my preventive care visit? Counseling During your preventive care visit, your health care provider may ask about your: Medical history, including: Past medical problems. Family medical history. History of falls. Current health, including: Emotional well-being. Home life and relationship well-being. Sexual activity. Memory and ability to understand (cognition). Lifestyle, including: Alcohol, nicotine or tobacco, and drug use. Access to firearms. Diet, exercise, and sleep habits. Work and work astronomer. Sunscreen use. Safety issues such as seatbelt and bike helmet use. Physical exam Your health care provider will check your: Height and weight. These may be used to calculate your BMI (body mass index). BMI is a measurement that tells if you are at a healthy weight. Waist circumference. This measures the distance around your waistline. This measurement also tells if you are at a healthy weight and may help predict your risk of certain diseases, such as type 2 diabetes and high blood pressure. Heart rate and blood pressure. Body temperature. Skin for abnormal spots. What immunizations do I need?  Vaccines are usually given at various ages, according to a schedule. Your health care provider will recommend vaccines for you based on your age, medical history, and lifestyle or other factors, such as travel or  where you work. What tests do I need? Screening Your health care provider may recommend screening tests for certain conditions. This may include: Lipid and cholesterol levels. Diabetes screening. This is done by checking your blood sugar (glucose) after you have not eaten for a while (fasting). Hepatitis C test. Hepatitis B test. HIV (human immunodeficiency virus) test. STI (sexually transmitted infection) testing, if you are at risk. Lung cancer screening. Colorectal cancer screening. Prostate cancer screening. Abdominal aortic aneurysm (AAA) screening. You may need this if you are a current or former smoker. Talk with your health care provider about your test results, treatment options, and if necessary, the need for more tests. Follow these instructions at home: Eating and drinking  Eat a diet that includes fresh fruits and vegetables, whole grains, lean protein, and low-fat dairy products. Limit your intake of foods with high amounts of sugar, saturated fats, and salt. Take vitamin and mineral supplements as recommended by your health care provider. Do not drink alcohol if your health care provider tells you not to drink. If you drink alcohol: Limit how much you have to 0-2 drinks a day. Know how much alcohol is in your drink. In the [place] one drink equals one 12 oz bottle of beer (355 mL), one 5 oz glass of wine (148 mL), or one 1 oz glass of hard liquor (44 mL). Lifestyle Brush your teeth every morning and night with fluoride toothpaste. Floss one time each day. Exercise for at least 30 minutes 5 or more days each week. Do not use any products that contain nicotine or tobacco. These products include cigarettes, chewing tobacco, and vaping devices, such as  e-cigarettes. If you need help quitting, ask your health care provider. Do not use drugs. If you are sexually active, practice safe sex. Use a condom or other form of protection to prevent STIs. Take aspirin  only as told by your  health care provider. Make sure that you understand how much to take and what form to take. Work with your health care provider to find out whether it is safe and beneficial for you to take aspirin  daily. Ask your health care provider if you need to take a cholesterol-lowering medicine (statin). Find healthy ways to manage stress, such as: Meditation, yoga, or listening to music. Journaling. Talking to a trusted person. Spending time with friends and family. Safety Always wear your seat belt while driving or riding in a vehicle. Do not drive: If you have been drinking alcohol. Do not ride with someone who has been drinking. When you are tired or distracted. While texting. If you have been using any mind-altering substances or drugs. Wear a helmet and other protective equipment during sports activities. If you have firearms in your house, make sure you follow all gun safety procedures. Minimize exposure to UV radiation to reduce your risk of skin cancer. What's next? Visit your health care provider once a year for an annual wellness visit. Ask your health care provider how often you should have your eyes and teeth checked. Stay up to date on all vaccines. This information is not intended to replace advice given to you by your health care provider. Make sure you discuss any questions you have with your health care provider. Document Revised: 09/25/2020 Document Reviewed: 09/25/2020 [employer] Patient Education  2024 [employer].

## 2024-02-11 NOTE — Progress Notes (Signed)
 Subjective:  Patient ID: Jeffery George, male    DOB: 06/26/1945  Age: 78 y.o. MRN: [id number]  CC:  Chief Complaint  Patient presents with   Annual Exam    No questions or concerns today    HPI [name] presents for Annual Exam  PCP, me Cardiology, Dr. Floretta, office visit yesterday.  Left heart cath in August with moderate nonobstructive CAD.  Preserved LV systolic function on echo.  Nonrheumatic aortic valve stenosis, asymptomatic, moderate AAS with planned repeat echo in 1 year.  Continue amlodipine , losartan , carvedilol  for hypertension at goal.  Repeat lipids in 1 year with hs-CRP in 1 year, and evaluation of dilated aortic root at echo in 1 year.  Additionally discussed meeting with Pharm.D. for possible GLP-1 agonist but since he had been losing weight on the diet deferred initially, with plan to discuss GLP-1 again in the future if necessary. Optometry, Dr. Abigail ENT, Dr. Tobie, hearing loss, tinnitus.   Diabetes: Complicated by obesity, prior history of hyperglycemia, microalbuminuria, stable on metformin  500 mg twice daily, on statin with Lipitor and ARB with losartan .  Denies any side effects with meds. Higher dosing of meformin caused diarrhea.  Home readings: elevated readings few weeks again in 200's, then better recently with improved diet 130-140  No symptomatic lows Microalbumin: Normal ratio 11/06/2022 Optho, foot exam, pneumovax: Up-to-date Diabetic Foot Exam - Simple   Simple Foot Form Diabetic Foot exam was performed with the following findings: Yes 02/11/2024 10:45 AM  Visual Inspection See comments: Yes Sensation Testing Intact to touch and monofilament testing bilaterally: Yes Pulse Check Posterior Tibialis and Dorsalis pulse intact bilaterally: Yes Comments Thickening of toenails. Calus on bottom of pad of great toe     Lab Results  Component Value Date   HGBA1C 6.9 (H) 08/11/2023   HGBA1C 6.6 (H) 02/10/2023   HGBA1C 6.8 (H) 11/06/2022    Lab Results  Component Value Date   MICROALBUR 0.6 12/10/2014   LDLCALC 60 08/11/2023   CREATININE 0.89 11/11/2023    Hypertension: With history of CAD, TIA, treated with amlodipine , carvedilol , losartan  and Plavix .  Cardiology eval recently as above. No new bleeding.  Home readings - elevated a times, bette with new diet  BP Readings from Last 3 Encounters:  02/11/24 118/60  02/10/24 (!) 124/56  11/25/23 116/62   Lab Results  Component Value Date   CREATININE 0.89 11/11/2023    Hyperlipidemia: History of TIA, treated with Lipitor 10 mg, LDL looked good in April.  No new myalgias or side effects. Lab Results  Component Value Date   CHOL 129 08/11/2023   HDL 41.10 08/11/2023   LDLCALC 60 08/11/2023   TRIG 141.0 08/11/2023   CHOLHDL 3 08/11/2023   Lab Results  Component Value Date   ALT 22 08/11/2023   AST 18 08/11/2023   ALKPHOS 47 08/11/2023   BILITOT 0.9 08/11/2023  Discussed phq - some financial stress at times - declines meds for depression or new treatments at this time. Able to afford current meds      02/11/2024   10:51 AM 08/11/2023   10:21 AM 02/11/2023   11:12 AM 02/10/2023    9:30 AM 11/06/2022    8:24 AM  Depression screen PHQ 2/9  Decreased Interest 2 2 1 1 1   Down, Depressed, Hopeless 1 1 0 0 0  PHQ - 2 Score 3 3 1 1 1   Altered sleeping 2 3 1 1 1   Tired, decreased energy 2 0  1 1 3   Change in appetite 1 0 0 0 0  Feeling bad or failure about yourself  1 0 0 0 1  Trouble concentrating 1 0 0 0 0  Moving slowly or fidgety/restless 0 0 0 0 0  Suicidal thoughts 0 0 0 0 0  PHQ-9 Score 10 6 3 3 6   Difficult doing work/chores Somewhat difficult        Health Maintenance  Topic Date Due   Diabetic kidney evaluation - Urine ACR  12/19/2020   DTaP/Tdap/Td (2 - Td or Tdap) 05/02/2023   Medicare Annual Wellness (AWV)  02/11/2024   HEMOGLOBIN A1C  02/10/2024   COVID-19 Vaccine (1 - 2025-26 season) 02/27/2024 (Originally 12/13/2023)   OPHTHALMOLOGY EXAM   06/30/2024   Diabetic kidney evaluation - eGFR measurement  11/10/2024   FOOT EXAM  02/10/2025   Pneumococcal Vaccine: 50+ Years  Completed   Influenza Vaccine  Completed   Hepatitis C Screening  Completed   Zoster Vaccines- Shingrix  Completed   Meningococcal B Vaccine  Aged Out   Colonoscopy  Discontinued  Colonoscopy in 2017. Few hyperplastic  polyps repeat 10 years.   Immunization History  Administered Date(s) Administered   Fluad Quad(high Dose 65+) 12/20/2019, 03/18/2022, 12/30/2022   INFLUENZA, HIGH DOSE SEASONAL PF 01/18/2017, 12/10/2017, 01/09/2023, 01/19/2024   Influenza,inj,Quad PF,6+ Mos 02/27/2013, 06/11/2014, 12/10/2014, 02/03/2016   Influenza-Unspecified 01/18/2017, 01/11/2019   Pneumococcal Conjugate-13 06/12/2015   Pneumococcal Polysaccharide-23 05/01/2013   Tdap 05/01/2013   Zoster Recombinant(Shingrix) 04/02/2022, 12/09/2022   Zoster, Live 05/09/2013  Tdap recommended at pharmacy. UTD otherwise.   No results found.routine visits with Dr. Abigail.   Dental: every 6 months.   Alcohol: none  Tobacco: none  Exercise: active with work, presenter, broadcasting, clearing brush, walking. Fall about 2 weeks ago. Some soreness in arm/shoulders, prior rotator cuff issues, doing better - more motion and improving.   Wt Readings from Last 3 Encounters:  02/11/24 254 lb 9.6 oz (115.5 kg)  02/10/24 254 lb (115.2 kg)  11/25/23 248 lb (112.5 kg)     History Patient Active Problem List   Diagnosis Date Noted   Central obesity 02/11/2024   Hearing loss 02/11/2024   Hypertension 02/11/2024   Shoulder joint pain 02/11/2024   Aneurysm of ascending aorta without rupture 02/09/2024   Hyperlipidemia 06/17/2022   Aortic valve stenosis 06/17/2022   History of COVID-19 05/08/2020   Cough 05/08/2020   Rotator cuff syndrome, left 11/15/2018   Left retinal detachment 08/11/2017   Essential hypertension, benign 02/27/2013   Type 2 diabetes mellitus in patient with obesity (HCC) 02/27/2013    Past Medical History:  Diagnosis Date   Arthritis    Diabetes mellitus without complication (HCC)    Heart murmur    Hypertension    Past Surgical History:  Procedure Laterality Date   CATARACT EXTRACTION     CORONARY PRESSURE/FFR STUDY N/A 11/25/2023   Procedure: CORONARY PRESSURE/FFR STUDY;  Surgeon: Elmira Newman PARAS, MD;  Location: [facility];  Service: Cardiovascular;  Laterality: N/A;   FRACTURE SURGERY     Lt ankle   GAS/FLUID EXCHANGE Left 08/12/2017   Procedure: C3F8 GAS/FLUID EXCHANGE;  Surgeon: Valdemar Rogue, MD;  Location: [facility] OR;  Service: Ophthalmology;  Laterality: Left;   HERNIA REPAIR     IRIDOTOMY / IRIDECTOMY Bilateral    stents   LEFT HEART CATH AND CORONARY ANGIOGRAPHY N/A 11/25/2023   Procedure: LEFT HEART CATH AND CORONARY ANGIOGRAPHY;  Surgeon: Elmira Newman PARAS, MD;  Location:  [facility];  Service: Cardiovascular;  Laterality: N/A;   VASECTOMY     VITRECTOMY 25 GAUGE WITH SCLERAL BUCKLE Left 08/12/2017   Procedure: VITRECTOMY 25 GAUGE WITH SCLERAL BUCKLE WITH ENDOLASER;  Surgeon: Valdemar Rogue, MD;  Location: [facility] OR;  Service: Ophthalmology;  Laterality: Left;   No Known Allergies Prior to Admission medications   Medication Sig Start Date End Date Taking? Authorizing Provider  Accu-Chek Softclix Lancets lancets Test blood sugar once daily. Dx E11.9 12/10/21  Yes Levora Reyes SAUNDERS, MD  amLODipine  (NORVASC ) 2.5 MG tablet TAKE 1 TABLET EVERY DAY. ADD TO 5MG  TABLET FOR TOTAL 7.5MG  DOSE. 08/05/23  Yes Levora Reyes SAUNDERS, MD  amLODipine  (NORVASC ) 5 MG tablet Take 1 tablet (5 mg total) by mouth daily. Combine with 2.5mg  for 7.5mg  dose. 01/13/24  Yes Levora Reyes SAUNDERS, MD  atorvastatin  (LIPITOR) 10 MG tablet Take 1 tablet (10 mg total) by mouth daily. 02/10/23  Yes Levora Reyes SAUNDERS, MD  blood glucose meter kit and supplies TrueMetrix, TruTrack, Accu-Chek Aviva Expert, Accu-Chek Aviva Plus, Accu-Chek Guide, Accu-Chek Nano, or Accu-Chek Smartview.   Use  once per day. 07/31/17  Yes Levora Reyes SAUNDERS, MD  carvedilol  (COREG ) 6.25 MG tablet TAKE 1 TABLET TWICE DAILY 02/03/24  Yes Levora Reyes SAUNDERS, MD  clopidogrel  (PLAVIX ) 75 MG tablet Take 1 tablet (75 mg total) by mouth daily. 02/10/23  Yes Levora Reyes SAUNDERS, MD  glucose blood (TRUE METRIX BLOOD GLUCOSE TEST) test strip 1 each by Other route 3 (three) times daily. Use as instructed 02/03/23  Yes Levora Reyes SAUNDERS, MD  Lancets [facility] ULTRASOFT) lancets Use as instructed 06/05/13  Yes Levora Reyes SAUNDERS, MD  latanoprost (XALATAN) 0.005 % ophthalmic solution Place 1 drop into both eyes at bedtime. 07/23/17  Yes [provider]  losartan  (COZAAR ) 100 MG tablet Take 1 tablet (100 mg total) by mouth daily. 02/10/23  Yes Levora Reyes SAUNDERS, MD  metFORMIN  (GLUCOPHAGE ) 500 MG tablet TAKE 1 TABLET TWICE DAILY WITH MEALS 01/12/24  Yes Levora Reyes SAUNDERS, MD  Multiple Vitamins-Minerals (MULTIVITAMIN WITH MINERALS) tablet Take 1 tablet by mouth daily.   Yes [provider]  Omega-3 Fatty Acids (FISH OIL) 1000 MG CAPS Take 1,000 mg by mouth daily.   Yes [provider]  TRUEplus Lancets 33G MISC 1 each by Does not apply route in the morning, at noon, and at bedtime. 08/05/21  Yes Kip Ade, NP   Social History   Socioeconomic History   Marital status: Married    Spouse name: Not on file   Number of children: Not on file   Years of education: Not on file   Highest education level: Not on file  Occupational History   Occupation: Pharmacologist  Tobacco Use   Smoking status: Former   Smokeless tobacco: Never  Substance and Sexual Activity   Alcohol use: No    Alcohol/week: 0.0 standard drinks of alcohol   Drug use: No   Sexual activity: Never  Other Topics Concern   Not on file  Social History Narrative   Married   Science Writer at [employer]   Social Drivers of Health   Financial Resource Strain: Low Risk  (02/11/2023)   Overall Financial Resource Strain  (CARDIA)    Difficulty of Paying Living Expenses: Not hard at all  Food Insecurity: No Food Insecurity (02/11/2023)   Hunger Vital Sign    Worried About Running Out of Food in the Last Year: Never true  Ran Out of Food in the Last Year: Never true  Transportation Needs: No Transportation Needs (02/11/2023)   PRAPARE - Administrator, Civil Service (Medical): No    Lack of Transportation (Non-Medical): No  Physical Activity: Inactive (02/11/2023)   Exercise Vital Sign    Days of Exercise per Week: 0 days    Minutes of Exercise per Session: 0 min  Stress: No Stress Concern Present (02/11/2023)   [employer] of Occupational Health - Occupational Stress Questionnaire    Feeling of Stress : Not at all  Social Connections: Socially Isolated (02/11/2023)   Social Connection and Isolation Panel    Frequency of Communication with Friends and Family: Once a week    Frequency of Social Gatherings with Friends and Family: Never    Attends Religious Services: Never    Database Administrator or Organizations: No    Attends Banker Meetings: Never    Marital Status: Married  Catering Manager Violence: Not At Risk (02/11/2023)   Humiliation, Afraid, Rape, and Kick questionnaire    Fear of Current or Ex-Partner: No    Emotionally Abused: No    Physically Abused: No    Sexually Abused: No    Review of Systems 13 point review of systems per patient health survey noted.  Negative other than as indicated above or in HPI.    Objective:   Vitals:   02/11/24 1048 02/11/24 1100  BP: (!) 142/62 118/60  Pulse: 63   Resp: (!) 29   Temp: 98 F (36.7 C)   TempSrc: Temporal   SpO2: 95%   Weight: 254 lb 9.6 oz (115.5 kg)   Height: 6' (1.829 m)      Physical Exam Vitals reviewed.  Constitutional:      Appearance: He is well-developed.  HENT:     Head: Normocephalic and atraumatic.     Right Ear: External ear normal.     Left Ear: External ear normal.  Eyes:      Conjunctiva/sclera: Conjunctivae normal.     Pupils: Pupils are equal, round, and reactive to light.  Neck:     Thyroid : No thyromegaly.  Cardiovascular:     Rate and Rhythm: Normal rate and regular rhythm.     Heart sounds: Normal heart sounds.  Pulmonary:     Effort: Pulmonary effort is normal. No respiratory distress.     Breath sounds: Normal breath sounds. No wheezing.  Abdominal:     General: There is no distension.     Palpations: Abdomen is soft.     Tenderness: There is no abdominal tenderness.  Musculoskeletal:        General: No tenderness. Normal range of motion.     Cervical back: Normal range of motion and neck supple.     Comments: Some discomfort with bilateral arm abduction, but only minimal decreased range of motion, equal bilaterally.  Lymphadenopathy:     Cervical: No cervical adenopathy.  Skin:    General: Skin is warm and dry.  Neurological:     Mental Status: He is alert and oriented to person, place, and time.     Deep Tendon Reflexes: Reflexes are normal and symmetric.  Psychiatric:        Behavior: Behavior normal.        Assessment & Plan:  LIEV BROCKBANK is a 78 y.o. male . Annual physical exam  - -anticipatory guidance as below in AVS, screening labs above. Health maintenance items as above in  HPI discussed/recommended as applicable.   Type 2 diabetes mellitus in patient with obesity (HCC) - Plan: Urine Microalbumin w/creat. ratio, Hemoglobin A1c, Comprehensive metabolic panel with GFR  - As above, he did have some elevated readings a few weeks ago, then improved with dietary changes.  Can keep this in mind with A1c and adjustment of plan.  No med changes for now.  Can certainly discuss GLP-1 to help both with weight loss as well as diabetes depending on readings.  Nonrheumatic aortic valve stenosis  - Followed closely by cardiology as above with planned follow-up to monitor dilated aortic root and lipids, CRP.  No changes.  Essential  hypertension, benign - Plan: Comprehensive metabolic panel with GFR  - Stable with current med regimen, cardiology eval as above.  No changes.  Hyperlipidemia, unspecified hyperlipidemia type - Plan: Comprehensive metabolic panel with GFR, Lipid panel  - As above, follow-up with cardiology, check lipids and adjust regimen accordingly but tolerating current dose of Lipitor.  As an aside he did have a fall with improving range of motion from shoulder soreness.  RTC precautions given if he does not regain range of motion or any persistent soreness within the next week or 2.  Deferred imaging at this time as improving.  No orders of the defined types were placed in this encounter.  Patient Instructions  Thank you for coming in today. No change in medications at this time. If there are any concerns on your bloodwork, I will let you know.  Glad to hear the soreness has improved from the fall a few weeks ago, but please follow-up with me if the range of motion does not return to normal or any persistent soreness in the next week or 2.  Take care!   Preventive Care 43 Years and Older, Male Preventive care refers to lifestyle choices and visits with your health care provider that can promote health and wellness. Preventive care visits are also called wellness exams. What can I expect for my preventive care visit? Counseling During your preventive care visit, your health care provider may ask about your: Medical history, including: Past medical problems. Family medical history. History of falls. Current health, including: Emotional well-being. Home life and relationship well-being. Sexual activity. Memory and ability to understand (cognition). Lifestyle, including: Alcohol, nicotine or tobacco, and drug use. Access to firearms. Diet, exercise, and sleep habits. Work and work astronomer. Sunscreen use. Safety issues such as seatbelt and bike helmet use. Physical exam Your health care  provider will check your: Height and weight. These may be used to calculate your BMI (body mass index). BMI is a measurement that tells if you are at a healthy weight. Waist circumference. This measures the distance around your waistline. This measurement also tells if you are at a healthy weight and may help predict your risk of certain diseases, such as type 2 diabetes and high blood pressure. Heart rate and blood pressure. Body temperature. Skin for abnormal spots. What immunizations do I need?  Vaccines are usually given at various ages, according to a schedule. Your health care provider will recommend vaccines for you based on your age, medical history, and lifestyle or other factors, such as travel or where you work. What tests do I need? Screening Your health care provider may recommend screening tests for certain conditions. This may include: Lipid and cholesterol levels. Diabetes screening. This is done by checking your blood sugar (glucose) after you have not eaten for a while (fasting). Hepatitis C test. Hepatitis  B test. HIV (human immunodeficiency virus) test. STI (sexually transmitted infection) testing, if you are at risk. Lung cancer screening. Colorectal cancer screening. Prostate cancer screening. Abdominal aortic aneurysm (AAA) screening. You may need this if you are a current or former smoker. Talk with your health care provider about your test results, treatment options, and if necessary, the need for more tests. Follow these instructions at home: Eating and drinking  Eat a diet that includes fresh fruits and vegetables, whole grains, lean protein, and low-fat dairy products. Limit your intake of foods with high amounts of sugar, saturated fats, and salt. Take vitamin and mineral supplements as recommended by your health care provider. Do not drink alcohol if your health care provider tells you not to drink. If you drink alcohol: Limit how much you have to 0-2  drinks a day. Know how much alcohol is in your drink. In the [place] one drink equals one 12 oz bottle of beer (355 mL), one 5 oz glass of wine (148 mL), or one 1 oz glass of hard liquor (44 mL). Lifestyle Brush your teeth every morning and night with fluoride toothpaste. Floss one time each day. Exercise for at least 30 minutes 5 or more days each week. Do not use any products that contain nicotine or tobacco. These products include cigarettes, chewing tobacco, and vaping devices, such as e-cigarettes. If you need help quitting, ask your health care provider. Do not use drugs. If you are sexually active, practice safe sex. Use a condom or other form of protection to prevent STIs. Take aspirin  only as told by your health care provider. Make sure that you understand how much to take and what form to take. Work with your health care provider to find out whether it is safe and beneficial for you to take aspirin  daily. Ask your health care provider if you need to take a cholesterol-lowering medicine (statin). Find healthy ways to manage stress, such as: Meditation, yoga, or listening to music. Journaling. Talking to a trusted person. Spending time with friends and family. Safety Always wear your seat belt while driving or riding in a vehicle. Do not drive: If you have been drinking alcohol. Do not ride with someone who has been drinking. When you are tired or distracted. While texting. If you have been using any mind-altering substances or drugs. Wear a helmet and other protective equipment during sports activities. If you have firearms in your house, make sure you follow all gun safety procedures. Minimize exposure to UV radiation to reduce your risk of skin cancer. What's next? Visit your health care provider once a year for an annual wellness visit. Ask your health care provider how often you should have your eyes and teeth checked. Stay up to date on all vaccines. This information is not  intended to replace advice given to you by your health care provider. Make sure you discuss any questions you have with your health care provider. Document Revised: 09/25/2020 Document Reviewed: 09/25/2020 Elsevier Patient Education  2024 Elsevier Inc.    Signed,   Reyes Pines, MD [facility], [facility] 02/11/24 11:23 AM

## 2024-02-15 ENCOUNTER — Ambulatory Visit: Payer: Medicare HMO | Admitting: *Deleted

## 2024-02-15 VITALS — Ht 72.0 in | Wt 254.0 lb

## 2024-02-15 DIAGNOSIS — Z0000 Encounter for general adult medical examination without abnormal findings: Secondary | ICD-10-CM

## 2024-02-15 NOTE — Patient Instructions (Signed)
 [name] , Thank you for taking time to come for your Medicare Wellness Visit. I appreciate your ongoing commitment to your health goals. Please review the following plan we discussed and let me know if I can assist you in the future.   Screening recommendations/referrals: Colonoscopy:  Recommended yearly ophthalmology/optometry visit for glaucoma screening and checkup Recommended yearly dental visit for hygiene and checkup  Vaccinations: Influenza vaccine:  Pneumococcal vaccine:  Tdap vaccine:  Shingles vaccine:        Preventive Care 65 Years and Older, Male Preventive care refers to lifestyle choices and visits with your health care provider that can promote health and wellness. What does preventive care include? A yearly physical exam. This is also called an annual well check. Dental exams once or twice a year. Routine eye exams. Ask your health care provider how often you should have your eyes checked. Personal lifestyle choices, including: Daily care of your teeth and gums. Regular physical activity. Eating a healthy diet. Avoiding tobacco and drug use. Limiting alcohol use. Practicing safe sex. Taking low doses of aspirin every day. Taking vitamin and mineral supplements as recommended by your health care provider. What happens during an annual well check? The services and screenings done by your health care provider during your annual well check will depend on your age, overall health, lifestyle risk factors, and family history of disease. Counseling  Your health care provider may ask you questions about your: Alcohol use. Tobacco use. Drug use. Emotional well-being. Home and relationship well-being. Sexual activity. Eating habits. History of falls. Memory and ability to understand (cognition). Work and work Astronomer. Screening  You may have the following tests or measurements: Height, weight, and BMI. Blood pressure. Lipid and cholesterol levels. These  may be checked every 5 years, or more frequently if you are over 3 years old. Skin check. Lung cancer screening. You may have this screening every year starting at age 12 if you have a 30-pack-year history of smoking and currently smoke or have quit within the past 15 years. Fecal occult blood test (FOBT) of the stool. You may have this test every year starting at age 92. Flexible sigmoidoscopy or colonoscopy. You may have a sigmoidoscopy every 5 years or a colonoscopy every 10 years starting at age 39. Prostate cancer screening. Recommendations will vary depending on your family history and other risks. Hepatitis C blood test. Hepatitis B blood test. Sexually transmitted disease (STD) testing. Diabetes screening. This is done by checking your blood sugar (glucose) after you have not eaten for a while (fasting). You may have this done every 1-3 years. Abdominal aortic aneurysm (AAA) screening. You may need this if you are a current or former smoker. Osteoporosis. You may be screened starting at age 4 if you are at high risk. Talk with your health care provider about your test results, treatment options, and if necessary, the need for more tests. Vaccines  Your health care provider may recommend certain vaccines, such as: Influenza vaccine. This is recommended every year. Tetanus, diphtheria, and acellular pertussis (Tdap, Td) vaccine. You may need a Td booster every 10 years. Zoster vaccine. You may need this after age 41. Pneumococcal 13-valent conjugate (PCV13) vaccine. One dose is recommended after age 14. Pneumococcal polysaccharide (PPSV23) vaccine. One dose is recommended after age 101. Talk to your health care provider about which screenings and vaccines you need and how often you need them. This information is not intended to replace advice given to you by your health  care provider. Make sure you discuss any questions you have with your health care provider. Document Released:  04/26/2015 Document Revised: 12/18/2015 Document Reviewed: 01/29/2015 Elsevier Interactive Patient Education  2017 [employer].  Fall Prevention in the Home Falls can cause injuries. They can happen to people of all ages. There are many things you can do to make your home safe and to help prevent falls. What can I do on the outside of my home? Regularly fix the edges of walkways and driveways and fix any cracks. Remove anything that might make you trip as you walk through a door, such as a raised step or threshold. Trim any bushes or trees on the path to your home. Use bright outdoor lighting. Clear any walking paths of anything that might make someone trip, such as rocks or tools. Regularly check to see if handrails are loose or broken. Make sure that both sides of any steps have handrails. Any raised decks and porches should have guardrails on the edges. Have any leaves, snow, or ice cleared regularly. Use sand or salt on walking paths during winter. Clean up any spills in your garage right away. This includes oil or grease spills. What can I do in the bathroom? Use night lights. Install grab bars by the toilet and in the tub and shower. Do not use towel bars as grab bars. Use non-skid mats or decals in the tub or shower. If you need to sit down in the shower, use a plastic, non-slip stool. Keep the floor dry. Clean up any water  that spills on the floor as soon as it happens. Remove soap buildup in the tub or shower regularly. Attach bath mats securely with double-sided non-slip rug tape. Do not have throw rugs and other things on the floor that can make you trip. What can I do in the bedroom? Use night lights. Make sure that you have a light by your bed that is easy to reach. Do not use any sheets or blankets that are too big for your bed. They should not hang down onto the floor. Have a firm chair that has side arms. You can use this for support while you get dressed. Do not have  throw rugs and other things on the floor that can make you trip. What can I do in the kitchen? Clean up any spills right away. Avoid walking on wet floors. Keep items that you use a lot in easy-to-reach places. If you need to reach something above you, use a strong step stool that has a grab bar. Keep electrical cords out of the way. Do not use floor polish or wax that makes floors slippery. If you must use wax, use non-skid floor wax. Do not have throw rugs and other things on the floor that can make you trip. What can I do with my stairs? Do not leave any items on the stairs. Make sure that there are handrails on both sides of the stairs and use them. Fix handrails that are broken or loose. Make sure that handrails are as long as the stairways. Check any carpeting to make sure that it is firmly attached to the stairs. Fix any carpet that is loose or worn. Avoid having throw rugs at the top or bottom of the stairs. If you do have throw rugs, attach them to the floor with carpet tape. Make sure that you have a light switch at the top of the stairs and the bottom of the stairs. If you do not  have them, ask someone to add them for you. What else can I do to help prevent falls? Wear shoes that: Do not have high heels. Have rubber bottoms. Are comfortable and fit you well. Are closed at the toe. Do not wear sandals. If you use a stepladder: Make sure that it is fully opened. Do not climb a closed stepladder. Make sure that both sides of the stepladder are locked into place. Ask someone to hold it for you, if possible. Clearly mark and make sure that you can see: Any grab bars or handrails. First and last steps. Where the edge of each step is. Use tools that help you move around (mobility aids) if they are needed. These include: Canes. Walkers. Scooters. Crutches. Turn on the lights when you go into a dark area. Replace any light bulbs as soon as they burn out. Set up your furniture so  you have a clear path. Avoid moving your furniture around. If any of your floors are uneven, fix them. If there are any pets around you, be aware of where they are. Review your medicines with your doctor. Some medicines can make you feel dizzy. This can increase your chance of falling. Ask your doctor what other things that you can do to help prevent falls. This information is not intended to replace advice given to you by your health care provider. Make sure you discuss any questions you have with your health care provider. Document Released: 01/24/2009 Document Revised: 09/05/2015 Document Reviewed: 05/04/2014 Elsevier Interactive Patient Education  2017 [employer].

## 2024-02-15 NOTE — Progress Notes (Signed)
 Subjective:   Jeffery George is a 78 y.o. male who presents for a Medicare Annual Wellness Visit.  Allergies (verified) Patient has no known allergies.   History: Past Medical History:  Diagnosis Date   Arthritis    Diabetes mellitus without complication (HCC)    Heart murmur    Hypertension    Past Surgical History:  Procedure Laterality Date   CATARACT EXTRACTION     CORONARY PRESSURE/FFR STUDY N/A 11/25/2023   Procedure: CORONARY PRESSURE/FFR STUDY;  Surgeon: Elmira Newman PARAS, MD;  Location: [facility];  Service: Cardiovascular;  Laterality: N/A;   FRACTURE SURGERY     Lt ankle   GAS/FLUID EXCHANGE Left 08/12/2017   Procedure: C3F8 GAS/FLUID EXCHANGE;  Surgeon: Valdemar Rogue, MD;  [facility];  Service: Ophthalmology;  Laterality: Left;   HERNIA REPAIR     IRIDOTOMY / IRIDECTOMY Bilateral    stents   LEFT HEART CATH AND CORONARY ANGIOGRAPHY N/A 11/25/2023   Procedure: LEFT HEART CATH AND CORONARY ANGIOGRAPHY;  Surgeon: Elmira Newman PARAS, MD;  Location: [facility];  Service: Cardiovascular;  Laterality: N/A;   VASECTOMY     VITRECTOMY 25 GAUGE WITH SCLERAL BUCKLE Left 08/12/2017   Procedure: VITRECTOMY 25 GAUGE WITH SCLERAL BUCKLE WITH ENDOLASER;  Surgeon: Valdemar Rogue, MD;  Location: [facility] OR;  Service: Ophthalmology;  Laterality: Left;   Family History  Problem Relation Age of Onset   Cancer Mother    Diabetes Mother    Stroke Mother    Heart disease Father    Stroke Father    Diabetes Brother    Heart disease Brother    Diabetes Brother    Colon cancer Neg Hx    Social History   Occupational History   Occupation: Pharmacologist  Tobacco Use   Smoking status: Former   Smokeless tobacco: Never  Advertising Account Planner   Vaping status: Never Used  Substance and Sexual Activity   Alcohol use: No    Alcohol/week: 0.0 standard drinks of alcohol   Drug use: No   Sexual activity: Never   Tobacco Counseling Counseling given: Not Answered  SDOH  Screenings   Food Insecurity: Food Insecurity Present (02/15/2024)  Housing: High Risk (02/15/2024)  Transportation Needs: No Transportation Needs (02/15/2024)  Utilities: Not At Risk (02/15/2024)  Alcohol Screen: Low Risk  (02/11/2023)  Depression (PHQ2-9): Low Risk  (02/15/2024)  Recent Concern: Depression (PHQ2-9) - Medium Risk (02/11/2024)  Financial Resource Strain: Medium Risk (02/14/2024)  Physical Activity: Inactive (02/15/2024)  Social Connections: Moderately Isolated (02/15/2024)  Stress: No Stress Concern Present (02/15/2024)  Tobacco Use: Medium Risk (02/15/2024)  Health Literacy: Adequate Health Literacy (02/15/2024)   Depression Screen    02/15/2024   11:43 AM 02/11/2024   10:51 AM 08/11/2023   10:21 AM 02/11/2023   11:12 AM 02/10/2023    9:30 AM 11/06/2022    8:24 AM 06/17/2022   10:08 AM  PHQ 2/9 Scores  PHQ - 2 Score 0 3 3 1 1 1 2   PHQ- 9 Score 2 10 6 3 3 6 4      Goals Addressed             This Visit's Progress    Patient Stated       Would like to loose weight        Visit info / Clinical Intake: Medicare Wellness Visit Type:: Subsequent Annual Wellness Visit Medicare Wellness Visit Mode:: Telephone If telephone:: video declined Interpreter Needed?: No Pre-visit prep was  completed: no AWV questionnaire completed by patient prior to visit?: no Living arrangements:: lives with spouse/significant other Patient's Overall Health Status Rating: very good Typical amount of pain: none Does pain affect daily life?: no Are you currently prescribed opioids?: no  Dietary Habits and Nutritional Risks How many meals a day?: 3 Eats fruit and vegetables daily?: yes Most meals are obtained by: preparing own meals Diabetic:: (!) yes Any non-healing wounds?: no How often do you check your BS?: 2 Would you like to be referred to a Nutritionist or for Diabetic Management? : no  Functional Status Activities of Daily Living (to include ambulation/medication):  Independent Ambulation: Independent Medication Administration: Independent Home Management: Independent Manage your own finances?: yes Primary transportation is: driving Concerns about vision?: no *vision screening is required for WTM* Concerns about hearing?: (!) yes Uses hearing aids?: no Hear whispered voice?: (!) no *in-person visit only*  Fall Screening Falls in the past year?: 1 Number of falls in past year: 1 Was there an injury with Fall?: 1 Fall Risk Category Calculator: 3 Patient Fall Risk Level: High Fall Risk  Fall Risk Patient at Risk for Falls Due to: History of fall(s) Fall risk Follow up: Falls evaluation completed; Education provided; Falls prevention discussed  Home and Transportation Safety: All rugs have non-skid backing?: yes All stairs or steps have railings?: yes Grab bars in the bathtub or shower?: yes Have non-skid surface in bathtub or shower?: (!) no Good home lighting?: yes Regular seat belt use?: yes Hospital stays in the last year:: no  Cognitive Assessment Difficulty concentrating, remembering, or making decisions? : no Will 6CIT or Mini Cog be Completed: yes What year is it?: 0 points What month is it?: 0 points Give patient an address phrase to remember (5 components): Its very sunny outside today in November About what time is it?: 0 points Count backwards from 20 to 1: 0 points Say the months of the year in reverse: 0 points Repeat the address phrase from earlier: 0 points 6 CIT Score: 0 points  Advance Directives (For Healthcare) Does Patient Have a Medical Advance Directive?: Yes Does patient want to make changes to medical advance directive?: No - Patient declined Type of Advance Directive: Healthcare Power of Attorney Would patient like information on creating a medical advance directive?: No - Patient declined        Objective:    Today's Vitals   02/15/24 1135  Weight: 254 lb (115.2 kg)  Height: 6' (1.829 m)   Body  mass index is 34.45 kg/m.  Current Medications (verified) Outpatient Encounter Medications as of 02/15/2024  Medication Sig   Accu-Chek Softclix Lancets lancets Test blood sugar once daily. Dx E11.9   amLODipine  (NORVASC ) 2.5 MG tablet TAKE 1 TABLET EVERY DAY. ADD TO 5MG  TABLET FOR TOTAL 7.5MG  DOSE.   amLODipine  (NORVASC ) 5 MG tablet Take 1 tablet (5 mg total) by mouth daily. Combine with 2.5mg  for 7.5mg  dose.   atorvastatin  (LIPITOR) 10 MG tablet Take 1 tablet (10 mg total) by mouth daily.   blood glucose meter kit and supplies TrueMetrix, TruTrack, Accu-Chek Aviva Expert, Accu-Chek Aviva Plus, Accu-Chek Guide, Accu-Chek Nano, or Accu-Chek Smartview.   Use once per day.   carvedilol  (COREG ) 6.25 MG tablet TAKE 1 TABLET TWICE DAILY   clopidogrel  (PLAVIX ) 75 MG tablet Take 1 tablet (75 mg total) by mouth daily.   glucose blood (TRUE METRIX BLOOD GLUCOSE TEST) test strip 1 each by Other route 3 (three) times daily. Use as instructed  Lancets (ONETOUCH ULTRASOFT) lancets Use as instructed   latanoprost (XALATAN) 0.005 % ophthalmic solution Place 1 drop into both eyes at bedtime.   losartan  (COZAAR ) 100 MG tablet Take 1 tablet (100 mg total) by mouth daily.   metFORMIN  (GLUCOPHAGE ) 500 MG tablet TAKE 1 TABLET TWICE DAILY WITH MEALS   Multiple Vitamins-Minerals (MULTIVITAMIN WITH MINERALS) tablet Take 1 tablet by mouth daily.   Omega-3 Fatty Acids (FISH OIL) 1000 MG CAPS Take 1,000 mg by mouth daily.   TRUEplus Lancets 33G MISC 1 each by Does not apply route in the morning, at noon, and at bedtime.   No facility-administered encounter medications on file as of 02/15/2024.   Hearing/Vision screen Hearing Screening - Comments:: Waiting on hearing aids from the [place] Vision Screening - Comments:: Up to date  dunn Immunizations and Health Maintenance Health Maintenance  Topic Date Due   DTaP/Tdap/Td (2 - Td or Tdap) 05/02/2023   COVID-19 Vaccine (1 - 2025-26 season) 02/27/2024 (Originally  12/13/2023)   OPHTHALMOLOGY EXAM  06/30/2024   HEMOGLOBIN A1C  08/10/2024   Diabetic kidney evaluation - eGFR measurement  02/10/2025   Diabetic kidney evaluation - Urine ACR  02/10/2025   FOOT EXAM  02/10/2025   Medicare Annual Wellness (AWV)  02/14/2025   Pneumococcal Vaccine: 50+ Years  Completed   Influenza Vaccine  Completed   Hepatitis C Screening  Completed   Zoster Vaccines- Shingrix  Completed   Meningococcal B Vaccine  Aged Out   Colonoscopy  Discontinued        Assessment/Plan:  This is a routine wellness examination for [facility].  Patient Care Team: Levora Reyes SAUNDERS, MD as PCP - General (Family Medicine) Cindie Ole DASEN, MD as PCP - Electrophysiology (Cardiology) Floretta Mallard, MD as PCP - Cardiology (Cardiology)  I have personally reviewed and noted the following in the patient's chart:   Medical and social history Use of alcohol, tobacco or illicit drugs  Current medications and supplements including opioid prescriptions. Functional ability and status Nutritional status Physical activity Advanced directives List of other physicians Hospitalizations, surgeries, and ER visits in previous 12 months Vitals Screenings to include cognitive, depression, and falls Referrals and appointments  No orders of the defined types were placed in this encounter.  In addition, I have reviewed and discussed with patient certain preventive protocols, quality metrics, and best practice recommendations. A written personalized care plan for preventive services as well as general preventive health recommendations were provided to patient.   Gretchen Weinfeld, LPN   [id number]   Return in 1 year (on 02/14/2025).  After Visit Summary: (MyChart) Due to this being a telephonic visit, the after visit summary with patients personalized plan was offered to patient via MyChart   Nurse Notes:

## 2024-02-16 NOTE — Progress Notes (Signed)
 Visit Complete: Virtual I connected with this patient by a audio enabled telemedicine application and verified that I am speaking with the correct person using two identifiers.  Patient Location: Home Provider Location: home  I discussed the limitations of evaluation and management by telemedicine. The patient expressed understanding and agreed to proceed.  Persons Participating in Visit: Patient   Jeffery George

## 2024-02-17 ENCOUNTER — Ambulatory Visit: Payer: Self-pay | Admitting: Family Medicine

## 2024-02-25 ENCOUNTER — Other Ambulatory Visit: Payer: Self-pay | Admitting: Family Medicine

## 2024-02-25 DIAGNOSIS — E669 Obesity, unspecified: Secondary | ICD-10-CM

## 2024-02-25 DIAGNOSIS — I10 Essential (primary) hypertension: Secondary | ICD-10-CM

## 2024-02-28 NOTE — Progress Notes (Signed)
Patient has been scheduled for 12/19

## 2024-03-22 ENCOUNTER — Other Ambulatory Visit: Payer: Self-pay | Admitting: Family Medicine

## 2024-03-22 DIAGNOSIS — E785 Hyperlipidemia, unspecified: Secondary | ICD-10-CM

## 2024-03-22 DIAGNOSIS — I10 Essential (primary) hypertension: Secondary | ICD-10-CM

## 2024-03-31 ENCOUNTER — Ambulatory Visit: Admitting: Family Medicine

## 2024-03-31 ENCOUNTER — Encounter: Payer: Self-pay | Admitting: Family Medicine

## 2024-03-31 VITALS — BP 134/84 | HR 67 | Temp 98.0°F | Resp 18 | Ht 72.0 in | Wt 238.0 lb

## 2024-03-31 DIAGNOSIS — E119 Type 2 diabetes mellitus without complications: Secondary | ICD-10-CM

## 2024-03-31 DIAGNOSIS — I10 Essential (primary) hypertension: Secondary | ICD-10-CM

## 2024-03-31 DIAGNOSIS — E669 Obesity, unspecified: Secondary | ICD-10-CM

## 2024-03-31 NOTE — Progress Notes (Signed)
 "  Subjective:  Patient ID: Jeffery George, male    DOB: Jul 23, 1945  Age: 78 y.o. MRN: [id number]  CC:  Chief Complaint  Patient presents with   Follow-up    6 week, No concerns Fasting    HPI Jeffery George presents for   Follow-up from October 31 visit.  Diabetes: Discussed in October, did have some elevated readings prior to that visit but improved with dietary changes, and improved recent readings.  6-week follow-up discussed with possible fructosamine.  Home monitoring level reviewed.  Had been taking metformin  500 mg twice daily, on statin with Lipitor and ARB with losartan .  Diabetes complicated by obesity, hyperglycemia, microalbuminuria.  Also with history of CAD, TIA. Jardiance  cost prohibitive in 2023.   Since last visit: Checking blood sugars BID - fasting. No postprandials.  Am readings - 130-170 range, evening readings - 91-140's. Lower than am readings. No further 200's, no lows. Weight has improved. Intentional with diet changes.  Balance is better, no falls, more steady.    Lab Results  Component Value Date   HGBA1C 8.2 (H) 02/11/2024   HGBA1C 6.9 (H) 08/11/2023   HGBA1C 6.6 (H) 02/10/2023   Lab Results  Component Value Date   MICROALBUR 0.8 02/11/2024   LDLCALC 60 02/11/2024   CREATININE 0.89 02/11/2024   Wt Readings from Last 3 Encounters:  03/31/24 238 lb (108 kg)  02/15/24 254 lb (115.2 kg)  02/11/24 254 lb 9.6 oz (115.5 kg)    Lower home BP readings. 110-120/70 range. On amlodipine  total dose 7.5mg  dose.     History Patient Active Problem List   Diagnosis Date Noted   Central obesity 02/11/2024   Hearing loss 02/11/2024   Hypertension 02/11/2024   Shoulder joint pain 02/11/2024   Aneurysm of ascending aorta without rupture 02/09/2024   Hyperlipidemia 06/17/2022   Aortic valve stenosis 06/17/2022   History of COVID-19 05/08/2020   Cough 05/08/2020   Rotator cuff syndrome, left 11/15/2018   Left retinal detachment 08/11/2017   Essential  hypertension, benign 02/27/2013   Type 2 diabetes mellitus in patient with obesity (HCC) 02/27/2013   Past Medical History:  Diagnosis Date   Arthritis    Diabetes mellitus without complication (HCC)    Heart murmur    Hypertension    Past Surgical History:  Procedure Laterality Date   CATARACT EXTRACTION     CORONARY PRESSURE/FFR STUDY N/A 11/25/2023   Procedure: CORONARY PRESSURE/FFR STUDY;  Surgeon: Elmira Newman PARAS, MD;  Location: [facility];  Service: Cardiovascular;  Laterality: N/A;   FRACTURE SURGERY     Lt ankle   GAS/FLUID EXCHANGE Left 08/12/2017   Procedure: C3F8 GAS/FLUID EXCHANGE;  Surgeon: Valdemar Rogue, MD;  Location: [facility] OR;  Service: Ophthalmology;  Laterality: Left;   HERNIA REPAIR     IRIDOTOMY / IRIDECTOMY Bilateral    stents   LEFT HEART CATH AND CORONARY ANGIOGRAPHY N/A 11/25/2023   Procedure: LEFT HEART CATH AND CORONARY ANGIOGRAPHY;  Surgeon: Elmira Newman PARAS, MD;  Location: [facility];  Service: Cardiovascular;  Laterality: N/A;   VASECTOMY     VITRECTOMY 25 GAUGE WITH SCLERAL BUCKLE Left 08/12/2017   Procedure: VITRECTOMY 25 GAUGE WITH SCLERAL BUCKLE WITH ENDOLASER;  Surgeon: Valdemar Rogue, MD;  Location: [facility] OR;  Service: Ophthalmology;  Laterality: Left;   Allergies[1] Prior to Admission medications  Medication Sig Start Date End Date Taking? Authorizing Provider  Accu-Chek Softclix Lancets lancets Test blood sugar once daily. Dx E11.9 12/10/21  Yes Levora Reyes SAUNDERS, MD  amLODipine  (NORVASC ) 2.5 MG tablet TAKE 1 TABLET EVERY DAY 02/25/24  Yes Levora Reyes SAUNDERS, MD  amLODipine  (NORVASC ) 5 MG tablet Take 1 tablet (5 mg total) by mouth daily. Combine with 2.5mg  for 7.5mg  dose. 01/13/24  Yes Levora Reyes SAUNDERS, MD  atorvastatin  (LIPITOR) 10 MG tablet TAKE 1 TABLET EVERY DAY 03/22/24  Yes Levora Reyes SAUNDERS, MD  blood glucose meter kit and supplies TrueMetrix, TruTrack, Accu-Chek Aviva Expert, Accu-Chek Aviva Plus, Accu-Chek Guide, Accu-Chek  Nano, or Accu-Chek Smartview.   Use once per day. 07/31/17  Yes Levora Reyes SAUNDERS, MD  carvedilol  (COREG ) 6.25 MG tablet TAKE 1 TABLET TWICE DAILY 02/03/24  Yes Levora Reyes SAUNDERS, MD  clopidogrel  (PLAVIX ) 75 MG tablet Take 1 tablet (75 mg total) by mouth daily. 02/10/23  Yes Levora Reyes SAUNDERS, MD  glucose blood (TRUE METRIX BLOOD GLUCOSE TEST) test strip TEST BLOOD SUGAR THREE TIMES DAILY AS INSTRUCTED 02/25/24  Yes Levora Reyes SAUNDERS, MD  Lancets [facility] ULTRASOFT) lancets Use as instructed 06/05/13  Yes Levora Reyes SAUNDERS, MD  latanoprost (XALATAN) 0.005 % ophthalmic solution Place 1 drop into both eyes at bedtime. 07/23/17  Yes [provider]  losartan  (COZAAR ) 100 MG tablet Take 1 tablet (100 mg total) by mouth daily. 02/25/24  Yes Levora Reyes SAUNDERS, MD  metFORMIN  (GLUCOPHAGE ) 500 MG tablet TAKE 1 TABLET TWICE DAILY WITH MEALS 01/12/24  Yes Levora Reyes SAUNDERS, MD  Multiple Vitamins-Minerals (MULTIVITAMIN WITH MINERALS) tablet Take 1 tablet by mouth daily.   Yes [provider]  Omega-3 Fatty Acids (FISH OIL) 1000 MG CAPS Take 1,000 mg by mouth daily.   Yes [provider]  TRUEplus Lancets 33G MISC 1 each by Does not apply route in the morning, at noon, and at bedtime. 08/05/21  Yes Kip Ade, NP   Social History   Socioeconomic History   Marital status: Married    Spouse name: Not on file   Number of children: Not on file   Years of education: Not on file   Highest education level: Some college, no degree  Occupational History   Occupation: Pharmacologist  Tobacco Use   Smoking status: Former   Smokeless tobacco: Never  Advertising Account Planner   Vaping status: Never Used  Substance and Sexual Activity   Alcohol use: No    Alcohol/week: 0.0 standard drinks of alcohol   Drug use: No   Sexual activity: Never  Other Topics Concern   Not on file  Social History Narrative   Married   Science Writer at [employer]   Social Drivers of Health   Tobacco  Use: Medium Risk (03/31/2024)   Patient History    Smoking Tobacco Use: Former    Smokeless Tobacco Use: Never    Passive Exposure: Not on file  Financial Resource Strain: Medium Risk (02/14/2024)   Overall Financial Resource Strain (CARDIA)    Difficulty of Paying Living Expenses: Somewhat hard  Food Insecurity: Food Insecurity Present (02/15/2024)   Epic    Worried About Programme Researcher, Broadcasting/film/video in the Last Year: Sometimes true    Ran Out of Food in the Last Year: Never true  Transportation Needs: No Transportation Needs (02/15/2024)   Epic    Lack of Transportation (Medical): No    Lack of Transportation (Non-Medical): No  Physical Activity: Inactive (02/15/2024)   Exercise Vital Sign    Days of Exercise per Week: 0 days    Minutes of Exercise per Session:  0 min  Stress: No Stress Concern Present (02/15/2024)   [employer] of Occupational Health - Occupational Stress Questionnaire    Feeling of Stress: Not at all  Social Connections: Moderately Isolated (02/15/2024)   Social Connection and Isolation Panel    Frequency of Communication with Friends and Family: Three times a week    Frequency of Social Gatherings with Friends and Family: Once a week    Attends Religious Services: Never    Database Administrator or Organizations: No    Attends Banker Meetings: Never    Marital Status: Married  Catering Manager Violence: Not At Risk (02/15/2024)   Epic    Fear of Current or Ex-Partner: No    Emotionally Abused: No    Physically Abused: No    Sexually Abused: No  Depression (PHQ2-9): Low Risk (02/15/2024)   Depression (PHQ2-9)    PHQ-2 Score: 2  Recent Concern: Depression (PHQ2-9) - Medium Risk (02/11/2024)   Depression (PHQ2-9)    PHQ-2 Score: 10  Alcohol Screen: Low Risk (02/11/2023)   Alcohol Screen    Last Alcohol Screening Score (AUDIT): 0  Housing: High Risk (02/15/2024)   Epic    Unable to Pay for Housing in the Last Year: Yes    Number of Times Moved in  the Last Year: 0    Homeless in the Last Year: No  Utilities: Not At Risk (02/15/2024)   Epic    Threatened with loss of utilities: No  Health Literacy: Adequate Health Literacy (02/15/2024)   B1300 Health Literacy    Frequency of need for help with medical instructions: Never    Review of Systems Per HPI.   Objective:   Vitals:   03/31/24 1050  BP: 134/84  Pulse: 67  Resp: 18  Temp: 98 F (36.7 C)  TempSrc: Oral  SpO2: 97%  Weight: 238 lb (108 kg)  Height: 6' (1.829 m)     Physical Exam Vitals reviewed.  Constitutional:      Appearance: He is well-developed.  HENT:     Head: Normocephalic and atraumatic.  Neck:     Vascular: No carotid bruit or JVD.  Cardiovascular:     Rate and Rhythm: Normal rate and regular rhythm.     Heart sounds: Normal heart sounds. No murmur heard. Pulmonary:     Effort: Pulmonary effort is normal.     Breath sounds: Normal breath sounds. No rales.  Musculoskeletal:     Right lower leg: No edema.     Left lower leg: No edema.  Skin:    General: Skin is warm and dry.  Neurological:     Mental Status: He is alert and oriented to person, place, and time.  Psychiatric:        Mood and Affect: Mood normal.        Assessment & Plan:  Jeffery George is a 78 y.o. male . Type 2 diabetes mellitus in patient with obesity (HCC) - Plan: Fructosamine  - Commended on weight improvements, home blood sugar readings are improving.  Could consider adding low-dose SGLT2 but cost prohibitive previously.  Check fructosamine, 6-week follow-up.  No new meds for now.  Essential hypertension, benign  - Improved on home readings, lower than in office, no changes in meds for now.  6-week follow-up.  No orders of the defined types were placed in this encounter.  Patient Instructions  [place] work on the weight improvements, dietary changes and blood sugars look like they are doing  better.  I am checking a blood test called fructosamine today.  We do have  the option of adding another medication like Jardiance  again but lets see what the levels look like from labs today and then again in 6 weeks.  No changes for now.  Continue to monitor blood sugars, blood pressures and if any high readings let me know.  Take care!    Signed,   Reyes Pines, MD [facility], [facility] 03/31/2024 11:29 AM       [1] No Known Allergies  "

## 2024-03-31 NOTE — Patient Instructions (Signed)
 Great work on the weight improvements, dietary changes and blood sugars look like they are doing better.  I am checking a blood test called fructosamine today.  We do have the option of adding another medication like Jardiance  again but lets see what the levels look like from labs today and then again in 6 weeks.  No changes for now.  Continue to monitor blood sugars, blood pressures and if any high readings let me know.  Take care!

## 2024-04-08 LAB — FRUCTOSAMINE: Fructosamine: 266 umol/L (ref 205–285)

## 2024-04-09 ENCOUNTER — Ambulatory Visit: Payer: Self-pay | Admitting: Family Medicine

## 2024-04-09 ENCOUNTER — Other Ambulatory Visit: Payer: Self-pay | Admitting: Family Medicine

## 2024-04-09 DIAGNOSIS — G459 Transient cerebral ischemic attack, unspecified: Secondary | ICD-10-CM

## 2024-05-17 ENCOUNTER — Ambulatory Visit: Admitting: Family Medicine

## 2024-05-24 ENCOUNTER — Ambulatory Visit (INDEPENDENT_AMBULATORY_CARE_PROVIDER_SITE_OTHER): Admitting: Otolaryngology

## 2024-06-14 ENCOUNTER — Ambulatory Visit: Admitting: Family Medicine

## 2024-08-11 ENCOUNTER — Ambulatory Visit: Admitting: Family Medicine

## 2025-01-18 ENCOUNTER — Ambulatory Visit (HOSPITAL_COMMUNITY)
